# Patient Record
Sex: Male | Born: 1940 | Race: White | Hispanic: No | Smoking: Former smoker
Health system: Southern US, Community
[De-identification: ages and names within clinical notes are randomized; demographics above are authoritative.]

## PROBLEM LIST (undated history)

## (undated) DIAGNOSIS — I2510 Atherosclerotic heart disease of native coronary artery without angina pectoris: Secondary | ICD-10-CM

## (undated) DIAGNOSIS — I208 Other forms of angina pectoris: Secondary | ICD-10-CM

## (undated) DIAGNOSIS — E785 Hyperlipidemia, unspecified: Secondary | ICD-10-CM

## (undated) DIAGNOSIS — I2089 Other forms of angina pectoris: Secondary | ICD-10-CM

## (undated) DIAGNOSIS — I10 Essential (primary) hypertension: Secondary | ICD-10-CM

## (undated) DIAGNOSIS — Z8249 Family history of ischemic heart disease and other diseases of the circulatory system: Secondary | ICD-10-CM

## (undated) DIAGNOSIS — G4733 Obstructive sleep apnea (adult) (pediatric): Secondary | ICD-10-CM

## (undated) DIAGNOSIS — E119 Type 2 diabetes mellitus without complications: Secondary | ICD-10-CM

## (undated) HISTORY — DX: Atherosclerotic heart disease of native coronary artery without angina pectoris: I25.10

## (undated) HISTORY — DX: Hyperlipidemia, unspecified: E78.5

## (undated) HISTORY — DX: Essential (primary) hypertension: I10

## (undated) HISTORY — DX: Other forms of angina pectoris: I20.89

## (undated) HISTORY — DX: Type 2 diabetes mellitus without complications: E11.9

## (undated) HISTORY — DX: Family history of ischemic heart disease and other diseases of the circulatory system: Z82.49

## (undated) HISTORY — DX: Obstructive sleep apnea (adult) (pediatric): G47.33

## (undated) HISTORY — DX: Other forms of angina pectoris: I20.8

## (undated) HISTORY — PX: HERNIA REPAIR: SHX51

---
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
## 1967-06-24 HISTORY — PX: APPENDECTOMY: SHX54

## 1983-06-24 HISTORY — PX: CHOLECYSTECTOMY: SHX55

## 1999-02-18 ENCOUNTER — Encounter: Payer: Self-pay | Admitting: Cardiology

## 1999-02-18 ENCOUNTER — Ambulatory Visit (HOSPITAL_COMMUNITY): Admission: RE | Admit: 1999-02-18 | Discharge: 1999-02-18 | Payer: Self-pay | Admitting: Cardiology

## 1999-08-31 ENCOUNTER — Encounter: Payer: Self-pay | Admitting: Emergency Medicine

## 1999-08-31 ENCOUNTER — Emergency Department (HOSPITAL_COMMUNITY): Admission: EM | Admit: 1999-08-31 | Discharge: 1999-08-31 | Payer: Self-pay | Admitting: Emergency Medicine

## 2002-06-23 HISTORY — PX: TOTAL KNEE ARTHROPLASTY: SHX125

## 2005-01-27 HISTORY — PX: CARDIAC CATHETERIZATION: SHX172

## 2007-07-25 ENCOUNTER — Inpatient Hospital Stay (HOSPITAL_COMMUNITY): Admission: EM | Admit: 2007-07-25 | Discharge: 2007-07-26 | Payer: Self-pay | Admitting: Emergency Medicine

## 2008-02-18 ENCOUNTER — Ambulatory Visit: Payer: Self-pay | Admitting: Family Medicine

## 2008-02-21 ENCOUNTER — Encounter: Admission: RE | Admit: 2008-02-21 | Discharge: 2008-02-21 | Payer: Self-pay | Admitting: Family Medicine

## 2008-03-29 ENCOUNTER — Ambulatory Visit: Payer: Self-pay | Admitting: Family Medicine

## 2008-04-06 ENCOUNTER — Encounter (INDEPENDENT_AMBULATORY_CARE_PROVIDER_SITE_OTHER): Payer: Self-pay | Admitting: *Deleted

## 2008-04-12 ENCOUNTER — Ambulatory Visit: Payer: Self-pay | Admitting: Family Medicine

## 2008-04-12 ENCOUNTER — Ambulatory Visit: Payer: Self-pay | Admitting: Gastroenterology

## 2008-04-12 ENCOUNTER — Encounter: Payer: Self-pay | Admitting: Gastroenterology

## 2008-04-12 DIAGNOSIS — I2510 Atherosclerotic heart disease of native coronary artery without angina pectoris: Secondary | ICD-10-CM | POA: Insufficient documentation

## 2008-04-12 DIAGNOSIS — Z860100 Personal history of colon polyps, unspecified: Secondary | ICD-10-CM | POA: Insufficient documentation

## 2008-04-12 DIAGNOSIS — K432 Incisional hernia without obstruction or gangrene: Secondary | ICD-10-CM | POA: Insufficient documentation

## 2008-04-12 DIAGNOSIS — K859 Acute pancreatitis without necrosis or infection, unspecified: Secondary | ICD-10-CM

## 2008-04-13 ENCOUNTER — Ambulatory Visit (HOSPITAL_COMMUNITY): Admission: RE | Admit: 2008-04-13 | Discharge: 2008-04-13 | Payer: Self-pay | Admitting: Gastroenterology

## 2008-04-13 ENCOUNTER — Encounter: Payer: Self-pay | Admitting: Gastroenterology

## 2008-04-20 ENCOUNTER — Encounter: Payer: Self-pay | Admitting: Gastroenterology

## 2008-04-20 ENCOUNTER — Ambulatory Visit: Payer: Self-pay | Admitting: Gastroenterology

## 2008-04-21 ENCOUNTER — Encounter: Payer: Self-pay | Admitting: Gastroenterology

## 2008-04-27 ENCOUNTER — Inpatient Hospital Stay (HOSPITAL_COMMUNITY): Admission: RE | Admit: 2008-04-27 | Discharge: 2008-04-28 | Payer: Self-pay | Admitting: General Surgery

## 2008-04-30 ENCOUNTER — Emergency Department (HOSPITAL_COMMUNITY): Admission: EM | Admit: 2008-04-30 | Discharge: 2008-05-01 | Payer: Self-pay | Admitting: Emergency Medicine

## 2008-05-03 ENCOUNTER — Inpatient Hospital Stay (HOSPITAL_COMMUNITY): Admission: EM | Admit: 2008-05-03 | Discharge: 2008-05-08 | Payer: Self-pay | Admitting: Emergency Medicine

## 2008-05-03 HISTORY — PX: CARDIAC CATHETERIZATION: SHX172

## 2008-06-29 ENCOUNTER — Encounter (HOSPITAL_COMMUNITY): Admission: RE | Admit: 2008-06-29 | Discharge: 2008-09-27 | Payer: Self-pay | Admitting: Cardiology

## 2008-08-10 ENCOUNTER — Ambulatory Visit: Payer: Self-pay | Admitting: Family Medicine

## 2009-03-15 ENCOUNTER — Ambulatory Visit: Payer: Self-pay | Admitting: Family Medicine

## 2009-06-07 ENCOUNTER — Emergency Department (HOSPITAL_COMMUNITY): Admission: EM | Admit: 2009-06-07 | Discharge: 2009-06-07 | Payer: Self-pay | Admitting: Emergency Medicine

## 2009-06-08 ENCOUNTER — Emergency Department (HOSPITAL_COMMUNITY): Admission: EM | Admit: 2009-06-08 | Discharge: 2009-06-08 | Payer: Self-pay | Admitting: Emergency Medicine

## 2009-07-16 HISTORY — PX: CARDIOVASCULAR STRESS TEST: SHX262

## 2010-08-21 ENCOUNTER — Emergency Department (HOSPITAL_COMMUNITY): Payer: Medicare Other

## 2010-08-21 ENCOUNTER — Inpatient Hospital Stay (HOSPITAL_COMMUNITY)
Admission: EM | Admit: 2010-08-21 | Discharge: 2010-08-22 | DRG: 247 | Disposition: A | Discharge status: Home / Self Care | Payer: Medicare Other | Attending: Cardiovascular Disease | Admitting: Cardiovascular Disease

## 2010-08-21 DIAGNOSIS — I10 Essential (primary) hypertension: Secondary | ICD-10-CM | POA: Diagnosis present

## 2010-08-21 DIAGNOSIS — I200 Unstable angina: Principal | ICD-10-CM | POA: Diagnosis present

## 2010-08-21 DIAGNOSIS — E119 Type 2 diabetes mellitus without complications: Secondary | ICD-10-CM | POA: Diagnosis present

## 2010-08-21 DIAGNOSIS — E785 Hyperlipidemia, unspecified: Secondary | ICD-10-CM | POA: Diagnosis present

## 2010-08-21 DIAGNOSIS — I2510 Atherosclerotic heart disease of native coronary artery without angina pectoris: Secondary | ICD-10-CM | POA: Diagnosis present

## 2010-08-21 HISTORY — PX: CARDIAC CATHETERIZATION: SHX172

## 2010-08-21 LAB — CBC
MCH: 31 pg (ref 26.0–34.0)
Platelets: 287 10*3/uL (ref 150–400)
RBC: 5.38 MIL/uL (ref 4.22–5.81)
WBC: 7.6 10*3/uL (ref 4.0–10.5)

## 2010-08-21 LAB — POCT I-STAT, CHEM 8
Calcium, Ion: 1.28 mmol/L (ref 1.12–1.32)
HCT: 49 % (ref 39.0–52.0)
Hemoglobin: 16.7 g/dL (ref 13.0–17.0)
TCO2: 21 mmol/L (ref 0–100)

## 2010-08-21 LAB — DIFFERENTIAL
Basophils Absolute: 0 10*3/uL (ref 0.0–0.1)
Basophils Relative: 0 % (ref 0–1)
Eosinophils Absolute: 0.2 10*3/uL (ref 0.0–0.7)
Monocytes Relative: 5 % (ref 3–12)
Neutrophils Relative %: 77 % (ref 43–77)

## 2010-08-21 LAB — CK TOTAL AND CKMB (NOT AT ARMC)
Relative Index: INVALID (ref 0.0–2.5)
Total CK: 89 U/L (ref 7–232)

## 2010-08-21 LAB — TROPONIN I: Troponin I: 0.01 ng/mL (ref 0.00–0.06)

## 2010-08-21 LAB — HEPARIN LEVEL (UNFRACTIONATED): Heparin Unfractionated: 0.54 IU/mL (ref 0.30–0.70)

## 2010-08-21 LAB — CARDIAC PANEL(CRET KIN+CKTOT+MB+TROPI)
Relative Index: 1.6 (ref 0.0–2.5)
Total CK: 239 U/L — ABNORMAL HIGH (ref 7–232)
Troponin I: 0.02 ng/mL (ref 0.00–0.06)

## 2010-08-21 LAB — POCT CARDIAC MARKERS: Troponin i, poc: <0.05 ng/mL (ref 0.00–0.09)

## 2010-08-21 LAB — HEMOGLOBIN A1C: Mean Plasma Glucose: 163 mg/dL — ABNORMAL HIGH (ref <117)

## 2010-08-21 LAB — MAGNESIUM: Magnesium: 1.9 mg/dL (ref 1.5–2.5)

## 2010-08-22 LAB — CBC
Hemoglobin: 15.4 g/dL (ref 13.0–17.0)
MCH: 31.2 pg (ref 26.0–34.0)
MCV: 88.8 fL (ref 78.0–100.0)
RBC: 4.93 MIL/uL (ref 4.22–5.81)

## 2010-08-22 LAB — BASIC METABOLIC PANEL
BUN: 15 mg/dL (ref 6–23)
CO2: 25 mEq/L (ref 19–32)
Chloride: 102 mEq/L (ref 96–112)
Creatinine, Ser: 1.3 mg/dL (ref 0.4–1.5)

## 2010-08-22 LAB — LIPID PANEL
Total CHOL/HDL Ratio: 6.2 RATIO
VLDL: 53 mg/dL — ABNORMAL HIGH (ref 0–40)

## 2010-09-04 NOTE — Procedures (Signed)
NAME:  Joel Stanley, Joel Stanley NO.:  [id number]  MEDICAL RECORD NO.:  [id number]           PATIENT TYPE:  I  LOCATION:  2920                         FACILITY:  [facility]  PHYSICIAN:  Nanetta Batty, M.D.   DATE OF BIRTH:  1940/12/25  DATE OF PROCEDURE: DATE OF DISCHARGE:                           CARDIAC CATHETERIZATION   NAME OF PROCEDURE:  Cardiac catheterization/cutting balloon atherectomy, PCI and stenting.  Joel Stanley is a 70 year old gentleman with history of CAD with a known totally occluded LAD in the past.  He has had PLA stenting several years ago by Dr. Lavonne Chick.  He was admitted with unstable angina.  No acute EKG changes.  He was placed on IV nitro and presents now for diagnostic coronary arteriography to define his anatomy and rule out ischemic etiology.  DESCRIPTION OF PROCEDURE:  The patient was brought to the second floor [place] cardiac cath lab in a postabsorptive state.  He was premedicated with p.o. Valium.  His right groin was prepped and shaved in the usual sterile fashion.  Xylocaine 1% was used for local anesthesia.  A 5, upgraded to a 6-French sheath was inserted in the right femoral artery using standard Seldinger technique.  A 5-French right and left Judkins diagnostic catheter as well as 5-French pigtail catheter were used for selective coronary angiography, left ventriculography, and distal abdominal aortography.  Visipaque dye was used for the entirety of the case.  Retrograde aorta, left ventricular, and pullback pressures were recorded.  HEMODYNAMICS: 1. Aortic systolic pressure 126, diastolic pressure 80. 2. Left ventricular systolic pressure 120 and diastolic pressure 11.  SELECTIVE CORONARY ANGIOGRAPHY: 1. Left main normal; the LAD was totally occluded at its midportion     and reconstituted distally by homo-collaterals. 2. Left circumflex; nondominant and free of significant disease. 3. Ramus branch; moderate-to-large in  size and free of significant     disease. 4. Right coronary artery; dominant with a patent PLA stent.  There was     a 75% fairly focal in-stent restenosis within its midportion.  The     PDA had a fairly focal 90% proximal stenosis. 5. Left ventriculography; RAO left ventriculogram was performed using     25 mL of Visipaque dye at 12 mL per second.  The overall LVEF was     estimated at approximately 45% with moderate inferobasal     hypokinesia. 6. Distal abdominal aortography; distal abdominal aortogram was     performed using 20 mL of Visipaque dye at 20 mL per second.  The     renal arteries were widely patent.  The infrarenal abdominal aorta     and iliac bifurcation appear free of significant atherosclerotic     changes.  IMPRESSION:  Joel Stanley has a focal area of in-stent restenosis within the PLA stent and a new PDA lesion.  We will proceed with cutting balloon atherectomy of the in-stent restenosis and stenting of the PDA.  DESCRIPTION OF PROCEDURE:  The 5-French sheath was upgraded to a 6- [place] sheath over a wire.  The patient was on aspirin and Plavix, received an additional 300 mg of p.o. Plavix,  20 mg of Pepcid, and Angiomax bolus with an ACT of 470.  Using a 6-French JR-4 guide along with 0.014 x 190 Prowater and a 2510 angioscope atherectomy device, cutting balloon atherectomy was performed of the PLA area of in-stent restenosis.  This was expanded to 10 atmospheres, resulting in reduction of a 75% to less than 20% without dissection.  The wire was then withdrawn and redirected down the PDA.  A Promus Elment stent was unable to pass the mid right coronary artery, and therefore, a sized soft wire was then placed in the PLA branch and used a "a buddy wire."  Following this, a 2.25 x 12  Promus Element stent was then advanced without difficulty into the PDA branch after predilatation with a 2010 FireStar.  This was then deployed at 14 atmospheres (2.36 mm,  resulting in reduction of a 90% blockage to 0% residual).  The patient tolerated the procedure well.  There were no hemodynamic or electrocardiographic sequelae.  The guidewire and catheters were removed, and the sheath was then sewn securely in place.  The patient left the lab in stable condition.  OVERALL IMPRESSION:  Successful cutting balloon atherectomy of the PLA area of "in-stent restenosis" and PCI and stenting of a new PDA lesion using a Promus Element drug-eluting stent and Angiomax.  Patient tolerated the procedure well.  He will be hydrated gently overnight, discharged home in the morning on aspirin and Plavix, and follow up with Dr. Clarene Duke.  He left the lab in stable condition.     Nanetta Batty, M.D.     JB/MEDQ  D:  08/21/2010  T:  08/22/2010  Job:  [id number]  cc:   Thereasa Solo. Little, M.D. Cardiac Cath Lab, II Floor [facility]  Electronically Signed by Nanetta Batty M.D. on 09/04/2010 01:58:09 PM

## 2010-09-04 NOTE — Discharge Summary (Signed)
NAME:  Joel Stanley, Joel Stanley NO.:  [id number]  MEDICAL RECORD NO.:  [id number]           PATIENT TYPE:  I  LOCATION:  2920                         FACILITY:  [facility]  PHYSICIAN:  Nanetta Batty, M.D.   DATE OF BIRTH:  02/01/1941  DATE OF ADMISSION:  08/21/2010 DATE OF DISCHARGE:  08/22/2010                              DISCHARGE SUMMARY   DISCHARGE DIAGNOSES: 1. Unstable angina with negative myocardial infarction. 2. 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 The patient's EF was 45%. 1. Left ventricular dysfunction, EF 45%. 2. Hypertension controlled. 3. Dyslipidemia, stable. 4. Diet-controlled diabetes mellitus type 2.  HISTORY OF PRESENT ILLNESS:  A 70 year old male presented to the emergency room on August 21, 2010, after he developed chest pain the night previous around 9:30.  He had eaten vegetable soup.  Originally, the pain was a 7/10 on a 1-10 scale in intensity, radiated to the right side of his neck and was eased with sublingual nitro, though initially took 10-15 Tums with no relief.  He did state it was similar to previous cardiac pain.  He did note nausea, vomiting, and diaphoresis, and had cold symptoms recently and also occasional right lower extremity edema. He had also had increasing anginal symptoms in the last 6 months prior to the admission.  When he came to the emergency room, was started on IV fluids, IV nitro, IV heparin, and continued on his 75 mg of Plavix daily.  He was admitted to step down and then underwent left heart cath with Dr. Nanetta Batty, and was found to have a new stenosis in the PDA undergoing PTCA with a drug-eluting stent and in-stent restenosis to the previously placed Promus drug-eluting stent to the PLA undergoing cutting  balloon atherectomy.  By the next morning, the patient is stable.  He has no complaints.  Cardiac enzymes were negative.  And once he ambulates with cardiac rehab, he will be discharged home, and follow up with Dr. Clarene Duke.  DISCHARGE MEDICATIONS:  See medication reconciliation sheet.  The only new medications added nitroglycerin sublingual and the Pepcid 20 mg.  DISCHARGE INSTRUCTIONS: 1. Increase activity slowly.  May shower.  No lifting for 3 days.  No     driving for 2 days.  Low-sodium heart-healthy diabetic diet. 2. Wash cath site with soap and water.  Call if any bleeding,     swelling, or drainage. 3. Follow up with Dr. Clarene Duke on September 06, 2010, at 9:00 a.m.  LABORATORY DATA:  Hemoglobin at discharge 15.4, hematocrit 43.8, WBC 7.8, platelets 247.  Protime was 13.4, INR of 1, and heparin was 0.54 and was on heparin.  Glucose had been elevated at 178.  Chemistry, sodium 136, potassium 4, chloride 102, CO2 of 25, glucose 170, BUN 15, creatinine 1.30, calcium 9.1, magnesium 1.9.  Hemoglobin A1c was 7.3.  CK range was elevated.  The MBs and the troponins  were all negative.  CK ranged 89-267, but MBs 1.7, 2.8, and 2.6, troponin I was 0.01-0.02.  Total cholesterol 174, triglycerides 264, HDL 28, LDL 93.  TSH 0.729 and MRSA screen was negative.  RADIOLOGY:  Chest x-ray, cardiomegaly, bibasilar atelectasis, stable chronic interstitial lung disease.  EKG on August 21, 2010, sinus rhythm, rate 83, and also interventricular conduction delay.  Follow up on August 22, 2010, sinus rhythm, rate of 75, minimal voltage criteria for LVH, otherwise no acute changes.     Darcella Gasman. Annie Paras, N.P.   ______________________________ Nanetta Batty, M.D.    LRI/MEDQ  D:  08/22/2010  T:  08/23/2010  Job:  [id number]  cc:   [facility] Sharlot Gowda, M.D.  Electronically Signed by Nada Boozer N.P. on 08/23/2010 05:05:37 PM Electronically Signed by Nanetta Batty M.D. on 09/04/2010  01:58:06 PM

## 2010-09-23 LAB — CBC
Hemoglobin: 16.2 g/dL (ref 13.0–17.0)
RBC: 5.27 MIL/uL (ref 4.22–5.81)
RDW: 12.4 % (ref 11.5–15.5)

## 2010-09-23 LAB — PROTIME-INR: INR: 0.99 (ref 0.00–1.49)

## 2010-09-23 LAB — DIFFERENTIAL
Basophils Absolute: 0 10*3/uL (ref 0.0–0.1)
Lymphocytes Relative: 25 % (ref 12–46)
Monocytes Absolute: 0.5 10*3/uL (ref 0.1–1.0)
Monocytes Relative: 6 % (ref 3–12)
Neutro Abs: 4.5 10*3/uL (ref 1.7–7.7)

## 2010-10-03 LAB — GLUCOSE, CAPILLARY: Glucose-Capillary: 104 mg/dL — ABNORMAL HIGH (ref 70–99)

## 2010-10-07 LAB — GLUCOSE, CAPILLARY
Glucose-Capillary: 208 mg/dL — ABNORMAL HIGH (ref 70–99)
Glucose-Capillary: 214 mg/dL — ABNORMAL HIGH (ref 70–99)
Glucose-Capillary: 273 mg/dL — ABNORMAL HIGH (ref 70–99)
Glucose-Capillary: 287 mg/dL — ABNORMAL HIGH (ref 70–99)

## 2010-10-08 LAB — GLUCOSE, CAPILLARY
Glucose-Capillary: 128 mg/dL — ABNORMAL HIGH (ref 70–99)
Glucose-Capillary: 164 mg/dL — ABNORMAL HIGH (ref 70–99)
Glucose-Capillary: 204 mg/dL — ABNORMAL HIGH (ref 70–99)
Glucose-Capillary: 207 mg/dL — ABNORMAL HIGH (ref 70–99)
Glucose-Capillary: 247 mg/dL — ABNORMAL HIGH (ref 70–99)

## 2010-11-05 NOTE — Cardiovascular Report (Signed)
NAMEMarland Kitchen  ROBEL, WUERTZ NO.:  [id number]   MEDICAL RECORD NO.:  [id number]          PATIENT TYPE:  INP   LOCATION:  2907                         FACILITY:  [facility]   PHYSICIAN:  Madaline Savage, M.D.DATE OF BIRTH:  July 19, 1940   DATE OF PROCEDURE:  05/03/2008  DATE OF DISCHARGE:                            CARDIAC CATHETERIZATION   PROCEDURES PERFORMED:  1. Selective coronary angiography by Judkins technique.  2. Retrograde left heart catheterization.  3. Left ventricular angiography.  4. Intracoronary artery balloon angioplasty followed by stenting of      the proximal and mid posterolateral branch of the right coronary      artery.   COMPLICATIONS:  None.   ENTRY SITE:  Right femoral.   DYE USED:  Omnipaque.   MEDICATIONS GIVEN:  The patient was given Plavix during the case and the  majority of the case was performed with Angiomax.  We later started  Integrilin because we then planned to continue Plavix for an additional  12 hours.   PATIENT PROFILE:  The patient is a 70 year old cardiology patient of Dr.  Julieanne Manson, who recently had need for abdominal surgery for hernia.  His surgeon got clearance for him to have the procedure done and his  Plavix was held.  The patient has a history of having had a Taxus stent  3 years ago.  Today, the patient developed an acute ST-segment elevation  MI and presented to our emergency room and was brought to the cath lab  emergently for intervention.  During the procedure, we encountered a  number of technical difficulties because of the anatomy of the RCA, but  we ultimately achieved a very satisfactory result, more discussion  below.   PRESSURES:  Left ventricular pressure was 112/60, end-diastolic pressure  12.  Central aortic pressure 115/71 with a mean of 91.   ANGIOGRAPHIC RESULTS:  The left main coronary artery is a medium-sized  vessel with no significant stenoses, normal in appearance.   Left anterior  descending coronary artery is occluded just distal to a  large septal perforator branch, and there is collateral flow into the  distal LAD by way of multiple branches including diagonal collateral  flow and probably some circumflex obtuse marginal branch collateral  flow.   The circumflex coronary artery is basically a nondominant vessel.  The  circumflex is a nondominant vessel.  It gives rise to one obtuse  marginal branch fairly proximally, an atrial circumflex branch, and a  small distal posterolateral branch.  There are lumpy bumpy  irregularities throughout this vessel, but none that are hemodynamically  significant.   I failed to mention that the patient has an intermediate ramus versus  optional diagonal branch coming off the proximal LAD before a septal  perforator branch #1.   The RCA is a large and dominant vessel with circulation, proximally it  is 5 mm in diameter in the downgoing limb of it where there is a  radiopaque stent adjacent to the first acute marginal branch, there is a  vessel appears to be about 4.5 mm in diameter and the stents that are  placed there are widely patent.  There is another stent that will be in  the proximal portion of the distal RCA that is patent, but unfortunately  there has been jailing and/or acute thrombosis of a posterolateral  branch arising from the distal bifurcation of the RCA.   Left ventricular angiography was done at the end of the case and showed  fairly well-preserved LV systolic function with anterior wall motion  being normal, apical wall motion being normal, and inferoapical wall  motion being good.  There is a definite area two-thirds the profile of  the inferior wall that shows hypokinesis of moderate-to-severe degree,  which would represent the myocardial infarction the patient suffered  today.  I do not see any mitral regurgitation.   The percutaneous intervention on this vessel was a complicated to fair  that took  proximally an hour and half to complete.  The guide catheter  used was an Cordis XB RCA guide catheter.  Multiple wires were used  during this case, but the wire that ultimately proved to be fruitful in  restoring blood flow was a Prowater wire, along the way several other  wires had been used.  The part of this intervention that took so long  was getting a guidewire that would adequately fit the ostium of the  posterolateral branch where there was thought to be a small stent  placed.  After finally getting the vessel wired in a satisfactory  manner, I was then able to advance a Promus stent across the stenotic  area that was 100% occluded initially and after this Promus stent was  deployed, we saw a lot of thrombus despite Angiomax being infused.  We  used a 2.75 x 28 mm Promus stent deployed to 14 atmospheres of pressure  and we got a very satisfactory results.  There was some residual  thrombus in the distal posterolateral branch, but not disconcerting  amount of thrombus, and we feel that the continuation of Angiomax will  be helpful.   FINAL IMPRESSION:  1. Acute ST-segment elevation inferior.  2. Previous Taxus stent placed in ostium and posterior descending      branch of right coronary artery approximately 3 years ago.  3. Left ventricular systolic function, ejection fraction estimated      45%.  4. Successful deployment of a Promus stent with reduction of a 100%      occluded proximal posterolateral branch reduced to 0% residual with      TIMI 3 flow.           ______________________________  Madaline Savage, M.D.     WHG/MEDQ  D:  05/03/2008  T:  05/03/2008  Job:  [id number]   cc:   Thereasa Solo. Little, M.D.

## 2010-11-05 NOTE — Consult Note (Signed)
NAME:  Joel Stanley, Joel Stanley NO.:  [id number]   MEDICAL RECORD NO.:  [id number]          PATIENT TYPE:  INP   LOCATION:                               FACILITY:  [facility]   PHYSICIAN:  Maretta Bees. Vonita Moss, M.D.DATE OF BIRTH:  1941/02/16   DATE OF CONSULTATION:  05/06/2008  DATE OF DISCHARGE:                                 CONSULTATION   I was asked to see this gentleman by nurse practitioner for Dr. Elsie Lincoln  for evaluation of a urinary retention.  This gentleman has a history of  nocturia times one and fairly good stream until he underwent a hernia  repair on April 27, 2008, and he had to come back to the emergency  room in urinary retention.  He then was admitted to this hospital for  coronary artery disease and has undergone angioplasty and stenting and  has failed a voiding trial again.  There have been no problems in  inserting his Foley catheter.  He has had no history of prostate surgery  or prostatitis.   Past history includes idiopathic pancreatitis, and he has also had  hypertension, hyperlipidemia, and esophageal reflux.   Besides his hernia repair, he has had a cholecystectomy, tonsillectomy,  appendectomy, and left knee replacement.   Admission medications included Lopressor, Celebrex, Crestor, and he was  on Plavix.  He also takes Zantac and aspirin.   ALLERGIES:  He denied.   He is retired and he is divorced.   He does not smoke.   PHYSICAL EXAMINATION:  GENERAL:  On examination, he is alert and  oriented.  In no acute distress.  SKIN:  Warm and dry.  ABDOMEN:  Soft, nontender.  GENITOURINARY:  Penis, urethral meatus, scrotum, testicles, and  epididymis unremarkable except for a Foley catheter in place.  Prostate  feels benign, smooth, and not terribly large.   IMPRESSION:  1. Mild bladder neck contracture.  2. Acute urinary retention postop hernia and myocardial infarction.   PLAN:  Start Flomax daily, and I think he can undergo voiding trial  tomorrow with in and out cath p.r.n.  I will follow this gentleman with  you.      Maretta Bees. Vonita Moss, M.D.  Electronically Signed     LJP/MEDQ  D:  05/06/2008  T:  05/06/2008  Job:  [id number]   cc:   Madaline Savage, M.D.  Sharlot Gowda, M.D.

## 2010-11-05 NOTE — Discharge Summary (Signed)
NAME:  Joel Stanley, Joel Stanley NO.:  [id number]   MEDICAL RECORD NO.:  [id number]          PATIENT TYPE:  INP   LOCATION:  3735                         FACILITY:  [facility]   PHYSICIAN:  Darcella Gasman. [place], N.P.  DATE OF BIRTH:  11-Nov-1940   DATE OF ADMISSION:  07/25/2007  DATE OF DISCHARGE:  07/26/2007                               DISCHARGE SUMMARY   DISCHARGE DIAGNOSES:  1. Pancreatitis, resolved.  2. Coronary disease with prior MIs and multiple interventions.  3. Recent negative stress Myoview done July 19, 2007.  4. EF 67%.  5. Gastroesophageal reflux disease.   CONDITION ON DISCHARGE:  Improved   PROCEDURES:  None.   DISCHARGE MEDICATIONS:  1. Enteric coated aspirin 81mg  daily.  2. Metoprolol 50 mg b.i.d.  3. Plavix 75 mg daily.  4. Lisinopril 55 mg daily.  5. Crestor 10 mg daily, hold.  6. Niaspan 500 30 minutes after aspirin, hold.  7. Celebrex 200 mg daily.  8. Prilosec 20 mg over-the-counter daily.   DISCHARGE INSTRUCTIONS:  1. Activity:  Increase activity slowly.  2. Low-fat mild food tonight and tomorrow and please see Dr. Clarene Duke.  3. Follow up with Dr. Clarene Duke July 27, 2007, at 11:15 a.m.   HISTORY OF PRESENT ILLNESS:  A 70 year old gentleman with know coronary  disease with interventions and prior MIs, also, strong family history of  coronary disease.  He also has reflux disease.  He had a treadmill  Myoview on January 26 that was negative, and EF was 67%.  On July 25, 2007, he had presented to the emergency room because he developed  indigestion-type pain and nausea and vomiting while hanging sheet rock.  That discomfort actually started on January 31.  It was similar to his  prior MI with the pain, but the chest pain this time was less severe and  it was in the past associated with severe diaphoresis and shortness of  breath, but was not this time.  He had no syncope, no palpitations.  He  felt it was more chest pain than gastric discomfort.   Initially, it  began as an 8/10 discomfort and dropped to a 2/10 and EMS arrived.  There was yellow vomitus.  No blood in that.  Otherwise, the patient had  been doing well prior to that ,occasional exertional chest tightness  relieved with brief periods of rest when it had been chronic.   ALLERGIES:  No known drug allergies.   MEDICATIONS:  Outpatient medications are the same as discharge, except  he was at that time taking Viagra but had not used it 72 hours prior to  coming in.   PAST MEDICAL HISTORY:  1. Normal LV function.  2. Gastroenteritis at the time.  3. Strong family history of coronary disease.  4. Remote GI bleed in the past.  5. Arthritis.  6. Tonsillectomy.  7. Appendectomy.  8. Cholecystectomy.  9. Left knee replacement.  10.Hernia repair times two.   SOCIAL HISTORY:  Lives alone, quit tobacco 30 years ago, rare alcohol  use.  He is retired.   PHYSICAL EXAMINATION:  VITAL SIGNS:  Blood pressure 101/68, pulse 64,  respirations 20, temperature 98.3, oxygen saturation 97%.  HEART:  Regular rate and rhythm.  LUNGS:  Clear.  ABDOMEN:  Positive bowel sounds, nontender.  EXTREMITIES:  Without edema.   LABORATORY DATA:  Hemoglobin at discharge 14.4, hematocrit 41.6,  platelets 192, WBC 4.7.  Chemistry:  Sodium 138, potassium 4.3, chloride  106, CO2 27, BUN 19, creatinine 1.26, glucose 128.  Heparin was  therapeutic.  Initial LFTs:  AST 23, ALT 27, alk-phos 49, total  bilirubin 1.0, amylase 175, lipase 34, albumin 3.6.  Follow up the next  day after n.p.o., AST 21, ALT 26, alk-phos 21, total bilirubin 1.1,  amylase 56, lipase 19, albumin 3.3.  Cardiac enzymes were all negative.  CK MB's ranged 113, 105, 142, 151, MBs 1.3-1.4, troponin I's were all  negative at 0.01-0.02.   Total cholesterol 108, LDL 53, HDL 27, triglycerides 141, calcium 8, TSH  0.539.  Stool for occult blood was negative.  Chest x-ray on admission,  low lung volumes with basilar atelectasis.   EKG:  Non-sinus rhythm, left  anterior fascicular block, and he was originally tachycardic when he  came in at 1:18 by the next morning or later that morning, heart rate  was at 95.   HOSPITAL COURSE:  Mr. Gullion was admitted by Dr. Jenne Campus on July 25, 2007, after presenting with chest discomfort described as indigestion-  type pain and nausea and vomiting because the amylase was significantly  elevated at 175.  He was kept n.p.o., IV fluids were given.  By the next  morning, amylase had dropped, and he was without pain and no complaints  at all.  We started with clear liquids.  He tolerated that in the  morning, and full liquids for lunch without further complaints.  Dr.  Jenne Campus saw him late in the day on July 26, 2007, and felt he  was stable for discharge.  He does have an appointment with Dr.  Clarene Duke  on July 27, 2007, at 11:15 which he will keep.  Dr. Clarene Duke may wish  to recheck labs at that time as well.  The patient ambulated without  problems and would follow up as instructed.      Darcella Gasman. Annie Paras, N.P.     LRI/MEDQ  D:  07/26/2007  T:  07/26/2007  Job:  [id number]   cc:   Thereasa Solo. Little, M.D.

## 2010-11-05 NOTE — Discharge Summary (Signed)
NAME:  Joel Stanley, Joel Stanley NO.:  [id number]   MEDICAL RECORD NO.:  [id number]          PATIENT TYPE:  INP   LOCATION:  1533                         [facility]   PHYSICIAN:  Juanetta Gosling, MDDATE OF BIRTH:  June 16, 1941   DATE OF ADMISSION:  04/27/2008  DATE OF DISCHARGE:  04/28/2008                               DISCHARGE SUMMARY   HISTORY:  [name] is a 70 year old male with a bulge in his  abdominal wall at the site of a prior open cholecystectomy, who also has  a long history of hypertension, coronary artery disease, for which he  had 4 stents placed previously.  He was on Plavix and aspirin.  He  received cardiac clearance prior to going to the operating room and was  taken to the operating room on April 27, 2008, where he underwent a  laparoscopic lysis of adhesions x30 minutes, followed by a laparoscopic  ventral hernia repair with a 15 x 20 cm Proceed that was without  complication.   Postoperatively, his diet was advanced to a regular diet, which he was  tolerating well.  He was ambulating and voiding, and his pain was under  good control the following morning.  He was discharged home.  Additionally, there was a question that there was a whitish area near  his arytenoids on intubation that the anesthesiologist reviewed and  found this to be just the arytenoids.   DISCHARGE MEDICATIONS:  His normal home medications as well as Vicodin  5/500 every 6 hours as needed.   DISCHARGE INSTRUCTIONS:  Increase activity slowly.  No lifting for 4  weeks.  He may shower upon arrival at home.  He was to follow up with me  in one week status post discharge.   He was stable upon discharge.  He was discharged to home.   FINAL DIAGNOSIS:  Status post left ventral hernia repair.      Juanetta Gosling, MD  Electronically Signed     MCW/MEDQ  D:  05/17/2008  T:  05/17/2008  Job:  [id number]

## 2010-11-05 NOTE — Op Note (Signed)
NAME:  Joel Stanley, Joel Stanley NO.:  [id number]   MEDICAL RECORD NO.:  [id number]          PATIENT TYPE:  INP   LOCATION:  0001                         FACILITY:  [facility]   PHYSICIAN:  Juanetta Gosling, MDDATE OF BIRTH:  April 29, 1941   DATE OF PROCEDURE:  04/27/2008  DATE OF DISCHARGE:                               OPERATIVE REPORT   PREOPERATIVE DIAGNOSIS:  Ventral hernia repair status post open  cholecystectomy.   POSTOPERATIVE DIAGNOSIS:  Ventral hernia repair status post open  cholecystectomy.   PROCEDURE:  Laparoscopic lysis of adhesions for 30 minutes, then  laparoscopic ventral hernia repair with 15 x 20-cm Proceed mesh.   SURGEON:  Dr. Dwain Sarna.   ASSISTANT:  Dr. Lindie Spruce.   ANESTHESIA:  General.   FINDINGS:  A 5 x 5-cm defect at the medial aspect of his open  cholecystectomy incision.   SPECIMENS:  None.   DRAINS:  None.   ESTIMATED BLOOD LOSS:  Minimal.   COMPLICATIONS:  None.   DISPOSITION:  To PACU in stable condition.   INDICATIONS:  Joel Stanley is a 70 year old male who presents with a  chief complaint of a bulge in his abdominal wall that has been  increasing in size over time and occasionally causes him some pain.   PAST MEDICAL HISTORY:  Includes hypertension and coronary artery disease  which he has history 4 stents.   SURGICAL HISTORY:  Includes a right groin hernia, umbilical hernia, open  cholecystectomy and appendectomy as well as a right total knee  replacement.   PHYSICAL EXAMINATION:  He had multiple well healing incisions.  His  transverse cholecystectomy scar is well healed.  In the medial aspect,  he had a reducible hernia.  He had been cleared by his cardiologist, Dr.  Clarene Duke, prior to beginning the operation and was off of his Plavix for 7  days prior to his operation.   PROCEDURE:  After informed consent was obtained, the patient was taken  to the operating room.  He was administered 1 gram of intravenous Ancef.  He  was then placed under general endotracheal anesthesia without  complication.  Upon being intubated, the anesthetist had noticed a  whitish area on the right side near his arytenoids that she was  concerned about that we will need to get followed up by ENT  postoperatively.  We were unable to find this area later when he was  extubated, just because he was coughing.  Sequential compression devices  were placed on his lower extremities.  His abdomen was then prepped and  draped in standard sterile surgical fashion.  Ioban band was placed over  his abdomen.  Surgical time-out was then performed.  A Foley catheter  had also been inserted as well and orogastric tube was in place.  Following this, a 5-mm incision was then made in the left upper quadrant  and OptiVu with a 5-mm trocar was then used to enter the abdomen without  difficulty.  The abdomen was then insufflated to 15 mmHg pressure  without complication.  Upon doing this, he was noted to have his omentum  adherent to his  entire cholecystectomy incision, but the remainder of  his abdomen appeared to be free.  Local anesthetic was infiltrated into  2 other places on the left side of his abdomen.  In his left mid  abdomen, a 10-mm trocar was placed, and in his left lower quadrant, a 5-  mm trocar was placed under direct vision without complication.  Following this, a 10-mm camera was then used, and a combination of blunt  dissection, some electrocautery, when it was very clear that this was  just omentum as well as some Ligamax clips to control some vessels were  used to reduce the hernia as well as to take down the adhesions.  This  was done without complication and took approximately 30 minutes to  reduce all of these adhesions.  When this had been cleared off, he had a  defect that measured about 5 x 5 cm in length.  I also took down the  falciform ligament.  Following this, I chose to use a 15 x 20-cm piece  of Proceed mesh.  Four  cardinal stitches of 0 Prolene were placed in  this mesh.  Then, this was inserted through the 10-mm port.  This was  then laid flat.  The stitches were then measured out, and four stab  incisions were then made, and the Endoclose device was then used to pull  the stitches up and line the mesh in good position.  All of the sutures  were tied down.  The ProTack was then used to tack the entire edge of  the mesh down as well as several in the middle to tack this up.  Following this, I did put 4 additional 0 Prolene sutures in using the  Storz suture passer, bisecting all the other previous sutures, making  the distance approximately 4 cm between all of his transfascial sutures.  These were done under direct vision without complication.  Following  this, hemostasis was observed.  The mesh was in good position and  covered the hernia defect with a 5-cm overlap in each direction.  I then  removed the 10-mm port, used the Endoclose and a 0 Vicryl to close the  fascia of that port site.  I then removed the camera and desufflated the  abdomen and then removed all ports.  Following this, I closed the larger  incisions with 4-0 Monocryl, and then Dermabond was used for all the  stab incisions as well as port sites.  He tolerated this well.  His  Foley catheter was removed in the operating room, and he was transferred  to the recovery room in stable condition.      Juanetta Gosling, MD  Electronically Signed     MCW/MEDQ  D:  04/27/2008  T:  04/27/2008  Job:  [id number]   cc:   Thereasa Solo. Little, M.D.  Fax: [phone]   Sharlot Gowda, M.D.  Fax: [phone]

## 2010-11-05 NOTE — Discharge Summary (Signed)
NAME:  Joel Stanley, Joel Stanley NO.:  [id number]   MEDICAL RECORD NO.:  [id number]          PATIENT TYPE:  INP   LOCATION:  4707                         FACILITY:  [facility]   PHYSICIAN:  Thereasa Solo. Little, M.D. DATE OF BIRTH:  [birth year]   DATE OF ADMISSION:  05/03/2008  DATE OF DISCHARGE:  05/08/2008                               DISCHARGE SUMMARY   DISCHARGE DIAGNOSES:  1. ST-elevation myocardial infarction on admission treated with urgent      posterolateral PROMUS stenting on May 03, 2008.  2. History of coronary artery disease with prior Taxus stent placement      in August 2006 and remote myocardial infarction 10 years prior, the      patient had been off Plavix for recent abdominal surgery on      April 27, 2008.  3. Status post recent ventral hernia repair on April 27, 2008.  4. Urinary retention, this began actually after his ventral hernia      repair on April 27, 2008, and he had recurrent problems with this      this admission, he will follow up with Dr. Vonita Moss.  5. Preserved left ventricular function.  6. Treated dyslipidemia.   HOSPITAL COURSE:  Mr. Gilliam is a 70 year old male followed by Dr.  Clarene Duke and Dr. Susann Givens who has a history of coronary artery disease.  He  had a remote MI 10 years ago.  In August 2006, he had an acute MI and  RCA TAXUS stent was placed.  At that time, he did have total LAD with  collaterals.  He had a low-risk Myoview on July 19, 2007, with an EF  of 67%.  He had a ventral hernia repair and open cholecystectomy on  April 27, 2008, by Dr. Dwain Sarna along with lysis of adhesions.  He  was off Plavix for 7 days prior to this procedure.  The patient was  recovering and back on Plavix when he developed unstable angina symptoms  on May 03, 2008, with chest pain, which radiated down his left arm  and into his neck and jaw.  His EKG showed ST elevation in inferior  leads.  He was seen by Dr. Elsie Lincoln and taken  urgently to the Cath Lab.  Catheterization showed a totally occluded PLA.  He underwent  intervention of this site with a new PROMUS stent placement.  Dr. Elsie Lincoln  was able to achieve restoration of flow.  His CKs went to 900 with 98  MBs.  The patient was watched in the CCU.  He did have a run of  nonsustained wide complex tachycardia that was self-limited on May 04, 2008.  His beta-blocker was gently increased.  He was mildly  hypotensive at the time of his MI.  He was seen in consult by the  Surgical Service.  They have recommended a course of antibiotics  prophylactically to prevent the wound site infection.  We tried to  discontinue his Foley, but the patient was unable to tolerate this.  He  was seen in consult by Dr. Vonita Moss on May 06, 2008.  We will try  again at discharge to discontinue his Foley.  If he still has a large  amount of residual, we will need to place it back and have him followup  with Dr. Vonita Moss with a Foley in place.  Either way, the patient will  follow up with Dr. Vonita Moss.  The patient is ambulated without problems.  He still looked somewhat mildly hypotensive.   DISCHARGE MEDICATIONS:  1. Zantac 150 mg daily.  2. Aspirin 81 mg 2 tablets a day.  3. Metoprolol 50 mg one-half tablet twice a day.  4. Plavix 75 mg a day.  5. Lisinopril 2.5 mg a day.  6. Crestor 10 mg a day.  7. Celebrex p.r.n.  8. Flomax 0.4 mg a day.  9. Augmentin 875 mg twice a day for 5 days.  10.Nitroglycerin sublingual p.r.n.   LABORATORY DATA:  White count 7.7, hemoglobin 13.8, hematocrit 40,  platelets 337.  Sodium 35, potassium 4.9, BUN 22, creatinine 1.4.  Hemoglobin A1c is 7.1.  TSH is 0.86.  Urinalysis is unremarkable.  CKs  peaked on presentation at 14, 89 with 91 MBs.  Chest x-ray shows  cardiomegaly with chronic interstitial changes.  A urine culture shows  no growth.   DISPOSITION:  The patient was discharged in stable condition.  He will  follow up with Dr.  Clarene Duke.  His EKG discharge shows sinus rhythm with  inferior Qs.      Abelino Derrick, P.A.    ______________________________  Thereasa Solo. Little, M.D.    Lenard Lance  D:  05/08/2008  T:  05/08/2008  Job:  [id number]   cc:   Maretta Bees. Vonita Moss, M.D.  Juanetta Gosling, MD

## 2011-03-14 LAB — LIPID PANEL
Cholesterol: 108
HDL: 27 — ABNORMAL LOW
Total CHOL/HDL Ratio: 4
Triglycerides: 141

## 2011-03-14 LAB — COMPREHENSIVE METABOLIC PANEL
ALT: 26
ALT: 27
AST: 21
Albumin: 3.3 — ABNORMAL LOW
Albumin: 3.6
Alkaline Phosphatase: 41
BUN: 25 — ABNORMAL HIGH
Calcium: 8.3 — ABNORMAL LOW
Chloride: 106
Creatinine, Ser: 1.26
GFR calc Af Amer: >60
Glucose, Bld: 161 — ABNORMAL HIGH
Potassium: 4.1
Potassium: 4.3
Sodium: 138
Sodium: 139
Total Bilirubin: 1.1
Total Protein: 6.3

## 2011-03-14 LAB — POCT CARDIAC MARKERS
CKMB, poc: <1 — ABNORMAL LOW
CKMB, poc: <1 — ABNORMAL LOW
Myoglobin, poc: 162
Myoglobin, poc: 95.5
Operator id: 282201
Operator id: 294511
Troponin i, poc: <0.05

## 2011-03-14 LAB — I-STAT 8, (EC8 V) (CONVERTED LAB)
BUN: 26 — ABNORMAL HIGH
Bicarbonate: 24.9 — ABNORMAL HIGH
Chloride: 104
Glucose, Bld: 195 — ABNORMAL HIGH
HCT: 51
pCO2, Ven: 43.6 — ABNORMAL LOW
pH, Ven: 7.365 — ABNORMAL HIGH

## 2011-03-14 LAB — LIPASE, BLOOD: Lipase: 34

## 2011-03-14 LAB — CBC
HCT: 48.2
MCHC: 34.5
MCV: 91.8
Platelets: 259
RBC: 4.53
RDW: 13
RDW: 13.4
WBC: 7.1

## 2011-03-14 LAB — AMYLASE: Amylase: 175 — ABNORMAL HIGH

## 2011-03-14 LAB — CK TOTAL AND CKMB (NOT AT ARMC)
CK, MB: 1.4
CK, MB: 1.5
Relative Index: 0.9
Relative Index: 1.1
Relative Index: 1.3
Total CK: 113
Total CK: 142

## 2011-03-14 LAB — TROPONIN I: Troponin I: <0.01

## 2011-03-14 LAB — HEPARIN LEVEL (UNFRACTIONATED): Heparin Unfractionated: <0.1 — ABNORMAL LOW

## 2011-03-14 LAB — OCCULT BLOOD X 1 CARD TO LAB, STOOL: Fecal Occult Bld: NEGATIVE

## 2011-03-25 LAB — URINALYSIS, ROUTINE W REFLEX MICROSCOPIC
Bilirubin Urine: NEGATIVE
Glucose, UA: NEGATIVE
Hgb urine dipstick: NEGATIVE
Specific Gravity, Urine: 1.019
pH: 6.5

## 2011-03-25 LAB — COMPREHENSIVE METABOLIC PANEL
Albumin: 3.9
Alkaline Phosphatase: 65
BUN: 17
Creatinine, Ser: 1.25
Potassium: 4.1
Total Protein: 6.9

## 2011-03-25 LAB — GLUCOSE, CAPILLARY
Glucose-Capillary: 214 — ABNORMAL HIGH
Glucose-Capillary: 247 — ABNORMAL HIGH

## 2011-03-25 LAB — POCT I-STAT, CHEM 8
Calcium, Ion: 1.13
Hemoglobin: 15
Sodium: 140
TCO2: 27

## 2011-03-25 LAB — URINE CULTURE
Colony Count: NO GROWTH
Culture: NO GROWTH

## 2011-03-25 LAB — DIFFERENTIAL
Lymphocytes Relative: 27
Monocytes Absolute: 0.4
Monocytes Relative: 9
Neutro Abs: 2.9

## 2011-03-25 LAB — CBC
HCT: 46
Platelets: 306
RDW: 12.8

## 2011-03-26 LAB — GLUCOSE, CAPILLARY
Glucose-Capillary: 140 — ABNORMAL HIGH
Glucose-Capillary: 171 — ABNORMAL HIGH
Glucose-Capillary: 184 — ABNORMAL HIGH
Glucose-Capillary: 189 — ABNORMAL HIGH

## 2011-03-26 LAB — BASIC METABOLIC PANEL
BUN: 11
BUN: 8
CO2: 24
CO2: 25
Calcium: 8.7
Calcium: 9
Chloride: 103
Chloride: 99
Creatinine, Ser: 1.12
GFR calc Af Amer: >60
GFR calc Af Amer: >60
GFR calc non Af Amer: 51 — ABNORMAL LOW
GFR calc non Af Amer: >60
Glucose, Bld: 168 — ABNORMAL HIGH
Glucose, Bld: 191 — ABNORMAL HIGH
Potassium: 4.1
Potassium: 4.9
Potassium: 5.1
Sodium: 135
Sodium: 136

## 2011-03-26 LAB — CBC
HCT: 40.2
HCT: 43.8
HCT: 44.3
Hemoglobin: 13.8
Hemoglobin: 14
Hemoglobin: 15.1
MCHC: 33.5
MCHC: 34
MCHC: 34.8
MCV: 91.8
MCV: 92.5
Platelets: 333
Platelets: 345
Platelets: 347
RBC: 4.42
RBC: 4.79
RDW: 12.2
RDW: 12.2
RDW: 12.4
WBC: 7.7
WBC: 7.9
WBC: 8

## 2011-03-26 LAB — CARDIAC PANEL(CRET KIN+CKTOT+MB+TROPI)
CK, MB: 91.6 — ABNORMAL HIGH
Relative Index: 4.4 — ABNORMAL HIGH
Relative Index: 6.2 — ABNORMAL HIGH
Total CK: 1489 — ABNORMAL HIGH
Total CK: 489 — ABNORMAL HIGH
Total CK: 909 — ABNORMAL HIGH
Troponin I: 17.31
Troponin I: 54.6

## 2011-03-26 LAB — DIFFERENTIAL
Basophils Absolute: 0
Basophils Relative: 0
Eosinophils Absolute: 0.3
Eosinophils Relative: 4
Lymphocytes Relative: 18
Lymphs Abs: 1.4
Monocytes Absolute: 0.3
Monocytes Relative: 4
Neutro Abs: 5.9
Neutrophils Relative %: 74

## 2011-03-26 LAB — CK TOTAL AND CKMB (NOT AT ARMC)
CK, MB: 2.1
Relative Index: INVALID
Total CK: 74

## 2011-03-26 LAB — MAGNESIUM: Magnesium: 2.3

## 2011-03-26 LAB — TROPONIN I: Troponin I: 0.52

## 2011-03-26 LAB — HEMOGLOBIN A1C: Mean Plasma Glucose: 157

## 2011-03-26 LAB — COMPREHENSIVE METABOLIC PANEL
BUN: 17
Calcium: 8.9
Glucose, Bld: 166 — ABNORMAL HIGH
Sodium: 138
Total Protein: 6.3

## 2011-03-26 LAB — PLATELET COUNT: Platelets: 324

## 2011-03-26 LAB — LIPID PANEL: HDL: 20 — ABNORMAL LOW

## 2011-03-26 LAB — TSH: TSH: 0.868

## 2011-03-26 LAB — HEPATIC FUNCTION PANEL
AST: 121 — ABNORMAL HIGH
Albumin: 3.1 — ABNORMAL LOW
Total Protein: 6

## 2013-03-02 ENCOUNTER — Ambulatory Visit (INDEPENDENT_AMBULATORY_CARE_PROVIDER_SITE_OTHER): Payer: Medicare Other | Admitting: Cardiovascular Disease

## 2013-03-02 ENCOUNTER — Encounter: Payer: Self-pay | Admitting: Cardiovascular Disease

## 2013-03-02 VITALS — BP 102/62 | HR 52 | Ht 71.0 in | Wt 215.0 lb

## 2013-03-02 DIAGNOSIS — I2510 Atherosclerotic heart disease of native coronary artery without angina pectoris: Secondary | ICD-10-CM

## 2013-03-02 DIAGNOSIS — E119 Type 2 diabetes mellitus without complications: Secondary | ICD-10-CM

## 2013-03-02 DIAGNOSIS — I10 Essential (primary) hypertension: Secondary | ICD-10-CM

## 2013-03-02 DIAGNOSIS — E785 Hyperlipidemia, unspecified: Secondary | ICD-10-CM

## 2013-03-02 DIAGNOSIS — G4733 Obstructive sleep apnea (adult) (pediatric): Secondary | ICD-10-CM

## 2013-03-02 NOTE — Progress Notes (Signed)
03/02/2013 [name]   08/03/1940  [id number]  Primary Physician Pcp Not In System Primary Cardiologist: [name] MD Roseanne Reno  HPI:  The patient is a delightful 72 year old mildly to moderately overweight divorced Caucasian male father of 1, grandfather of 2 grandchildren whose care I am assuming in transition from Dr. [name]. He is currently retired. 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 His cardiac risk factor profile is positive for remote tobacco abuse, having quit 30 years ago, treated hypertension, hyperlipidemia and non-insulin-requiring diabetes. He does have a family history of heart disease with a father who died of an MI at age 29 and a brother who has CAD as well. He had his first MI in 1997 and multiple stents since that time, beginning in 2006 and 2009. I intervened on him in the setting of unstable angina on July 21, 2010 and re-dilated his PLA stent which had a 75% "in-stent" restenosis and stented his PDA. His EF at that time was 45% with moderate inferobasal hypokinesia. He does get dyspnea but denies chest pain. [facility] [name].  Since I saw him back 07/29/12 he has remained stable. He does get exertional chest pain which is nitrate responsive several times a month which have not changed in frequency or severity.      Current Outpatient Prescriptions  Medication Sig Dispense Refill  . acetaminophen (TYLENOL) 650 MG CR tablet Take 650 mg by mouth 2 (two) times daily.      Marland Kitchen aspirin 325 MG tablet Take 325 mg by mouth daily.      . Cholecalciferol (VITAMIN D-3) 1000 UNITS CAPS Take 2,000 Units by mouth daily.      . clopidogrel (PLAVIX) 75 MG tablet Take 75 mg by mouth daily.      Marland Kitchen glucosamine-chondroitin 500-400 MG tablet Take 1 tablet by mouth. 3 tablets (1500/1200mg ) twice a day      . lisinopril (PRINIVIL,ZESTRIL) 40 MG tablet Take 40 mg by mouth daily.      . metFORMIN (GLUCOPHAGE) 500 MG tablet Take 500 mg  by mouth 2 (two) times daily with a meal.      . metoprolol (LOPRESSOR) 100 MG tablet Take 100 mg by mouth. Taking 1/2 tablet twice a day      . ranitidine (ZANTAC) 150 MG tablet Take 150 mg by mouth daily.      . rosuvastatin (CRESTOR) 10 MG tablet Take 10 mg by mouth daily.       No current facility-administered medications for this visit.    No Known Allergies  History   Social History  . Marital Status: Widowed    Spouse Name: N/A    Number of Children: N/A  . Years of Education: N/A   Occupational History  . Not on file.   Social History Main Topics  . Smoking status: Former Smoker    Quit date: 03/02/1973  . Smokeless tobacco: Not on file  . Alcohol Use: Yes     Comment: social   . Drug Use: Not on file  . Sexual Activity: Not on file   Other Topics Concern  . Not on file   Social History Narrative  . No narrative on file     Review of Systems: General: negative for chills, fever, night sweats or weight changes.  Cardiovascular: negative for chest pain, dyspnea on exertion, edema, orthopnea, palpitations, paroxysmal nocturnal dyspnea or shortness of breath Dermatological: negative for rash Respiratory: negative  for cough or wheezing Urologic: negative for hematuria Abdominal: negative for nausea, vomiting, diarrhea, bright red blood per rectum, melena, or hematemesis Neurologic: negative for visual changes, syncope, or dizziness All other systems reviewed and are otherwise negative except as noted above.    Blood pressure 102/62, pulse 52, height 5\' 11"  (1.803 m), weight 215 lb (97.523 kg).  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  ASSESSMENT AND PLAN:   CAD Patient has a history of coronary artery disease. He had a stent placed in his posterior lateral branch in the past. His first MI was in 1997. I restarted him in the setting of unstable angina 07/21/10 I redilated with PLA stent for "in-stent restenosis and placed an additional stent in the posterior descending artery. His ejection fraction at that time was 45% with moderate inferobasal hypokinesia. He does get exertional chest pain that much unresponsive several times a month which have not changed in frequency or severity.  Hyperlipidemia On statin therapy followed by the [facility] in [place]  Essential hypertension Well-controlled on current medications      [name] MD [facility], [facility] 03/02/2013 10:45 AM

## 2013-03-02 NOTE — Assessment & Plan Note (Signed)
Well-controlled on current medications 

## 2013-03-02 NOTE — Patient Instructions (Addendum)
Your physician wants you to follow-up in: 6 months with an extender and 1 year with Dr Berry. You will receive a reminder letter in the mail two months in advance. If you don't receive a letter, please call our office to schedule the follow-up appointment.  

## 2013-03-02 NOTE — Assessment & Plan Note (Signed)
On statin therapy followed by the [facility] in [place]

## 2013-03-02 NOTE — Assessment & Plan Note (Signed)
Patient has a history of coronary artery disease. He had a stent placed in his posterior lateral branch in the past. His first MI was in 1997. I restarted him in the setting of unstable angina 07/21/10 I redilated with PLA stent for "in-stent restenosis and placed an additional stent in the posterior descending artery. His ejection fraction at that time was 45% with moderate inferobasal hypokinesia. He does get exertional chest pain that much unresponsive several times a month which have not changed in frequency or severity.

## 2013-07-20 ENCOUNTER — Telehealth: Payer: Self-pay | Admitting: *Deleted

## 2013-07-20 ENCOUNTER — Encounter: Payer: Self-pay | Admitting: *Deleted

## 2013-07-20 NOTE — Telephone Encounter (Signed)
Patient walked in requesting clearance to hold Plavix prior to hand surgery.  [facility]  Clearance needs to be faxed to Shelah [facility]agoner [phone]  Dr Allyson SabalBerry reviewed the chart and cleared the patient for surgery and to hold the plavix.  Letter will be faxed.

## 2013-08-30 ENCOUNTER — Ambulatory Visit: Payer: Medicare Other | Admitting: Cardiovascular Disease

## 2013-09-01 ENCOUNTER — Ambulatory Visit: Payer: Medicare Other | Admitting: Cardiovascular Disease

## 2013-09-15 ENCOUNTER — Encounter: Payer: Self-pay | Admitting: Cardiovascular Disease

## 2013-09-15 ENCOUNTER — Ambulatory Visit (INDEPENDENT_AMBULATORY_CARE_PROVIDER_SITE_OTHER): Payer: Medicare Other | Admitting: Cardiovascular Disease

## 2013-09-15 VITALS — BP 120/60 | HR 60 | Ht 71.0 in | Wt 215.0 lb

## 2013-09-15 DIAGNOSIS — I2510 Atherosclerotic heart disease of native coronary artery without angina pectoris: Secondary | ICD-10-CM

## 2013-09-15 DIAGNOSIS — I10 Essential (primary) hypertension: Secondary | ICD-10-CM

## 2013-09-15 DIAGNOSIS — E785 Hyperlipidemia, unspecified: Secondary | ICD-10-CM

## 2013-09-15 DIAGNOSIS — Z01818 Encounter for other preprocedural examination: Secondary | ICD-10-CM

## 2013-09-15 NOTE — Progress Notes (Signed)
09/15/2013 Joel Stanley   [birth year]  [id number]  Primary Physician Pcp Not In System Primary Cardiologist: Runell GessJonathan J. Berry MD Roseanne RenoFACP,FACC,FAHA, FSCAI   HPI:  The patient is a delightful 73 year old mildly to moderately overweight divorced Caucasian male father of 1, grandfather of 2 grandchildren whose care I am assuming in transition from Dr. Caprice KluverAl Little. He is currently retired. 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 His cardiac risk factor profile is positive for remote tobacco abuse, having quit 30 years ago, treated hypertension, hyperlipidemia and non-insulin-requiring diabetes. He does have a family history of heart disease with a father who died of an MI at age 73 and a brother who has CAD as well. He had his first MI in 1997 and multiple stents since that time, beginning in 2006 and 2009. I intervened on him in the setting of unstable angina on July 21, 2010 and re-dilated his PLA stent which had a 75% "in-stent" restenosis and stented his PDA. His EF at that time was 45% with moderate inferobasal hypokinesia. He does get dyspnea but denies chest pain. [facility] Talmage NapBalan follow his lab work.  Since I saw him back 07/29/12 he has remained stable. He does get exertional chest pain which is nitrate responsive several times a month which have not changed in frequency or severity.since I last saw him in September his urine clinically stable. He is going to have an elective total knee replacement in mid April. It has been 4 years since his last functional study which I'm going to repeat risk stratify him.    Current Outpatient Prescriptions  Medication Sig Dispense Refill  . acetaminophen (TYLENOL) 650 MG CR tablet Take 650 mg by mouth 2 (two) times daily.      Marland Kitchen. aspirin 325 MG tablet Take 325 mg by mouth daily.      . Cholecalciferol (VITAMIN D-3) 1000 UNITS CAPS Take 2,000 Units by mouth daily.      . clopidogrel (PLAVIX) 75 MG tablet Take 75 mg by mouth daily.      Marland Kitchen.  glucosamine-chondroitin 500-400 MG tablet Take 1 tablet by mouth. 3 tablets (1500/1200mg ) twice a day      . lisinopril (PRINIVIL,ZESTRIL) 40 MG tablet Take 40 mg by mouth daily.      . metFORMIN (GLUCOPHAGE) 500 MG tablet Take 500 mg by mouth 2 (two) times daily with a meal.      . metoprolol (LOPRESSOR) 100 MG tablet Take 100 mg by mouth. Taking 1/2 tablet twice a day      . ranitidine (ZANTAC) 150 MG tablet Take 150 mg by mouth daily.      . rosuvastatin (CRESTOR) 10 MG tablet Take 10 mg by mouth daily.       No current facility-administered medications for this visit.    No Known Allergies  History   Social History  . Marital Status: Widowed    Spouse Name: N/A    Number of Children: N/A  . Years of Education: N/A   Occupational History  . Not on file.   Social History Main Topics  . Smoking status: Former Smoker    Quit date: 03/02/1973  . Smokeless tobacco: Not on file  . Alcohol Use: Yes     Comment: social   . Drug Use: Not on file  . Sexual Activity: Not on file   Other Topics Concern  . Not on file   Social History Narrative  . No narrative on file  Review of Systems: General: negative for chills, fever, night sweats or weight changes.  Cardiovascular: negative for chest pain, dyspnea on exertion, edema, orthopnea, palpitations, paroxysmal nocturnal dyspnea or shortness of breath Dermatological: negative for rash Respiratory: negative for cough or wheezing Urologic: negative for hematuria Abdominal: negative for nausea, vomiting, diarrhea, bright red blood per rectum, melena, or hematemesis Neurologic: negative for visual changes, syncope, or dizziness All other systems reviewed and are otherwise negative except as noted above.    Blood pressure 120/60, pulse 60, height 5\' 11"  (1.803 m), weight 215 lb (97.523 kg).  General appearance: alert and no distress Neck: no adenopathy, no carotid bruit, no JVD, supple, symmetrical, trachea midline and thyroid  not enlarged, symmetric, no tenderness/mass/nodules Lungs: clear to auscultation bilaterally Heart: regular rate and rhythm, S1, S2 normal, no murmur, click, rub or gallop Extremities: extremities normal, atraumatic, no cyanosis or edema  EKG sinus bradycardia at 59 with inferior Q waves unchanged from prior EKG. There is also early R-wave transition suggesting posterior extension.  ASSESSMENT AND PLAN:   Hyperlipidemia On statin therapy followed by the [facility]  Essential hypertension Controlled on current medications  CAD History of CAD with multiple interventions in the past. His last cardiac catheterization was performed by myself 08/21/10 revealing a chronically occluded mid LAD, 75% in-stent restenosis within the previously placed PLA stent which was dilated to less than 20% and a 90% lesion in the mid PDA which was stented. His EF is 45% with moderate inferobasal hypokinesia. His last Myoview stress test performed 07/16/09 it was nonischemic with inferior scar. He is scheduled to have elective total knee replacement. It has been 3 years since his last functional study was done for repeat to risk stratify him.      [name] MD FACP,FACC,FAHA, [facility] 09/15/2013 11:38 AM

## 2013-09-15 NOTE — Patient Instructions (Signed)
Your physician has requested that you have a lexiscan myoview. For further information please visit [email or web address]. Please follow instruction sheet, as given.  Dr Allyson SabalBerry recommends that you schedule a follow-up appointment with an extender in 6 months.  Dr Allyson SabalBerry recommends that you schedule a follow-up appointment with him in 12 months.

## 2013-09-15 NOTE — Assessment & Plan Note (Signed)
History of CAD with multiple interventions in the past. His last cardiac catheterization was performed by myself 08/21/10 revealing a chronically occluded mid LAD, 75% in-stent restenosis within the previously placed PLA stent which was dilated to less than 20% and a 90% lesion in the mid PDA which was stented. His EF is 45% with moderate inferobasal hypokinesia. His last Myoview stress test performed 07/16/09 it was nonischemic with inferior scar. He is scheduled to have elective total knee replacement. It has been 3 years since his last functional study was done for repeat to risk stratify him.

## 2013-09-15 NOTE — Assessment & Plan Note (Signed)
Controlled on current medications 

## 2013-09-15 NOTE — Assessment & Plan Note (Signed)
On statin therapy followed by the [facility] 

## 2013-09-16 ENCOUNTER — Telehealth (HOSPITAL_COMMUNITY): Payer: Self-pay | Admitting: *Deleted

## 2013-09-21 ENCOUNTER — Ambulatory Visit (HOSPITAL_COMMUNITY)
Admission: RE | Admit: 2013-09-21 | Discharge: 2013-09-21 | Disposition: A | Discharge status: Home / Self Care | Payer: Medicare Other | Source: Ambulatory Visit | Attending: Cardiovascular Disease | Admitting: Cardiovascular Disease

## 2013-09-21 DIAGNOSIS — I2510 Atherosclerotic heart disease of native coronary artery without angina pectoris: Secondary | ICD-10-CM | POA: Insufficient documentation

## 2013-09-21 DIAGNOSIS — Z01810 Encounter for preprocedural cardiovascular examination: Secondary | ICD-10-CM

## 2013-09-21 DIAGNOSIS — Z01818 Encounter for other preprocedural examination: Secondary | ICD-10-CM | POA: Insufficient documentation

## 2013-09-21 MED ORDER — TECHNETIUM TC 99M SESTAMIBI GENERIC - CARDIOLITE
9.7000 | Freq: Once | INTRAVENOUS | Status: AC | PRN
  Administered 2013-09-21: 10 via INTRAVENOUS

## 2013-09-21 MED ORDER — REGADENOSON 0.4 MG/5ML IV SOLN
0.4000 mg | Freq: Once | INTRAVENOUS | Status: AC
  Administered 2013-09-21: 0.4 mg via INTRAVENOUS

## 2013-09-21 MED ORDER — TECHNETIUM TC 99M SESTAMIBI GENERIC - CARDIOLITE
30.6000 | Freq: Once | INTRAVENOUS | Status: AC | PRN
  Administered 2013-09-21: 31 via INTRAVENOUS

## 2013-09-21 NOTE — Procedures (Addendum)
[facility] [address] [place] [address] [phone]  Cardiology Nuclear Med Study  Doneen PoissonJerry W Stanley is a 73 y.o. male     MRN : [id number]     DOB: Aug 28, 1940  Procedure Date: 09/21/2013  Nuclear Med Background Indication for Stress Test:  Surgical Clearance History:  CAD;MI-1997;STENT/PTCA-2006, 2009 AND 2012;last NUC MPI on 07/16/2009-nonischemic;EF=56% Cardiac Risk Factors: Family History - CAD, History of Smoking, Hypertension, LBBB, NIDDM and Overweight  Symptoms:  Chest Pain and DOE   Nuclear Pre-Procedure Caffeine/Decaff Intake:  1:00am NPO After: 11am   IV Site: R Forearm  IV 0.9% NS with Angio Cath:  22g  Chest Size (in):  44"  IV Started by: Emmit PomfretAmanda Hicks, RN  Height: 5\' 11"  (1.803 m)  Cup Size: n/a  BMI:  Body mass index is 30 kg/(m^2). Weight:  215 lb (97.523 kg)   Tech Comments:  n/a    Nuclear Med Study 1 or 2 day study: 1 day  Stress Test Type:  Lexiscan  Order Authorizing Provider:  Nanetta BattyJonathan Berry, MD   Resting Radionuclide: Technetium 6857m Sestamibi  Resting Radionuclide Dose: 9.7 mCi   Stress Radionuclide:  Technetium 2357m Sestamibi  Stress Radionuclide Dose: 30.6 mCi           Stress Protocol Rest HR: 52 Stress HR:80  Rest BP: 131/83 Stress BP: 142/96  Exercise Time (min): n/a METS: n/a          Dose of Adenosine (mg):  n/a Dose of Lexiscan: 0.4 mg  Dose of Atropine (mg): n/a Dose of Dobutamine: n/a mcg/kg/min (at max HR)  Stress Test Technologist: Ernestene MentionGwen Farrington, CCT Nuclear Technologist: Gonzella LexPam Phillips, CNMT   Rest Procedure:  Myocardial perfusion imaging was performed at rest 45 minutes following the intravenous administration of Technetium 5457m Sestamibi. Stress Procedure:  The patient received IV Lexiscan 0.4 mg over 15-seconds.  Technetium 457m Sestamibi injected IV at 30-seconds.  There were no significant changes with Lexiscan.  Quantitative spect images were obtained after a 45  minute delay.  Transient Ischemic Dilatation (Normal <1.22):  0.88 Lung/Heart Ratio (Normal <0.45):  0.30 QGS EDV:  99 ml QGS ESV:  42 ml LV Ejection Fraction: 57%      Rest ECG: NSR, LAD, LAFB  Stress ECG: No significant change from baseline ECG  QPS Raw Data Images:  Normal; no motion artifact; normal heart/lung ratio. Stress Images:  There is decreased uptake in the inferior wall. Rest Images:  There is minimal impovement in the inferoapical segment; most of the inferior defect remains unchanged. Subtraction (SDS):  There is a fixed defect that is most consistent with a previous infarction. LV Wall Motion:  Normal overall LV function. Mild basal and mid inferior hypokinesis.  Impression Exercise Capacity:  Lexiscan with no exercise. BP Response:  Normal blood pressure response. Clinical Symptoms:  No significant symptoms noted. ECG Impression:  No significant ECG changes with Lexiscan. Comparison with Prior Nuclear Study: No significant change from previous study   Overall Impression:  Low risk stress nuclear study with an old inferior wall scar and minimal inferoapical ischemia. Perfusion pattern and LVEF are similar to the old report.Joel Stanley.   Joel Miralles, MD  09/21/2013 5:43 PM

## 2013-09-26 ENCOUNTER — Telehealth: Payer: Self-pay | Admitting: *Deleted

## 2013-09-26 NOTE — Telephone Encounter (Signed)
Message copied by Marella BileVOGEL, Husna Krone W. on Mon Sep 26, 2013  4:58 PM ------      Message from: Runell GessBERRY, JONATHAN J      Created: Thu Sep 22, 2013  4:39 PM       Low risk with findings similar to previous study ------

## 2013-09-29 NOTE — Telephone Encounter (Signed)
Clearance letter drafted and faxed.  Pt notified

## 2014-02-02 ENCOUNTER — Encounter: Payer: Self-pay | Admitting: Gastroenterology

## 2015-03-06 ENCOUNTER — Encounter: Payer: Self-pay | Admitting: Gastroenterology

## 2017-05-06 ENCOUNTER — Ambulatory Visit: Payer: Medicare Other | Admitting: Cardiovascular Disease

## 2017-05-06 ENCOUNTER — Encounter: Payer: Self-pay | Admitting: Cardiovascular Disease

## 2017-05-06 DIAGNOSIS — E7800 Pure hypercholesterolemia, unspecified: Secondary | ICD-10-CM

## 2017-05-06 DIAGNOSIS — I10 Essential (primary) hypertension: Secondary | ICD-10-CM | POA: Diagnosis not present

## 2017-05-06 NOTE — Assessment & Plan Note (Signed)
History of hyperlipidemia on statin therapy followed by his PCP. His most recent [facility] 03[facility]14[facility]17 revealed a total cholesterol of 99, LDL 26 and HDL of 35.

## 2017-05-06 NOTE — Assessment & Plan Note (Signed)
History of essential hypertension blood pressures measured today 160/90 initially falling to 150/82 by the end of the visit. He is on lisinopril and metoprolol. Continue current meds at current dosing

## 2017-05-06 NOTE — Addendum Note (Signed)
Addended by: Chana BodeGREEN, Burnell Matlin L on: 05/06/2017 03:52 PM   Modules accepted: Orders

## 2017-05-06 NOTE — Progress Notes (Signed)
05/06/2017 Joel Stanley Bies   1941-03-05  [id number]  Primary Physician System, Pcp Not In Primary Cardiologist: Runell GessJonathan J Dorann Davidson MD Milagros LollFACP, FACC, [place], [place]  HPI:  Joel Stanley Joel Stanley is a 76 y.o. male  mildly to moderately overweight divorced Caucasian male father of 1, grandfather of 2 grandchildren whose care I assumed from Dr. Caprice KluverAl Little. I last saw him in the office 09/15/13. He is currently retired. 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 His cardiac risk factor profile is positive for remote tobacco abuse, having quit 30 years ago, treated hypertension, hyperlipidemia and non-insulin-requiring diabetes. He does have a family history of heart disease with a father who died of an MI at age 76 and a brother who has CAD as well. He had his first MI in 1997 and multiple stents since that time, beginning in 2006 and 2009. I intervened on him in the setting of unstable angina on July 21, 2010 and re-dilated his PLA stent which had a 75% "in-stent" restenosis and stented his PDA. His EF at that time was 45% with moderate inferobasal hypokinesia. He does get dyspnea but denies chest pain. [facility] Talmage NapBalan [facility] his [facility] work.  Since I saw him back 09/15/13 he has remained stable. He did have a Myoview stress test performed 09/21/13 which was low risk and nonischemic. [facility] most [facility] 03[facility]14/17 revealed total cholesterol 99, LDL 26 and HDL 35.   Current Meds  Medication Sig  . acetaminophen (TYLENOL) 650 MG CR tablet Take 650 mg by mouth 2 (two) times daily.  Marland Kitchen. aspirin 325 MG tablet Take 325 mg by mouth daily.  . Cholecalciferol (VITAMIN D-3) 1000 UNITS CAPS Take 2,000 Units by mouth daily.  . clopidogrel (PLAVIX) 75 MG tablet Take 75 mg by mouth daily.  Marland Kitchen. glucosamine-chondroitin 500-400 MG tablet Take 1 tablet by mouth. 3 tablets (1500/1200mg ) twice a day  . lisinopril (PRINIVIL,ZESTRIL) 40 MG tablet Take 40 mg by mouth daily.  . metFORMIN  (GLUCOPHAGE) 500 MG tablet Take 500 mg by mouth 2 (two) times daily with a meal.  . metoprolol (LOPRESSOR) 100 MG tablet Take 100 mg by mouth. Taking 1/2 tablet twice a day  . ranitidine (ZANTAC) 150 MG tablet Take 150 mg by mouth daily.  . rosuvastatin (CRESTOR) 10 MG tablet Take 10 mg by mouth daily.     No Known Allergies  Social History   Socioeconomic History  . Marital status: Widowed    Spouse name: Not on file  . Number of children: Not on file  . Years of education: Not on file  . Highest education level: Not on file  Social Needs  . Financial resource strain: Not on file  . Food insecurity - worry: Not on file  . Food insecurity - inability: Not on file  . Transportation needs - medical: Not on file  . Transportation needs - non-medical: Not on file  Occupational History  . Not on file  Tobacco Use  . Smoking status: Former Smoker    Last attempt to quit: 03/02/1973    Years since quitting: 44.2  . Smokeless tobacco: Never Used  Substance and Sexual Activity  . Alcohol use: Yes    Comment: social   . Drug use: Not on file  . Sexual activity: Not on file  Other Topics Concern  . Not on file  Social History Narrative  . Not on file  Review of Systems: General: negative for chills, fever, night sweats or weight changes.  Cardiovascular: negative for chest pain, dyspnea on exertion, edema, orthopnea, palpitations, paroxysmal nocturnal dyspnea or shortness of breath Dermatological: negative for rash Respiratory: negative for cough or wheezing Urologic: negative for hematuria Abdominal: negative for nausea, vomiting, diarrhea, bright red blood per rectum, melena, or hematemesis Neurologic: negative for visual changes, syncope, or dizziness All other systems reviewed and are otherwise negative except as noted above.    Blood pressure (!) 150/82, pulse (!) 53, height 5\' 11"  (1.803 m), weight 204 lb (92.5 kg).  General appearance: alert and no distress Neck:  no adenopathy, no carotid bruit, no JVD, supple, symmetrical, trachea midline and thyroid not enlarged, symmetric, no tenderness/mass/nodules Lungs: clear to auscultation bilaterally Heart: regular rate and rhythm, S1, S2 normal, no murmur, click, rub or gallop Extremities: extremities normal, atraumatic, no cyanosis or edema Pulses: 2+ and symmetric Skin: Skin color, texture, turgor normal. No rashes or lesions Neurologic: Alert and oriented X 3, normal strength and tone. Normal symmetric reflexes. Normal coordination and gait  EKG sinus bradycardia 53 with early R-wave transition and inferior Q waves. I personally reviewed this EKG.  ASSESSMENT AND PLAN:   CAD History of CAD status post myocardial infarction 1997 with multiple stents at that time beginning in 2006 at 2009. I intervened on him in the setting of unstable angina 07/21/10 with redilatation of his PLA stent which had 75% "in-stent restenosis and I stented his PDA as well. At that time his EF was 45%. He denies chest pain or shortness of breath. His last functional study performed office 09/21/13 was nonischemic.    Essential hypertension History of essential hypertension blood pressures measured today 160/90 initially falling to 150/82 by the end of the visit. He is on lisinopril and metoprolol. Continue current meds at current dosing  Hyperlipidemia History of hyperlipidemia on statin therapy followed by his PCP. [facility] revealed a total cholesterol of 99, LDL 26 and HDL of 35.      Runell GessJonathan J. Jacquelynne Guedes MD FACP,FACC,FAHA, [facility] Hospital[facility]SCAI 05/06/2017 3:34 PM

## 2017-05-06 NOTE — Assessment & Plan Note (Signed)
History of CAD status post myocardial infarction 1997 with multiple stents at that time beginning in 2006 at 2009. I intervened on him in the setting of unstable angina 07/21/10 with redilatation of his PLA stent which had 75% "in-stent restenosis and I stented his PDA as well. At that time his EF was 45%. He denies chest pain or shortness of breath. His last functional study performed office 09/21/13 was nonischemic.

## 2017-05-06 NOTE — Patient Instructions (Signed)

## 2017-12-13 ENCOUNTER — Observation Stay (HOSPITAL_COMMUNITY)
Admission: EM | Admit: 2017-12-13 | Discharge: 2017-12-14 | Disposition: A | Discharge status: Home / Self Care | Payer: Medicare Other | Attending: Internal Medicine | Admitting: Internal Medicine

## 2017-12-13 ENCOUNTER — Other Ambulatory Visit: Payer: Self-pay

## 2017-12-13 ENCOUNTER — Encounter (HOSPITAL_COMMUNITY): Payer: Self-pay | Admitting: Emergency Medicine

## 2017-12-13 ENCOUNTER — Emergency Department (HOSPITAL_COMMUNITY): Payer: Medicare Other

## 2017-12-13 DIAGNOSIS — Z8249 Family history of ischemic heart disease and other diseases of the circulatory system: Secondary | ICD-10-CM | POA: Diagnosis not present

## 2017-12-13 DIAGNOSIS — Z955 Presence of coronary angioplasty implant and graft: Secondary | ICD-10-CM | POA: Diagnosis not present

## 2017-12-13 DIAGNOSIS — N183 Chronic kidney disease, stage 3 unspecified: Secondary | ICD-10-CM | POA: Diagnosis present

## 2017-12-13 DIAGNOSIS — I25118 Atherosclerotic heart disease of native coronary artery with other forms of angina pectoris: Secondary | ICD-10-CM

## 2017-12-13 DIAGNOSIS — E113213 Type 2 diabetes mellitus with mild nonproliferative diabetic retinopathy with macular edema, bilateral: Secondary | ICD-10-CM

## 2017-12-13 DIAGNOSIS — I152 Hypertension secondary to endocrine disorders: Secondary | ICD-10-CM | POA: Diagnosis present

## 2017-12-13 DIAGNOSIS — Z7982 Long term (current) use of aspirin: Secondary | ICD-10-CM | POA: Diagnosis not present

## 2017-12-13 DIAGNOSIS — Z79899 Other long term (current) drug therapy: Secondary | ICD-10-CM | POA: Insufficient documentation

## 2017-12-13 DIAGNOSIS — I10 Essential (primary) hypertension: Secondary | ICD-10-CM | POA: Diagnosis not present

## 2017-12-13 DIAGNOSIS — Z7901 Long term (current) use of anticoagulants: Secondary | ICD-10-CM | POA: Diagnosis not present

## 2017-12-13 DIAGNOSIS — I25110 Atherosclerotic heart disease of native coronary artery with unstable angina pectoris: Principal | ICD-10-CM | POA: Insufficient documentation

## 2017-12-13 DIAGNOSIS — R079 Chest pain, unspecified: Secondary | ICD-10-CM | POA: Diagnosis present

## 2017-12-13 DIAGNOSIS — I129 Hypertensive chronic kidney disease with stage 1 through stage 4 chronic kidney disease, or unspecified chronic kidney disease: Secondary | ICD-10-CM | POA: Insufficient documentation

## 2017-12-13 DIAGNOSIS — Z823 Family history of stroke: Secondary | ICD-10-CM | POA: Insufficient documentation

## 2017-12-13 DIAGNOSIS — I200 Unstable angina: Secondary | ICD-10-CM | POA: Diagnosis present

## 2017-12-13 DIAGNOSIS — I517 Cardiomegaly: Secondary | ICD-10-CM | POA: Insufficient documentation

## 2017-12-13 DIAGNOSIS — G4733 Obstructive sleep apnea (adult) (pediatric): Secondary | ICD-10-CM | POA: Diagnosis not present

## 2017-12-13 DIAGNOSIS — Z87891 Personal history of nicotine dependence: Secondary | ICD-10-CM | POA: Insufficient documentation

## 2017-12-13 DIAGNOSIS — Z9889 Other specified postprocedural states: Secondary | ICD-10-CM | POA: Diagnosis not present

## 2017-12-13 DIAGNOSIS — E785 Hyperlipidemia, unspecified: Secondary | ICD-10-CM | POA: Diagnosis not present

## 2017-12-13 DIAGNOSIS — E1159 Type 2 diabetes mellitus with other circulatory complications: Secondary | ICD-10-CM

## 2017-12-13 DIAGNOSIS — I2510 Atherosclerotic heart disease of native coronary artery without angina pectoris: Secondary | ICD-10-CM | POA: Diagnosis present

## 2017-12-13 DIAGNOSIS — Z9049 Acquired absence of other specified parts of digestive tract: Secondary | ICD-10-CM | POA: Insufficient documentation

## 2017-12-13 DIAGNOSIS — Z96652 Presence of left artificial knee joint: Secondary | ICD-10-CM | POA: Diagnosis not present

## 2017-12-13 DIAGNOSIS — Z833 Family history of diabetes mellitus: Secondary | ICD-10-CM | POA: Diagnosis not present

## 2017-12-13 DIAGNOSIS — E1122 Type 2 diabetes mellitus with diabetic chronic kidney disease: Secondary | ICD-10-CM | POA: Diagnosis not present

## 2017-12-13 DIAGNOSIS — E119 Type 2 diabetes mellitus without complications: Secondary | ICD-10-CM

## 2017-12-13 LAB — BASIC METABOLIC PANEL
Anion gap: 11 (ref 5–15)
BUN: 16 mg/dL (ref 6–20)
CHLORIDE: 103 mmol/L (ref 101–111)
CO2: 24 mmol/L (ref 22–32)
CREATININE: 1.29 mg/dL — AB (ref 0.61–1.24)
Calcium: 9.5 mg/dL (ref 8.9–10.3)
GFR calc non Af Amer: 52 mL/min — ABNORMAL LOW (ref >60)
Glucose, Bld: 166 mg/dL — ABNORMAL HIGH (ref 65–99)
Potassium: 5 mmol/L (ref 3.5–5.1)
SODIUM: 138 mmol/L (ref 135–145)

## 2017-12-13 LAB — CBC
HCT: 41.9 % (ref 39.0–52.0)
Hemoglobin: 13.9 g/dL (ref 13.0–17.0)
MCH: 30.3 pg (ref 26.0–34.0)
MCHC: 33.2 g/dL (ref 30.0–36.0)
MCV: 91.5 fL (ref 78.0–100.0)
PLATELETS: 262 10*3/uL (ref 150–400)
RBC: 4.58 MIL/uL (ref 4.22–5.81)
RDW: 12.5 % (ref 11.5–15.5)
WBC: 5.3 10*3/uL (ref 4.0–10.5)

## 2017-12-13 LAB — I-STAT TROPONIN, ED: Troponin i, poc: 0 ng/mL (ref 0.00–0.08)

## 2017-12-13 LAB — HEPATIC FUNCTION PANEL
ALBUMIN: 4.1 g/dL (ref 3.5–5.0)
ALK PHOS: 45 U/L (ref 38–126)
ALT: 21 U/L (ref 17–63)
AST: 17 U/L (ref 15–41)
Bilirubin, Direct: 0.2 mg/dL (ref 0.1–0.5)
Indirect Bilirubin: 0.7 mg/dL (ref 0.3–0.9)
TOTAL PROTEIN: 6.5 g/dL (ref 6.5–8.1)
Total Bilirubin: 0.9 mg/dL (ref 0.3–1.2)

## 2017-12-13 MED ORDER — HEPARIN SODIUM (PORCINE) 5000 UNIT/ML IJ SOLN
5000.0000 [IU] | Freq: Three times a day (TID) | INTRAMUSCULAR | Status: DC
Start: 2017-12-13 — End: 2017-12-14
  Administered 2017-12-14 (×2): 5000 [IU] via SUBCUTANEOUS
  Filled 2017-12-13 (×2): qty 1

## 2017-12-13 MED ORDER — ASPIRIN 325 MG PO TABS: 325.0000 mg | ORAL_TABLET | Freq: Every day | ORAL | Status: DC

## 2017-12-13 MED ORDER — LISINOPRIL 40 MG PO TABS
40.0000 mg | ORAL_TABLET | Freq: Every day | ORAL | Status: DC
  Administered 2017-12-14: 40 mg via ORAL
  Filled 2017-12-13: qty 1

## 2017-12-13 MED ORDER — ZOLPIDEM TARTRATE 5 MG PO TABS: 5.0000 mg | ORAL_TABLET | Freq: Every evening | ORAL | Status: DC | PRN

## 2017-12-13 MED ORDER — INSULIN ASPART 100 UNIT/ML ~~LOC~~ SOLN
0.0000 [IU] | SUBCUTANEOUS | Status: DC
  Administered 2017-12-14: 2 [IU] via SUBCUTANEOUS

## 2017-12-13 MED ORDER — ONDANSETRON HCL 4 MG/2ML IJ SOLN: 4.0000 mg | Freq: Four times a day (QID) | INTRAMUSCULAR | Status: DC | PRN

## 2017-12-13 MED ORDER — ROSUVASTATIN CALCIUM 10 MG PO TABS: 10.0000 mg | ORAL_TABLET | Freq: Every day | ORAL | Status: DC

## 2017-12-13 MED ORDER — MORPHINE SULFATE (PF) 4 MG/ML IV SOLN
1.0000 mg | INTRAVENOUS | Status: DC | PRN
Start: 2017-12-13 — End: 2017-12-14

## 2017-12-13 MED ORDER — CLOPIDOGREL BISULFATE 75 MG PO TABS
75.0000 mg | ORAL_TABLET | Freq: Every day | ORAL | Status: DC
  Administered 2017-12-14: 75 mg via ORAL
  Filled 2017-12-13: qty 1

## 2017-12-13 MED ORDER — FAMOTIDINE IN NACL 20-0.9 MG/50ML-% IV SOLN
20.0000 mg | Freq: Once | INTRAVENOUS | Status: AC
  Administered 2017-12-13: 20 mg via INTRAVENOUS
  Filled 2017-12-13: qty 50

## 2017-12-13 MED ORDER — ACETAMINOPHEN 325 MG PO TABS: 650.0000 mg | ORAL_TABLET | ORAL | Status: DC | PRN

## 2017-12-13 MED ORDER — VITAMIN D 1000 UNITS PO TABS
2000.0000 [IU] | ORAL_TABLET | Freq: Every day | ORAL | Status: DC
  Administered 2017-12-14: 2000 [IU] via ORAL
  Filled 2017-12-13: qty 2

## 2017-12-13 MED ORDER — GI COCKTAIL ~~LOC~~: 30.0000 mL | Freq: Three times a day (TID) | ORAL | Status: DC | PRN

## 2017-12-13 MED ORDER — SODIUM CHLORIDE 0.9 % IV SOLN
INTRAVENOUS | Status: AC
  Administered 2017-12-14: via INTRAVENOUS

## 2017-12-13 MED ORDER — METOPROLOL TARTRATE 50 MG PO TABS
50.0000 mg | ORAL_TABLET | Freq: Two times a day (BID) | ORAL | Status: DC
Start: 2017-12-14 — End: 2017-12-14
  Administered 2017-12-14: 50 mg via ORAL
  Filled 2017-12-13: qty 1

## 2017-12-13 MED ORDER — NITROGLYCERIN 0.4 MG SL SUBL: 0.4000 mg | SUBLINGUAL_TABLET | SUBLINGUAL | Status: DC | PRN

## 2017-12-13 MED ORDER — FAMOTIDINE 20 MG PO TABS
20.0000 mg | ORAL_TABLET | Freq: Every day | ORAL | Status: DC
Start: 2017-12-14 — End: 2017-12-14
  Administered 2017-12-14: 20 mg via ORAL
  Filled 2017-12-13: qty 1

## 2017-12-13 NOTE — ED Provider Notes (Signed)
[facility] EMERGENCY DEPARTMENT Provider Note   CSN: [id number] Arrival date & time: 12/13/17  2048     History   Chief Complaint Chief Complaint  Patient presents with  . Chest Pain    HPI Joel Stanley is a 77 y.o. male hx of CAD, HL, HTN, DM, here with chest pain, nausea. Patient ate dinner around 7:30 pm. Around 30 minutes later, he had acute onset of chest pressure. He then took 2 nitros prior to arrival and pressure improved. Denies radiation of the pain. Denies any abdominal pain. Patient had multiple stents and took his ASA, plavix today. Patient had abnormal nuclear stress test recently at the [place] and is scheduled for a cath.    The history is provided by the patient.    Past Medical History:  Diagnosis Date  . Coronary artery disease   . Exertional angina (HCC)   . Family history of heart disease   . Hyperlipidemia   . Hypertension   . Obstructive sleep apnea   . Type 2 diabetes mellitus [facility])     Patient Active Problem List   Diagnosis Date Noted  . CKD (chronic kidney disease), stage III ([place]) 12/13/2017  . Chest pain 12/13/2017  . Essential hypertension 03/02/2013  . Hyperlipidemia 03/02/2013  . Type 2 diabetes mellitus ([place]) 03/02/2013  . Obstructive sleep apnea 03/02/2013  . CAD (coronary artery disease) 04/12/2008  . INCISIONAL HERNIA 04/12/2008  . PERSONAL HISTORY OF COLONIC POLYPS 04/12/2008    Past Surgical History:  Procedure Laterality Date  . APPENDECTOMY  1969  . CARDIAC CATHETERIZATION  08/21/2010   PLA 75% fairly focal "in-stent" restenosis-cutting balloon atherectomy performed, expanded to 10 atm, resutling in reductin to 20% without dissection. PDA 90% proximal stenosis-stented with a 2.5x76m Promus Element stent, deployed at 14 atm (2.359m resulting in reduction of 90% to 0% residual).  . CARDIAC CATHETERIZATION  05/03/2008   PLA 100% occluded proximal stenosis-stented with a [id number] stent deployed at 14  atm, resulting in reduction of 100% to 0% residual  . CARDIAC CATHETERIZATION  01/27/2005   RCA 80-85% stenosis, stenting performed with a [id number] DES, dilated to 3.75 with a 0% residual.  . CARDIOVASCULAR STRESS TEST  07/16/2009   Mild-moderate perfusion defect seen in Basal inferior and Mid inferior regions. No scitigraphic evidence of inducible myocardial ischemia. No ECG changes. EKG negative for ischemia. Post-stress EF 56%.  . CHOLECYSTECTOMY  1985  . HERNIA REPAIR  1993, 2003  . TONSILLECTOMY  1946  . TOTAL KNEE ARTHROPLASTY Left 2004        Home Medications    Prior to Admission medications   Medication Sig Start Date End Date Taking? Authorizing Provider  acetaminophen (TYLENOL) 650 MG CR tablet Take 650 mg by mouth 2 (two) times daily.   Yes [provider]  aspirin 325 MG tablet Take 325 mg by mouth daily.   Yes [provider]  Cholecalciferol (VITAMIN D-3) 1000 UNITS CAPS Take 2,000 Units by mouth daily.   Yes [provider]  clopidogrel (PLAVIX) 75 MG tablet Take 75 mg by mouth daily.   Yes [provider]  lisinopril (PRINIVIL,ZESTRIL) 40 MG tablet Take 40 mg by mouth 2 (two) times daily.    Yes [provider]  metFORMIN (GLUCOPHAGE) 500 MG tablet Take 500 mg by mouth 2 (two) times daily with a meal.   Yes [provider]  metoprolol (LOPRESSOR) 100 MG tablet Take 50 mg by mouth 2 (two)  times daily. Taking 1/2 tablet twice a day    Yes [provider]  ranitidine (ZANTAC) 150 MG tablet Take 150 mg by mouth daily.   Yes [provider]  rosuvastatin (CRESTOR) 10 MG tablet Take 10 mg by mouth daily.   Yes [provider]    Family History Family History  Problem Relation Age of Onset  . Heart attack Father 24  . Diabetes Maternal Grandmother   . Stroke Maternal Grandmother 57  . Heart attack Brother 38  . Heart attack Maternal Grandfather 64  . Heart attack Paternal Grandmother     . Heart attack Paternal Grandfather   . Pulmonary embolism Paternal Grandfather   . Heart attack Brother 23    Social History Social History   Tobacco Use  . Smoking status: Former Smoker    Last attempt to quit: 03/02/1973    Years since quitting: 44.8  . Smokeless tobacco: Never Used  Substance Use Topics  . Alcohol use: Yes    Comment: social   . Drug use: Never     Allergies   Patient has no known allergies.   Review of Systems Review of Systems  Cardiovascular: Positive for chest pain.  All other systems reviewed and are negative.    Physical Exam Updated Vital Signs BP 128/83   Pulse (!) 58   Temp 98.1 F (36.7 C) (Oral)   Resp 18   SpO2 99%   Physical Exam  Constitutional: He appears well-developed.  HENT:  Head: Normocephalic.  Eyes: Pupils are equal, round, and reactive to light.  Neck: Normal range of motion. Neck supple.  Cardiovascular: Normal rate, regular rhythm and normal pulses.  Pulmonary/Chest: Effort normal and breath sounds normal.  Abdominal: Soft. Bowel sounds are normal.  Musculoskeletal: Normal range of motion.       Right lower leg: Normal.       Left lower leg: Normal.  Neurological: He is alert.  Skin: Skin is warm. Capillary refill takes less than 2 seconds.  Psychiatric: He has a normal mood and affect. His behavior is normal.  Nursing note and vitals reviewed.    ED Treatments / Results  Labs (all labs ordered are listed, but only abnormal results are displayed) Labs Reviewed  BASIC METABOLIC PANEL - Abnormal; Notable for the following components:      Result Value   Glucose, Bld 166 (*)    Creatinine, Ser 1.29 (*)    GFR calc non Af Amer 52 (*)    All other components within normal limits  CBC  HEPATIC FUNCTION PANEL  TROPONIN I  TROPONIN I  TROPONIN I  BASIC METABOLIC PANEL  CBC  I-STAT TROPONIN, ED    EKG EKG Interpretation  Date/Time:  Sunday December 13 2017 20:52:17 EDT Ventricular Rate:  61 PR  Interval:  190 QRS Duration: 96 QT Interval:  420 QTC Calculation: 422 R Axis:   -61 Text Interpretation:  Normal sinus rhythm Left axis deviation Inferior-posterior infarct , age undetermined Abnormal ECG No significant change since last tracing Confirmed by Wandra Arthurs ([id number]) on 12/13/2017 9:14:38 PM   Radiology Dg Chest 2 View  Result Date: 12/13/2017 CLINICAL DATA:  77 y/o  M; mid to left chest pain this afternoon. EXAM: CHEST - 2 VIEW COMPARISON:  08/21/2010 chest radiograph FINDINGS: Stable mild cardiomegaly given projection and technique. Surgical clips in the right upper quadrant. No consolidation, effusion, or pneumothorax. Multilevel degenerative changes of the thoracic spine. IMPRESSION: Stable mild cardiomegaly.  No acute pulmonary process identified. Electronically Signed   By: Kristine Garbe M.D.   On: 12/13/2017 21:42    Procedures Procedures (including critical care time)  Medications Ordered in ED Medications  aspirin tablet 325 mg (has no administration in time range)  cholecalciferol (VITAMIN D) tablet 2,000 Units (has no administration in time range)  clopidogrel (PLAVIX) tablet 75 mg (has no administration in time range)  lisinopril (PRINIVIL,ZESTRIL) tablet 40 mg (has no administration in time range)  metoprolol tartrate (LOPRESSOR) tablet 50 mg (has no administration in time range)  famotidine (PEPCID) tablet 20 mg (has no administration in time range)  rosuvastatin (CRESTOR) tablet 10 mg (has no administration in time range)  nitroGLYCERIN (NITROSTAT) SL tablet 0.4 mg (has no administration in time range)  acetaminophen (TYLENOL) tablet 650 mg (has no administration in time range)  ondansetron (ZOFRAN) injection 4 mg (has no administration in time range)  insulin aspart (novoLOG) injection 0-9 Units (has no administration in time range)  heparin injection 5,000 Units (has no administration in time range)  zolpidem (AMBIEN) tablet 5 mg (has no  administration in time range)  morphine 4 MG/ML injection 1-3 mg (has no administration in time range)  gi cocktail (Maalox,Lidocaine,Donnatal) (has no administration in time range)  0.9 %  sodium chloride infusion (has no administration in time range)  famotidine (PEPCID) IVPB 20 mg premix (0 mg Intravenous Stopped 12/13/17 2209)     Initial Impression / Assessment and Plan / ED Course  I have reviewed the triage vital signs and the nursing notes.  Pertinent labs & imaging results that were available during my care of the patient were reviewed by me and considered in my medical decision making (see chart for details).    Joel Stanley is a 77 y.o. male here with chest pain, nausea. Patient has multiple stents in his heart. Concerned for possible unstable angina. Pain free now. Took ASA prior to arrival. Will consult cardiology.   10:20 pm Trop neg. Labs unremarkable. Cardiology saw patient (Dr. Paticia Stack) and recommend medicine admission. They will talk to Dr. Gwenlyn Found in AM to decide whether to cath again or not. Will need trend troponins in the hospital.   Final Clinical Impressions(s) / ED Diagnoses   Final diagnoses:  None    ED Discharge Orders    None       Drenda Freeze, MD 12/13/17 2336

## 2017-12-13 NOTE — Consult Note (Signed)
[facility] Consult Note   Primary Physician: Jagual PCP Primary Cardiologist:  Quay Burow  Reason for Consultation:  Unstable angina  HPI:    Joel Stanley is a 77 year old veteran who is seen today for evaluation of chest pain. 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 He got a PDA stent in 2012 with balloon angioplasty of the PLA for ISR. He also has a past medical history significant for remote tobacco use (quit >30 years ago), hypertension, hyperlipidemia and diabetes. He admits to some dietary indiscretion today (eating at a cookout) and he developed substernal chest pain. He took 2 sublingual NTGs and the pain subsided. It has not recurred since the afternoon.  He states that he recently had a stress test at the [place]. He had been feeling tired after walking a quarter of a mile which is unusual for him. He does not recall the results but he believes he was going to be referred for a repeat cardiac cath possibly in July.  He denies any orthopnea or PND. No palpitations or syncope.  ECG today - 61 bpm, sinus rhythm, old inferior infarct  Troponin 0.00  Review of Systems  Constitutional: Negative for weight loss.  HENT: Negative for hearing loss.   Eyes: Negative for blurred vision.  Respiratory: Negative for cough and shortness of breath.   Cardiovascular: Positive for chest pain.  Gastrointestinal: Positive for heartburn. Negative for blood in stool, constipation and diarrhea.  Genitourinary: Negative for frequency.  Musculoskeletal: Negative for myalgias.  Skin: Negative for rash.  Neurological: Negative for dizziness.  Endo/Heme/Allergies: Does not bruise/bleed easily.  Psychiatric/Behavioral: Negative for depression.       Home Medications Prior to Admission medications   Medication Sig Start Date End Date Taking? Authorizing Provider  acetaminophen (TYLENOL) 650 MG CR tablet Take 650 mg by mouth 2 (two) times daily.    Yes [provider]  aspirin 325 MG tablet Take 325 mg by mouth daily.   Yes [provider]  Cholecalciferol (VITAMIN D-3) 1000 UNITS CAPS Take 2,000 Units by mouth daily.   Yes [provider]  clopidogrel (PLAVIX) 75 MG tablet Take 75 mg by mouth daily.   Yes [provider]  lisinopril (PRINIVIL,ZESTRIL) 40 MG tablet Take 40 mg by mouth 2 (two) times daily.    Yes [provider]  metFORMIN (GLUCOPHAGE) 500 MG tablet Take 500 mg by mouth 2 (two) times daily with a meal.   Yes [provider]  metoprolol (LOPRESSOR) 100 MG tablet Take 50 mg by mouth 2 (two) times daily. Taking 1/2 tablet twice a day    Yes [provider]  ranitidine (ZANTAC) 150 MG tablet Take 150 mg by mouth daily.   Yes [provider]  rosuvastatin (CRESTOR) 10 MG tablet Take 10 mg by mouth daily.   Yes [provider]    Past Medical History: Past Medical History:  Diagnosis Date  . Coronary artery disease   . Exertional angina (HCC)   . Family history of heart disease   . Hyperlipidemia   . Hypertension   . Obstructive sleep apnea   . Type 2 diabetes mellitus ([place])     Past Surgical History: Past Surgical History:  Procedure Laterality Date  . APPENDECTOMY  1969  . CARDIAC CATHETERIZATION  08/21/2010   PLA 75% fairly focal "in-stent" restenosis-cutting balloon atherectomy performed, expanded to 10 atm, resutling in reductin to 20% without  dissection. PDA 90% proximal stenosis-stented with a 2.5x9m Promus Element stent, deployed at 14 atm (2.374m resulting in reduction of 90% to 0% residual).  . CARDIAC CATHETERIZATION  05/03/2008   PLA 100% occluded proximal stenosis-stented with a [id number] stent deployed at 14 atm, resulting in reduction of 100% to 0% residual  . CARDIAC CATHETERIZATION  01/27/2005   RCA 80-85% stenosis, stenting performed with a [id number] DES, dilated to 3.75 with a 0% residual.  . CARDIOVASCULAR  STRESS TEST  07/16/2009   Mild-moderate perfusion defect seen in Basal inferior and Mid inferior regions. No scitigraphic evidence of inducible myocardial ischemia. No ECG changes. EKG negative for ischemia. Post-stress EF 56%.  . CHOLECYSTECTOMY  1985  . HERNIA REPAIR  1993, 2003  . TONSILLECTOMY  1946  . TOTAL KNEE ARTHROPLASTY Left 2004    Family History: Family History  Problem Relation Age of Onset  . Heart attack Father 39  73Diabetes Maternal Grandmother   . Stroke Maternal Grandmother 75  24Heart attack Brother 45  57Heart attack Maternal Grandfather 64  . Heart attack Paternal Grandmother   . Heart attack Paternal Grandfather   . Pulmonary embolism Paternal Grandfather   . Heart attack Brother 37  59Social History: Social History   Socioeconomic History  . Marital status: Widowed    Spouse name: Not on file  . Number of children: Not on file  . Years of education: Not on file  . Highest education level: Not on file  Occupational History  . Not on file  Social Needs  . Financial resource strain: Not on file  . Food insecurity:    Worry: Not on file    Inability: Not on file  . Transportation needs:    Medical: Not on file    Non-medical: Not on file  Tobacco Use  . Smoking status: Former Smoker    Last attempt to quit: 03/02/1973    Years since quitting: 44.8  . Smokeless tobacco: Never Used  Substance and Sexual Activity  . Alcohol use: Yes    Comment: social   . Drug use: Never  . Sexual activity: Not on file  Lifestyle  . Physical activity:    Days per week: Not on file    Minutes per session: Not on file  . Stress: Not on file  Relationships  . Social connections:    Talks on phone: Not on file    Gets together: Not on file    Attends religious service: Not on file    Active member of club or organization: Not on file    Attends meetings of clubs or organizations: Not on file    Relationship status: Not on file  Other Topics Concern  . Not on  file  Social History Narrative  . Not on file    Allergies:  No Known Allergies  Objective:    Vital Signs:   Temp:  [98.1 F (36.7 C)] 98.1 F (36.7 C) (06/23 2052) Pulse Rate:  [57-61] 58 (06/23 2200) Resp:  [16-28] 22 (06/23 2200) BP: (103-119)/(61-81) 114/74 (06/23 2200) SpO2:  [94 %-98 %] 96 % (06/23 2200)    Intake/Output:   Intake/Output Summary (Last 24 hours) at 12/13/2017 2304 Last data filed at 12/13/2017 2209 Gross per 24 hour  Intake 50 ml  Output -  Net 50 ml      Physical Exam    General:  Well appearing. No resp difficulty HEENT: normal Neck: supple. JVP .  Carotids 2+ bilat; no bruits. No lymphadenopathy or thyromegaly appreciated. Cor: PMI nondisplaced. Regular rate & rhythm. No rubs, gallops or murmurs. Lungs: clear Abdomen: soft, nontender, nondistended. No hepatosplenomegaly. No bruits or masses. Good bowel sounds. Extremities: no cyanosis, clubbing, rash, edema Neuro: alert & orientedx3, cranial nerves grossly intact. moves all 4 extremities w/o difficulty. Affect pleasant    EKG    ECG today - 61 bpm, sinus rhythm, old inferior infarct  Labs   Basic Metabolic Panel: Recent Labs  Lab 12/13/17 2057  NA 138  K 5.0  CL 103  CO2 24  GLUCOSE 166*  BUN 16  CREATININE 1.29*  CALCIUM 9.5    Liver Function Tests: Recent Labs  Lab 12/13/17 2057  AST 17  ALT 21  ALKPHOS 45  BILITOT 0.9  PROT 6.5  ALBUMIN 4.1   No results for input(s): LIPASE, AMYLASE in the last 168 hours. No results for input(s): AMMONIA in the last 168 hours.  CBC: Recent Labs  Lab 12/13/17 2057  WBC 5.3  HGB 13.9  HCT 41.9  MCV 91.5  PLT 262    Cardiac Enzymes: No results for input(s): CKTOTAL, CKMB, CKMBINDEX, TROPONINI in the last 168 hours.  BNP: BNP (last 3 results) No results for input(s): BNP in the last 8760 hours.  ProBNP (last 3 results) No results for input(s): PROBNP in the last 8760 hours.   CBG: No results for input(s): GLUCAP  in the last 168 hours.  Coagulation Studies: No results for input(s): LABPROT, INR in the last 72 hours.   Imaging   Dg Chest 2 View - 12/13/2017 FINDINGS: Stable mild cardiomegaly given projection and technique. Surgical clips in the right upper quadrant. No consolidation, effusion, or pneumothorax. Multilevel degenerative changes of the thoracic spine. IMPRESSION: Stable mild cardiomegaly.  No acute pulmonary process identified.    Myocardial perfusion scan - 09/21/2013 Low risk stress nuclear study with an old inferior wall scar and minimal inferoapical ischemia. Perfusion pattern and LVEF are similar to the old report.   Medications:     Current Medications: . [START ON 12/14/2017] aspirin  325 mg Oral Daily  . [START ON 12/14/2017] clopidogrel  75 mg Oral Daily  . [START ON 12/14/2017] famotidine  20 mg Oral Daily  . heparin  5,000 Units Subcutaneous Q8H  . [START ON 12/14/2017] insulin aspart  0-9 Units Subcutaneous Q4H  . [START ON 12/14/2017] lisinopril  40 mg Oral Daily  . metoprolol tartrate  50 mg Oral BID  . [START ON 12/14/2017] rosuvastatin  10 mg Oral q1800  . [START ON 12/14/2017] Vitamin D-3  2,000 Units Oral Daily     Assessment/Plan   Mr Hovis is a 77 year old gentleman with a history of coronary artery disease, remote tobacco use (quit >30 years ago), hypertension, hyperlipidemia and diabetes who is being evaluated for chest pain:  1. Coronary artery disease The patient had an episode of chest pain today that was relieved with S/L NTG. He has known CAD with multiple previous stents. He has noticed worsening exertional symptoms for a few months now. His initial ECG and troponin are unremarkable: - Admit for observation - Continue ASA and Plavix - Make NPO for possible cardiac catheterization in AM - Cycle cardiac biomarkers - Continue high-dose statins - Hold metformin in anticipation of contrast load in AM - IV hydration  2. Essential hypertension -  Continue lisinopril, metoprolol  3. Hyperlipidemia - Continue rosuvastatin     Meade Maw, MD  12/13/2017, 11:04 PM  Cardiology Overnight Team Please contact [facility] for night-coverage after hours (4p -7a ) and weekends on [email or web address]

## 2017-12-13 NOTE — ED Triage Notes (Signed)
C/o pain to center of chest and mild nausea x 30-45 min.  States symptoms feel like heartburn.  Scheduled for cardiac cath in July.

## 2017-12-13 NOTE — H&P (Signed)
History and Physical    KIMSEY DEMAREE EXN:[id number] DOB: 02/09/1941 DOA: 12/13/2017  [facility]   Patient coming from: Home   Chief Complaint: Chest pain   HPI: Joel Stanley is a 77 y.o. male with medical history significant for hypertension, OSA, chronic kidney disease stage III, type 2 diabetes mellitus, hyperlipidemia, and coronary artery disease with stents, now presenting to the emergency department for evaluation of chest pain.  Patient reports that he has been experiencing chronic exertional chest pain that had been stable, [facility], has been tentatively scheduled for cardiac cath next month, and was having an uneventful day when he developed chest pain this evening.  Patient reports that he had just finished eating dinner, was sitting down watching TV, when he developed acute onset of discomfort in the central chest.  There was no associated nausea, vomiting, or diaphoresis.  He took nitroglycerin x2, did not experience any immediate relief, but symptoms then eased off and completely resolved within about 30 to 45 minutes.  Denies cough or leg tenderness.  No recent fevers or chills.  ED Course: Upon arrival to the ED, patient is found to be afebrile, saturating well on room air, slightly bradycardic, and with stable blood pressure.  EKG features a normal sinus rhythm with left axis deviation.  Chest x-ray is notable for stable mild cardiomegaly, but no acute cardiopulmonary disease.  Chemistry panel features a creatinine of 1.29, consistent with his apparent baseline.  CBC is unremarkable and troponin is undetectable.  Cardiology was consulted by the ED physician, evaluated the patient in the emergency department, and recommended medical admission for possible catheterization in the morning.  Review of Systems:  All other systems reviewed and apart from HPI, are negative.  Past Medical History:  Diagnosis  Date  . Coronary artery disease   . Exertional angina (HCC)   . Family history of heart disease   . Hyperlipidemia   . Hypertension   . Obstructive sleep apnea   . Type 2 diabetes mellitus ([place])     Past Surgical History:  Procedure Laterality Date  . APPENDECTOMY  1969  . CARDIAC CATHETERIZATION  08/21/2010   PLA 75% fairly focal "in-stent" restenosis-cutting balloon atherectomy performed, expanded to 10 atm, resutling in reductin to 20% without dissection. PDA 90% proximal stenosis-stented with a 2.5x60m Promus Element stent, deployed at 14 atm (2.341m resulting in reduction of 90% to 0% residual).  . CARDIAC CATHETERIZATION  05/03/2008   PLA 100% occluded proximal stenosis-stented with a 2.75x2871mromus stent deployed at 14 atm, resulting in reduction of 100% to 0% residual  . CARDIAC CATHETERIZATION  01/27/2005   RCA 80-85% stenosis, stenting performed with a [id number] DES, dilated to 3.75 with a 0% residual.  . CARDIOVASCULAR STRESS TEST  07/16/2009   Mild-moderate perfusion defect seen in Basal inferior and Mid inferior regions. No scitigraphic evidence of inducible myocardial ischemia. No ECG changes. EKG negative for ischemia. Post-stress EF 56%.  . CHOLECYSTECTOMY  1985  . HERNIA REPAIR  1993, 2003  . TONSILLECTOMY  1946  . TOTAL KNEE ARTHROPLASTY Left 2004     reports that he quit smoking about 44 years ago. He has never used smokeless tobacco. He reports that he drinks alcohol. He reports that he does not use drugs.  No Known Allergies  Family History  Problem Relation Age of Onset  . Heart attack Father 39  18Diabetes Maternal Grandmother   . Stroke  Maternal Grandmother 77  . Heart attack Brother 48  . Heart attack Maternal Grandfather 64  . Heart attack Paternal Grandmother   . Heart attack Paternal Grandfather   . Pulmonary embolism Paternal Grandfather   . Heart attack Brother 37     Prior to Admission medications   Medication Sig Start Date End Date  Taking? Authorizing Provider  acetaminophen (TYLENOL) 650 MG CR tablet Take 650 mg by mouth 2 (two) times daily.    [provider]  aspirin 325 MG tablet Take 325 mg by mouth daily.    [provider]  Cholecalciferol (VITAMIN D-3) 1000 UNITS CAPS Take 2,000 Units by mouth daily.    [provider]  clopidogrel (PLAVIX) 75 MG tablet Take 75 mg by mouth daily.    [provider]  glucosamine-chondroitin 500-400 MG tablet Take 1 tablet by mouth. 3 tablets (1500/1200mg) twice a day    [provider]  lisinopril (PRINIVIL,ZESTRIL) 40 MG tablet Take 40 mg by mouth daily.    [provider]  metFORMIN (GLUCOPHAGE) 500 MG tablet Take 500 mg by mouth 2 (two) times daily with a meal.    [provider]  metoprolol (LOPRESSOR) 100 MG tablet Take 100 mg by mouth. Taking 1/2 tablet twice a day    [provider]  ranitidine (ZANTAC) 150 MG tablet Take 150 mg by mouth daily.    [provider]  rosuvastatin (CRESTOR) 10 MG tablet Take 10 mg by mouth daily.    [provider]    Physical Exam: Vitals:   12/13/17 2052 12/13/17 2130 12/13/17 2145 12/13/17 2200  BP: 119/81 103/64 114/61 114/74  Pulse: 61 (!) 58 (!) 57 (!) 58  Resp: 16 16 (!) 28 (!) 22  Temp: 98.1 F (36.7 C)     TempSrc: Oral     SpO2: 98% 94% 97% 96%      Constitutional: NAD, calm  Eyes: PERTLA, lids and conjunctivae normal ENMT: Mucous membranes are moist. Posterior pharynx clear of any exudate or lesions.   Neck: normal, supple, no masses, no thyromegaly Respiratory: clear to auscultation bilaterally, no wheezing, no crackles. Normal respiratory effort.    Cardiovascular: S1 & S2 heard, regular rate and rhythm. Trace pedal edema. No significant JVD. Abdomen: No distension, no tenderness, soft. Bowel sounds normal.  Musculoskeletal: no clubbing / cyanosis. No joint deformity upper and lower extremities.   Skin: no significant rashes,  lesions, ulcers. Warm, dry, well-perfused. Neurologic: CN 2-12 grossly intact. Sensation intact. Strength 5/5 in all 4 limbs.  Psychiatric: Alert and oriented x 3. Calm, cooperative.     Labs on Admission: I have personally reviewed following labs and imaging studies  CBC: Recent Labs  Lab 12/13/17 2057  WBC 5.3  HGB 13.9  HCT 41.9  MCV 91.5  PLT 103   Basic Metabolic Panel: Recent Labs  Lab 12/13/17 2057  NA 138  K 5.0  CL 103  CO2 24  GLUCOSE 166*  BUN 16  CREATININE 1.29*  CALCIUM 9.5   GFR: CrCl cannot be calculated (Unknown ideal weight.). Liver Function Tests: Recent Labs  Lab 12/13/17 2057  AST 17  ALT 21  ALKPHOS 45  BILITOT 0.9  PROT 6.5  ALBUMIN 4.1   No results for input(s): LIPASE, AMYLASE in the last 168 hours. No results for input(s): AMMONIA in the last 168 hours. Coagulation Profile: No results for input(s): INR, PROTIME in the last 168 hours. Cardiac Enzymes: No results for input(s): CKTOTAL, CKMB,  CKMBINDEX, TROPONINI in the last 168 hours. BNP (last 3 results) No results for input(s): PROBNP in the last 8760 hours. HbA1C: No results for input(s): HGBA1C in the last 72 hours. CBG: No results for input(s): GLUCAP in the last 168 hours. Lipid Profile: No results for input(s): CHOL, HDL, LDLCALC, TRIG, CHOLHDL, LDLDIRECT in the last 72 hours. Thyroid Function Tests: No results for input(s): TSH, T4TOTAL, FREET4, T3FREE, THYROIDAB in the last 72 hours. Anemia Panel: No results for input(s): VITAMINB12, FOLATE, FERRITIN, TIBC, IRON, RETICCTPCT in the last 72 hours. Urine analysis:    Component Value Date/Time   COLORURINE YELLOW 04/30/2008 2316   APPEARANCEUR CLEAR 04/30/2008 2316   LABSPEC 1.019 04/30/2008 2316   PHURINE 6.5 04/30/2008 2316   GLUCOSEU NEGATIVE 04/30/2008 2316   HGBUR NEGATIVE 04/30/2008 2316   BILIRUBINUR NEGATIVE 04/30/2008 2316   KETONESUR NEGATIVE 04/30/2008 2316   PROTEINUR NEGATIVE 04/30/2008 2316    UROBILINOGEN 1.0 04/30/2008 2316   NITRITE NEGATIVE 04/30/2008 2316   LEUKOCYTESUR  04/30/2008 2316    NEGATIVE MICROSCOPIC NOT DONE ON URINES WITH NEGATIVE PROTEIN, BLOOD, LEUKOCYTES, NITRITE, OR GLUCOSE <1000 mg/dL.   Sepsis Labs: '@LABRCNTIP' (procalcitonin:4,lacticidven:4) )No results found for this or any previous visit (from the past 240 hour(s)).   Radiological Exams on Admission: Dg Chest 2 View  Result Date: 12/13/2017 CLINICAL DATA:  77 y/o  M; mid to left chest pain this afternoon. EXAM: CHEST - 2 VIEW COMPARISON:  08/21/2010 chest radiograph FINDINGS: Stable mild cardiomegaly given projection and technique. Surgical clips in the right upper quadrant. No consolidation, effusion, or pneumothorax. Multilevel degenerative changes of the thoracic spine. IMPRESSION: Stable mild cardiomegaly.  No acute pulmonary process identified. Electronically Signed   By: Kristine Garbe M.D.   On: 12/13/2017 21:42    EKG: Independently reviewed. Sinus rhythm, LAD.   Assessment/Plan   1. Chest pain; CAD  - Presents after a transient episode of chest pain while at rest, not improved immediately after NTG x2 at home, but pain then resolved within 30-45 minutes - Hx of CAD with stents, reports recent stress-test abnormality and tentatively scheduled for cath next month  - CXR is unremarkable, no acute ischemic features on EKG, initial troponin undetectable  - He took ASA 325 mg today prior to arrival  - Cardiololgy is consulting and much appreciated  - Continue cardiac monitoring, obtain serial troponin measurements, repeat EKG, continue ASA, Plavix, statin, ACE, and beta-blocker    2. Hypertension  - BP at goal  - Continue lisinopril and Lopressor as tolerated   3. Type II DM  - A1c was 7.3% remotely  - Managed at home with metformin only, held on admission   - Follow CBGs and use a low-intensity SSI with Novolog while in hospital    4. CKD stage III  - SCr is 1.29 on admission,  consistent with his apparent baseline  - Renally-dose medications, avoid nephrotoxins, gentle IVF hydration overnight while NPO for possible cath     DVT prophylaxis: sq heparin  Code Status: Full  Family Communication: Discussed with patient  Consults called: Cardiology Admission status: Observation     Vianne Bulls, MD [facility] Pager [phone]  If 7PM-7AM, please contact night-coverage [email or web address] Password [email or web address]  12/13/2017, 10:57 PM

## 2017-12-14 ENCOUNTER — Encounter (HOSPITAL_COMMUNITY)
Admission: EM | Disposition: A | Discharge status: Home / Self Care | Payer: Self-pay | Source: Home / Self Care | Attending: Emergency Medicine

## 2017-12-14 ENCOUNTER — Other Ambulatory Visit: Payer: Self-pay

## 2017-12-14 DIAGNOSIS — I10 Essential (primary) hypertension: Secondary | ICD-10-CM

## 2017-12-14 DIAGNOSIS — N183 Chronic kidney disease, stage 3 (moderate): Secondary | ICD-10-CM

## 2017-12-14 DIAGNOSIS — Z955 Presence of coronary angioplasty implant and graft: Secondary | ICD-10-CM | POA: Diagnosis not present

## 2017-12-14 DIAGNOSIS — E113213 Type 2 diabetes mellitus with mild nonproliferative diabetic retinopathy with macular edema, bilateral: Secondary | ICD-10-CM

## 2017-12-14 DIAGNOSIS — I25110 Atherosclerotic heart disease of native coronary artery with unstable angina pectoris: Secondary | ICD-10-CM | POA: Diagnosis not present

## 2017-12-14 DIAGNOSIS — I200 Unstable angina: Secondary | ICD-10-CM | POA: Diagnosis not present

## 2017-12-14 DIAGNOSIS — E1122 Type 2 diabetes mellitus with diabetic chronic kidney disease: Secondary | ICD-10-CM | POA: Diagnosis not present

## 2017-12-14 DIAGNOSIS — I129 Hypertensive chronic kidney disease with stage 1 through stage 4 chronic kidney disease, or unspecified chronic kidney disease: Secondary | ICD-10-CM | POA: Diagnosis not present

## 2017-12-14 HISTORY — PX: LEFT HEART CATH AND CORONARY ANGIOGRAPHY: CATH118249

## 2017-12-14 LAB — CBC
HEMATOCRIT: 40.4 % (ref 39.0–52.0)
Hemoglobin: 13.3 g/dL (ref 13.0–17.0)
MCH: 30.1 pg (ref 26.0–34.0)
MCHC: 32.9 g/dL (ref 30.0–36.0)
MCV: 91.4 fL (ref 78.0–100.0)
Platelets: 229 10*3/uL (ref 150–400)
RBC: 4.42 MIL/uL (ref 4.22–5.81)
RDW: 12.5 % (ref 11.5–15.5)
WBC: 4.7 10*3/uL (ref 4.0–10.5)

## 2017-12-14 LAB — BASIC METABOLIC PANEL
Anion gap: 10 (ref 5–15)
BUN: 16 mg/dL (ref 6–20)
CHLORIDE: 106 mmol/L (ref 101–111)
CO2: 25 mmol/L (ref 22–32)
Calcium: 9 mg/dL (ref 8.9–10.3)
Creatinine, Ser: 1.11 mg/dL (ref 0.61–1.24)
GFR calc Af Amer: >60 mL/min (ref >60)
GFR calc non Af Amer: >60 mL/min (ref >60)
GLUCOSE: 145 mg/dL — AB (ref 65–99)
POTASSIUM: 4.1 mmol/L (ref 3.5–5.1)
SODIUM: 141 mmol/L (ref 135–145)

## 2017-12-14 LAB — GLUCOSE, CAPILLARY
GLUCOSE-CAPILLARY: 153 mg/dL — AB (ref 65–99)
GLUCOSE-CAPILLARY: 160 mg/dL — AB (ref 65–99)
Glucose-Capillary: 139 mg/dL — ABNORMAL HIGH (ref 65–99)
Glucose-Capillary: 132 mg/dL — ABNORMAL HIGH (ref 65–99)

## 2017-12-14 LAB — TROPONIN I
Troponin I: <0.03 ng/mL (ref <0.03)
Troponin I: <0.03 ng/mL (ref <0.03)
Troponin I: <0.03 ng/mL (ref <0.03)

## 2017-12-14 SURGERY — LEFT HEART CATH AND CORONARY ANGIOGRAPHY
Anesthesia: LOCAL

## 2017-12-14 MED ORDER — SODIUM CHLORIDE 0.9% FLUSH
3.0000 mL | Freq: Two times a day (BID) | INTRAVENOUS | Status: DC
  Administered 2017-12-14: 3 mL via INTRAVENOUS

## 2017-12-14 MED ORDER — NITROGLYCERIN 0.4 MG SL SUBL: 0.4000 mg | SUBLINGUAL_TABLET | SUBLINGUAL | 0 refills | Status: DC | PRN

## 2017-12-14 MED ORDER — MIDAZOLAM HCL 2 MG/2ML IJ SOLN
INTRAMUSCULAR | Status: DC | PRN
  Administered 2017-12-14: 1 mg via INTRAVENOUS

## 2017-12-14 MED ORDER — HEPARIN (PORCINE) IN NACL 2-0.9 UNITS/ML
INTRAMUSCULAR | Status: AC | PRN
  Administered 2017-12-14: 1000 mL

## 2017-12-14 MED ORDER — LIDOCAINE HCL (PF) 1 % IJ SOLN
INTRAMUSCULAR | Status: DC | PRN
  Administered 2017-12-14: 2 mL

## 2017-12-14 MED ORDER — FENTANYL CITRATE (PF) 100 MCG/2ML IJ SOLN
INTRAMUSCULAR | Status: DC | PRN
  Administered 2017-12-14: 25 ug via INTRAVENOUS

## 2017-12-14 MED ORDER — SODIUM CHLORIDE 0.9 % IV SOLN: 250.0000 mL | INTRAVENOUS | Status: DC | PRN

## 2017-12-14 MED ORDER — HEPARIN SODIUM (PORCINE) 1000 UNIT/ML IJ SOLN
INTRAMUSCULAR | Status: DC | PRN
  Administered 2017-12-14: 4500 [IU] via INTRAVENOUS

## 2017-12-14 MED ORDER — SODIUM CHLORIDE 0.9 % IV SOLN: INTRAVENOUS | Status: DC

## 2017-12-14 MED ORDER — MIDAZOLAM HCL 2 MG/2ML IJ SOLN
INTRAMUSCULAR | Status: AC
  Filled 2017-12-14: qty 2

## 2017-12-14 MED ORDER — INSULIN ASPART 100 UNIT/ML ~~LOC~~ SOLN: 0.0000 [IU] | Freq: Three times a day (TID) | SUBCUTANEOUS | Status: DC

## 2017-12-14 MED ORDER — ONDANSETRON HCL 4 MG/2ML IJ SOLN
4.0000 mg | Freq: Four times a day (QID) | INTRAMUSCULAR | Status: DC | PRN
Start: 2017-12-14 — End: 2017-12-14

## 2017-12-14 MED ORDER — ACETAMINOPHEN 325 MG PO TABS: 650.0000 mg | ORAL_TABLET | ORAL | Status: DC | PRN

## 2017-12-14 MED ORDER — HEPARIN SODIUM (PORCINE) 1000 UNIT/ML IJ SOLN
INTRAMUSCULAR | Status: AC
  Filled 2017-12-14: qty 1

## 2017-12-14 MED ORDER — LIDOCAINE HCL (PF) 1 % IJ SOLN
INTRAMUSCULAR | Status: AC
  Filled 2017-12-14: qty 30

## 2017-12-14 MED ORDER — SODIUM CHLORIDE 0.9 % WEIGHT BASED INFUSION: 3.0000 mL/kg/h | INTRAVENOUS | Status: DC

## 2017-12-14 MED ORDER — ASPIRIN 81 MG PO CHEW
81.0000 mg | CHEWABLE_TABLET | ORAL | Status: AC
  Administered 2017-12-14: 81 mg via ORAL
  Filled 2017-12-14: qty 1

## 2017-12-14 MED ORDER — CLOPIDOGREL BISULFATE 75 MG PO TABS: 75.0000 mg | ORAL_TABLET | Freq: Every day | ORAL | Status: DC

## 2017-12-14 MED ORDER — VERAPAMIL HCL 2.5 MG/ML IV SOLN
INTRAVENOUS | Status: AC
  Filled 2017-12-14: qty 2

## 2017-12-14 MED ORDER — IOPAMIDOL (ISOVUE-370) INJECTION 76%
INTRAVENOUS | Status: AC
  Filled 2017-12-14: qty 100

## 2017-12-14 MED ORDER — SODIUM CHLORIDE 0.9% FLUSH: 3.0000 mL | Freq: Two times a day (BID) | INTRAVENOUS | Status: DC

## 2017-12-14 MED ORDER — INSULIN ASPART 100 UNIT/ML ~~LOC~~ SOLN: 0.0000 [IU] | Freq: Every day | SUBCUTANEOUS | Status: DC

## 2017-12-14 MED ORDER — SODIUM CHLORIDE 0.9% FLUSH: 3.0000 mL | INTRAVENOUS | Status: DC | PRN

## 2017-12-14 MED ORDER — ASPIRIN 81 MG PO CHEW: 81.0000 mg | CHEWABLE_TABLET | Freq: Every day | ORAL | Status: AC

## 2017-12-14 MED ORDER — SODIUM CHLORIDE 0.9 % WEIGHT BASED INFUSION: 1.0000 mL/kg/h | INTRAVENOUS | Status: DC

## 2017-12-14 MED ORDER — HEPARIN (PORCINE) IN NACL 1000-0.9 UT/500ML-% IV SOLN
INTRAVENOUS | Status: AC
  Filled 2017-12-14: qty 1000

## 2017-12-14 MED ORDER — VERAPAMIL HCL 2.5 MG/ML IV SOLN
INTRA_ARTERIAL | Status: DC | PRN
  Administered 2017-12-14: 10 mL via INTRA_ARTERIAL

## 2017-12-14 MED ORDER — FENTANYL CITRATE (PF) 100 MCG/2ML IJ SOLN
INTRAMUSCULAR | Status: AC
  Filled 2017-12-14: qty 2

## 2017-12-14 MED ORDER — IOPAMIDOL (ISOVUE-370) INJECTION 76%
INTRAVENOUS | Status: DC | PRN
  Administered 2017-12-14: 65 mL via INTRA_ARTERIAL

## 2017-12-14 MED ORDER — ASPIRIN 81 MG PO CHEW: 81.0000 mg | CHEWABLE_TABLET | Freq: Every day | ORAL | Status: DC

## 2017-12-14 MED ORDER — METFORMIN HCL 500 MG PO TABS: 500.0000 mg | ORAL_TABLET | Freq: Two times a day (BID) | ORAL | Status: AC

## 2017-12-14 MED ORDER — NITROGLYCERIN 1 MG/10 ML FOR IR/CATH LAB
INTRA_ARTERIAL | Status: AC
  Filled 2017-12-14: qty 10

## 2017-12-14 MED ORDER — MORPHINE SULFATE (PF) 2 MG/ML IV SOLN: 2.0000 mg | INTRAVENOUS | Status: DC | PRN

## 2017-12-14 SURGICAL SUPPLY — 13 items
CATH INFINITI 5 FR JL3.5 (CATHETERS) ×1 IMPLANT
CATH INFINITI 5FR ANG PIGTAIL (CATHETERS) ×2 IMPLANT
CATH OPTITORQUE TIG 4.0 5F (CATHETERS) ×1 IMPLANT
DEVICE RAD COMP TR BAND LRG (VASCULAR PRODUCTS) ×1 IMPLANT
GLIDESHEATH SLEND A-KIT 6F 22G (SHEATH) ×1 IMPLANT
GUIDEWIRE INQWIRE 1.5J.035X260 (WIRE) IMPLANT
INQWIRE 1.5J .035X260CM (WIRE) ×2
KIT HEART LEFT (KITS) ×2 IMPLANT
PACK CARDIAC CATHETERIZATION (CUSTOM PROCEDURE TRAY) ×2 IMPLANT
SYR MEDRAD MARK V 150ML (SYRINGE) ×2 IMPLANT
TRANSDUCER W/STOPCOCK (MISCELLANEOUS) ×2 IMPLANT
TUBING CIL FLEX 10 FLL-RA (TUBING) ×2 IMPLANT
WIRE HI TORQ VERSACORE-J 145CM (WIRE) ×1 IMPLANT

## 2017-12-14 NOTE — Discharge Instructions (Signed)
No driving for 24 hours. No lifting over 5 lbs for 1 week. No sexual activity for 1 week. Keep procedure site clean & dry. If you notice increased pain, swelling, bleeding or pus, call/return!  You may shower, but no soaking baths/hot tubs/pools for 1 week.  ° ° °

## 2017-12-14 NOTE — Discharge Summary (Signed)
Physician Discharge Summary  Joel PoissonJerry W Chovan ZOX:[id number] DOB: [birth year] DOA: 12/13/2017  [facility]  Admit date: 12/13/2017 Discharge date: 12/14/2017  Admitted From: home Discharge disposition: home   Recommendations for Outpatient Follow-Up:   1. Cardiology follow up arranged 2. BMP at next office visit   Discharge Diagnosis:   Principal Problem:   Chest pain Active Problems:   CAD (coronary artery disease)   Essential hypertension   Type 2 diabetes mellitus (HCC)   Obstructive sleep apnea   CKD (chronic kidney disease), stage III (HCC)    Discharge Condition: Improved.  Diet recommendation: Low sodium, heart healthy  Wound care: None.  Code status: Full.   History of Present Illness:   Joel Stanley is a 77 y.o. male with medical history significant for hypertension, OSA, chronic kidney disease stage III, type 2 diabetes mellitus, hyperlipidemia, and coronary artery disease with stents, now presenting to the emergency department for evaluation of chest pain.  Patient reports that he has been experiencing chronic exertional chest pain that had been stable, [facility], has been tentatively scheduled for cardiac cath next month, and was having an uneventful day when he developed chest pain this evening.  Patient reports that he had just finished eating dinner, was sitting down watching TV, when he developed acute onset of discomfort in the central chest.  There was no associated nausea, vomiting, or diaphoresis.  He took nitroglycerin x2, did not experience any immediate relief, but symptoms then eased off and completely resolved within about 30 to 45 minutes.  Denies cough or leg tenderness.  No recent fevers or chills.     Hospital Course by Problem:   Chest pain/unstable angina -cath stable -CE negative Asa/plavix  Hypertension  - BP at goal  - Continue lisinopril and  Lopressor as tolerated   Type II DM  - A1c was 7.3% remotely  - Managed at home with metformin only- resume on thursday  CKD stage III  - SCr is 1.29 on admission, consistent with his apparent baseline        Medical Consultants:      Discharge Exam:   Vitals:   12/14/17 1455 12/14/17 1515  BP: 138/78 129/79  Pulse: (!) 55 (!) 52  Resp: (!) 58 18  Temp:    SpO2: 99% 98%   Vitals:   12/14/17 1445 12/14/17 1450 12/14/17 1455 12/14/17 1515  BP: 122/79 126/76 138/78 129/79  Pulse: 73 (!) 56 (!) 55 (!) 52  Resp: 19 16 (!) 58 18  Temp:      TempSrc:      SpO2: 96% 97% 99% 98%  Weight:      Height:        General exam: Appears calm and comfortable.   The results of significant diagnostics from this hospitalization (including imaging, microbiology, ancillary and laboratory) are listed below for reference.     Procedures and Diagnostic Studies:   Dg Chest 2 View  Result Date: 12/13/2017 CLINICAL DATA:  77 y/o  M; mid to left chest pain this afternoon. EXAM: CHEST - 2 VIEW COMPARISON:  08/21/2010 chest radiograph FINDINGS: Stable mild cardiomegaly given projection and technique. Surgical clips in the right upper quadrant. No consolidation, effusion, or pneumothorax. Multilevel degenerative changes of the thoracic spine. IMPRESSION: Stable mild cardiomegaly.  No acute pulmonary process identified. Electronically Signed   By: Mitzi HansenLance  Furusawa-Stratton M.D.   On: 12/13/2017 21:42  Labs:   Basic Metabolic Panel: Recent Labs  Lab 12/13/17 2057 12/14/17 0631  NA 138 141  K 5.0 4.1  CL 103 106  CO2 24 25  GLUCOSE 166* 145*  BUN 16 16  CREATININE 1.29* 1.11  CALCIUM 9.5 9.0   GFR Estimated Creatinine Clearance: 62.1 mL/min (by C-G formula based on SCr of 1.11 mg/dL). Liver Function Tests: Recent Labs  Lab 12/13/17 2057  AST 17  ALT 21  ALKPHOS 45  BILITOT 0.9  PROT 6.5  ALBUMIN 4.1   No results for input(s): LIPASE, AMYLASE in the last 168  hours. No results for input(s): AMMONIA in the last 168 hours. Coagulation profile No results for input(s): INR, PROTIME in the last 168 hours.  CBC: Recent Labs  Lab 12/13/17 2057 12/14/17 0631  WBC 5.3 4.7  HGB 13.9 13.3  HCT 41.9 40.4  MCV 91.5 91.4  PLT 262 229   Cardiac Enzymes: Recent Labs  Lab 12/14/17 0051 12/14/17 0631 12/14/17 1242  TROPONINI <0.03 <0.03 <0.03   BNP: Invalid input(s): POCBNP CBG: Recent Labs  Lab 12/14/17 0000 12/14/17 0438 12/14/17 0719 12/14/17 1124  GLUCAP 160* 139* 153* 132*   D-Dimer No results for input(s): DDIMER in the last 72 hours. Hgb A1c No results for input(s): HGBA1C in the last 72 hours. Lipid Profile No results for input(s): CHOL, HDL, LDLCALC, TRIG, CHOLHDL, LDLDIRECT in the last 72 hours. Thyroid function studies No results for input(s): TSH, T4TOTAL, T3FREE, THYROIDAB in the last 72 hours.  Invalid input(s): FREET3 Anemia work up No results for input(s): VITAMINB12, FOLATE, FERRITIN, TIBC, IRON, RETICCTPCT in the last 72 hours. Microbiology No results found for this or any previous visit (from the past 240 hour(s)).   Discharge Instructions:   Discharge Instructions    Diet - low sodium heart healthy   Complete by:  As directed    Diet Carb Modified   Complete by:  As directed    Discharge instructions   Complete by:  As directed    Hold metformin/glucophage until Thursday   Increase activity slowly   Complete by:  As directed      Allergies as of 12/14/2017   No Known Allergies     Medication List    STOP taking these medications   aspirin 325 MG tablet Replaced by:  aspirin 81 MG chewable tablet     TAKE these medications   acetaminophen 650 MG CR tablet Commonly known as:  TYLENOL Take 650 mg by mouth 2 (two) times daily.   aspirin 81 MG chewable tablet Chew 1 tablet (81 mg total) by mouth daily. Start taking on:  12/15/2017 Replaces:  aspirin 325 MG tablet   clopidogrel 75 MG  tablet Commonly known as:  PLAVIX Take 75 mg by mouth daily.   lisinopril 40 MG tablet Commonly known as:  PRINIVIL,ZESTRIL Take 40 mg by mouth 2 (two) times daily.   metFORMIN 500 MG tablet Commonly known as:  GLUCOPHAGE Take 1 tablet (500 mg total) by mouth 2 (two) times daily with a meal. Start taking on:  12/17/2017 What changed:  These instructions start on 12/17/2017. If you are unsure what to do until then, ask your doctor or other care provider.   metoprolol tartrate 100 MG tablet Commonly known as:  LOPRESSOR Take 50 mg by mouth 2 (two) times daily. Taking 1/2 tablet twice a day   nitroGLYCERIN 0.4 MG SL tablet Commonly known as:  NITROSTAT Place 1 tablet (0.4 mg total) under the  tongue every 5 (five) minutes x 3 doses as needed for chest pain.   ranitidine 150 MG tablet Commonly known as:  ZANTAC Take 150 mg by mouth daily.   rosuvastatin 10 MG tablet Commonly known as:  CRESTOR Take 10 mg by mouth daily.   Vitamin D-3 1000 units Caps Take 2,000 Units by mouth daily.      Follow-up Information    [facility] 1 week(s).   Contact information: [address] [place] [address] [phone]        [name], MD .   Specialties:  Cardiology, Radiology Contact information: [address] [place] [address] [phone]            Time coordinating discharge: 25 min  Signed:  Joseph Art  [facility] 12/14/2017, 3:44 PM

## 2017-12-14 NOTE — Progress Notes (Signed)
Progress Note    Joel Stanley  WUJ:[id number] DOB: 04-18-1941  DOA: 12/13/2017 [facility]    Brief Narrative:     Medical records reviewed and are as summarized below:  Joel Stanley is an 77 y.o. male with medical history significant for hypertension, OSA, chronic kidney disease stage III, type 2 diabetes mellitus, hyperlipidemia, and coronary artery disease with stents, now presenting to the emergency department for evaluation of chest pain.  Patient reports that he has been experiencing chronic exertional chest pain that had been stable, [facility], has been tentatively scheduled for cardiac cath next month, and was having an uneventful day when he developed chest pain this evening.  Patient reports that he had just finished eating dinner, was sitting down watching TV, when he developed acute onset of discomfort in the central chest.  There was no associated nausea, vomiting, or diaphoresis.  He took nitroglycerin x2, did not experience any immediate relief, but symptoms then eased off and completely resolved within about 30 to 45 minutes.     Assessment/Plan:   Principal Problem:   Chest pain Active Problems:   CAD (coronary artery disease)   Essential hypertension   Type 2 diabetes mellitus (HCC)   Obstructive sleep apnea   CKD (chronic kidney disease), stage III (HCC)  Chest pain; CAD  - Presents after a transient episode of chest pain while at rest, not improved immediately after NTG x2 at home, but pain then resolved within 30-45 minutes - Hx of CAD with stents, reports recent stress-test abnormality and tentatively scheduled for cath next month  - CXR is unremarkable, no acute ischemic features on EKG, initial troponin undetectable  - cards for cath  Hypertension  - BP at goal  - Continue lisinopril and Lopressor as tolerated   Type II DM  - A1c was 7.3% remotely  -SSI  CKD stage  III  - SCr is 1.29 on admission, consistent with his apparent baseline  - Renally-dose medications, avoid nephrotoxins, gentle IVF hydration for cath     Family Communication/Anticipated D/C date and plan/Code Status   DVT prophylaxis: Lovenox ordered. Code Status: Full Code.  Family Communication:  Disposition Plan: pending cath   Medical Consultants:    cards    Subjective:   No current chest pain  Objective:    Vitals:   12/14/17 0003 12/14/17 0021 12/14/17 0441 12/14/17 1123  BP: 137/85  139/85 130/85  Pulse: (!) 56  (!) 50 (!) 50  Resp: 16  16 20   Temp: 97.8 F (36.6 C)  98.1 F (36.7 C) 98 F (36.7 C)  TempSrc: Oral  Oral Oral  SpO2: 97%  98% 99%  Weight:  92.8 kg (204 lb 9.6 oz)    Height:  6' (1.829 m)      Intake/Output Summary (Last 24 hours) at 12/14/2017 1140 Last data filed at 12/14/2017 0900 Gross per 24 hour  Intake 579.23 ml  Output 950 ml  Net -370.77 ml   Filed Weights   12/14/17 0021  Weight: 92.8 kg (204 lb 9.6 oz)    Exam: In bed, NAD No increased work of breathing No LE edema No rashes or lesion A+Ox3  Data Reviewed:   I have personally reviewed following labs and imaging studies:  Labs: Labs show the following:   Basic Metabolic Panel: Recent Labs  Lab 12/13/17 2057 12/14/17 0631  NA 138 141  K 5.0 4.1  CL  103 106  CO2 24 25  GLUCOSE 166* 145*  BUN 16 16  CREATININE 1.29* 1.11  CALCIUM 9.5 9.0   GFR Estimated Creatinine Clearance: 62.1 mL/min (by C-G formula based on SCr of 1.11 mg/dL). Liver Function Tests: Recent Labs  Lab 12/13/17 2057  AST 17  ALT 21  ALKPHOS 45  BILITOT 0.9  PROT 6.5  ALBUMIN 4.1   No results for input(s): LIPASE, AMYLASE in the last 168 hours. No results for input(s): AMMONIA in the last 168 hours. Coagulation profile No results for input(s): INR, PROTIME in the last 168 hours.  CBC: Recent Labs  Lab 12/13/17 2057 12/14/17 0631  WBC 5.3 4.7  HGB 13.9 13.3  HCT 41.9  40.4  MCV 91.5 91.4  PLT 262 229   Cardiac Enzymes: Recent Labs  Lab 12/14/17 0051 12/14/17 0631  TROPONINI <0.03 <0.03   BNP (last 3 results) No results for input(s): PROBNP in the last 8760 hours. CBG: Recent Labs  Lab 12/14/17 0000 12/14/17 0438 12/14/17 0719 12/14/17 1124  GLUCAP 160* 139* 153* 132*   D-Dimer: No results for input(s): DDIMER in the last 72 hours. Hgb A1c: No results for input(s): HGBA1C in the last 72 hours. Lipid Profile: No results for input(s): CHOL, HDL, LDLCALC, TRIG, CHOLHDL, LDLDIRECT in the last 72 hours. Thyroid function studies: No results for input(s): TSH, T4TOTAL, T3FREE, THYROIDAB in the last 72 hours.  Invalid input(s): FREET3 Anemia work up: No results for input(s): VITAMINB12, FOLATE, FERRITIN, TIBC, IRON, RETICCTPCT in the last 72 hours. Sepsis Labs: Recent Labs  Lab 12/13/17 2057 12/14/17 0631  WBC 5.3 4.7    Microbiology No results found for this or any previous visit (from the past 240 hour(s)).  Procedures and diagnostic studies:  Dg Chest 2 View  Result Date: 12/13/2017 CLINICAL DATA:  77 y/o  M; mid to left chest pain this afternoon. EXAM: CHEST - 2 VIEW COMPARISON:  08/21/2010 chest radiograph FINDINGS: Stable mild cardiomegaly given projection and technique. Surgical clips in the right upper quadrant. No consolidation, effusion, or pneumothorax. Multilevel degenerative changes of the thoracic spine. IMPRESSION: Stable mild cardiomegaly.  No acute pulmonary process identified. Electronically Signed   By: Mitzi HansenLance  Furusawa-Stratton M.D.   On: 12/13/2017 21:42    Medications:   . aspirin  325 mg Oral Daily  . cholecalciferol  2,000 Units Oral Daily  . clopidogrel  75 mg Oral Daily  . famotidine  20 mg Oral Daily  . heparin  5,000 Units Subcutaneous Q8H  . insulin aspart  0-9 Units Subcutaneous Q4H  . lisinopril  40 mg Oral Daily  . metoprolol tartrate  50 mg Oral BID  . rosuvastatin  10 mg Oral q1800  . sodium  chloride flush  3 mL Intravenous Q12H   Continuous Infusions: . sodium chloride    . sodium chloride 1 mL/kg/hr (12/14/17 0851)     LOS: 0 days   Joseph ArtJessica U Vann  [facility]   *Please refer to [email or web address], password [email or web address] to get updated schedule on who will round on this patient, as hospitalists switch teams weekly. If 7PM-7AM, please contact night-coverage at [email or web address], password [email or web address] for any overnight needs.  12/14/2017, 11:40 AM

## 2017-12-14 NOTE — H&P (View-Only) (Signed)
Progress Note  Patient Name: Joel Stanley Date of Encounter: 12/14/2017  Primary Cardiologist: Nanetta Batty, MD   Subjective   Had chest pain yesterday, symptom similar to previous angina. Total duration 5 min, subsided after 2 nitro. Was set up with a cardiologist at [facility] hospital on 01/19/2018 to discuss further plan.   Inpatient Medications    Scheduled Meds: . aspirin  325 mg Oral Daily  . cholecalciferol  2,000 Units Oral Daily  . clopidogrel  75 mg Oral Daily  . famotidine  20 mg Oral Daily  . heparin  5,000 Units Subcutaneous Q8H  . insulin aspart  0-9 Units Subcutaneous Q4H  . lisinopril  40 mg Oral Daily  . metoprolol tartrate  50 mg Oral BID  . rosuvastatin  10 mg Oral q1800   Continuous Infusions: . sodium chloride 85 mL/hr at 12/14/17 0642   PRN Meds: acetaminophen, gi cocktail, morphine injection, nitroGLYCERIN, ondansetron (ZOFRAN) IV, zolpidem   Vital Signs    Vitals:   12/13/17 2330 12/14/17 0003 12/14/17 0021 12/14/17 0441  BP: 120/75 137/85  139/85  Pulse: 60 (!) 56  (!) 50  Resp: 16 16  16   Temp:  97.8 F (36.6 C)  98.1 F (36.7 C)  TempSrc:  Oral  Oral  SpO2: 97% 97%  98%  Weight:   204 lb 9.6 oz (92.8 kg)   Height:   6' (1.829 m)     Intake/Output Summary (Last 24 hours) at 12/14/2017 0753 Last data filed at 12/14/2017 1610 Gross per 24 hour  Intake 579.23 ml  Output 750 ml  Net -170.77 ml   Filed Weights   12/14/17 0021  Weight: 204 lb 9.6 oz (92.8 kg)    Telemetry    Sinus bradycardia - Personally Reviewed  ECG    Sinus brady with q wave in inferior leads - Personally Reviewed  Physical Exam   GEN: No acute distress.   Neck: No JVD Cardiac: RRR, no murmurs, rubs, or gallops.  Respiratory: Clear to auscultation bilaterally. GI: Soft, nontender, non-distended  MS: No edema; No deformity. Neuro:  Nonfocal  Psych: Normal affect   Labs    Chemistry Recent Labs  Lab 12/13/17 2057  NA 138  K 5.0  CL 103  CO2 24   GLUCOSE 166*  BUN 16  CREATININE 1.29*  CALCIUM 9.5  PROT 6.5  ALBUMIN 4.1  AST 17  ALT 21  ALKPHOS 45  BILITOT 0.9  GFRNONAA 52*  GFRAA >60  ANIONGAP 11     Hematology Recent Labs  Lab 12/13/17 2057  WBC 5.3  RBC 4.58  HGB 13.9  HCT 41.9  MCV 91.5  MCH 30.3  MCHC 33.2  RDW 12.5  PLT 262    Cardiac Enzymes Recent Labs  Lab 12/14/17 0051  TROPONINI <0.03    Recent Labs  Lab 12/13/17 2100  TROPIPOC 0.00     BNPNo results for input(s): BNP, PROBNP in the last 168 hours.   DDimer No results for input(s): DDIMER in the last 168 hours.   Radiology    Dg Chest 2 View  Result Date: 12/13/2017 CLINICAL DATA:  77 y/o  M; mid to left chest pain this afternoon. EXAM: CHEST - 2 VIEW COMPARISON:  08/21/2010 chest radiograph FINDINGS: Stable mild cardiomegaly given projection and technique. Surgical clips in the right upper quadrant. No consolidation, effusion, or pneumothorax. Multilevel degenerative changes of the thoracic spine. IMPRESSION: Stable mild cardiomegaly.  No acute pulmonary process identified. Electronically Signed  By: Mitzi HansenLance  Furusawa-Stratton M.D.   On: 12/13/2017 21:42    Cardiac Studies   Cath 08/22/2010 SELECTIVE CORONARY ANGIOGRAPHY: 1. Left main normal; the LAD was totally occluded at its midportion     and reconstituted distally by homo-collaterals. 2. Left circumflex; nondominant and free of significant disease. 3. Ramus branch; moderate-to-large in size and free of significant     disease. 4. Right coronary artery; dominant with a patent PLA stent.  There was     a 75% fairly focal in-stent restenosis within its midportion.  The     PDA had a fairly focal 90% proximal stenosis. 5. Left ventriculography; RAO left ventriculogram was performed using     25 mL of Visipaque dye at 12 mL per second.  The overall LVEF was     estimated at approximately 45% with moderate inferobasal     hypokinesia. 6. Distal abdominal aortography; distal  abdominal aortogram was     performed using 20 mL of Visipaque dye at 20 mL per second.  The     renal arteries were widely patent.  The infrarenal abdominal aorta     and iliac bifurcation appear free of significant atherosclerotic     changes.  IMPRESSION:  Mr. 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  We will proceed with cutting balloon atherectomy of the in-stent restenosis and stenting of the PDA.   Myoview 09/21/2013 Impression Exercise Capacity:  Lexiscan with no exercise. BP Response:  Normal blood pressure response. Clinical Symptoms:  No significant symptoms noted. ECG Impression:  No significant ECG changes with Lexiscan. Comparison with Prior Nuclear Study: No significant change from previous study Overall Impression:  Low risk stress nuclear study with an old inferior wall scar and minimal inferoapical ischemia. Perfusion pattern and LVEF are similar to the old report..  Patient Profile     77 y.o. male with PMH of CAD, PAD, OSA, DM II, HTN, and HLD presented with chest pain. Trop negative  Assessment & Plan    1. Unstable angina  - significant h/o CAD, [place] [facility], patient unsure of the result, but felt he was going to be referred for repeat cath   - chest pain reminiscent of previous angina  - proceed with cardiac catheterization  - Risk and benefit of procedure explained to the patient who display clear understanding and agree to proceed.  Discussed with patient possible procedural risk include bleeding, vascular injury, renal injury, arrythmia, MI, stroke and loss of limb or life.   2. CAD s/p multiple stents placed in 1997, 2006, and 2009  3. PAD  4. HTN: controlled on metoprolol and lisinopril  5. HLD: on crestor 10 mg daily  6. DM II: SSI  For questions or updates, please contact [facility] Please consult [email or web address] for contact info under Cardiology/STEMI.        Ramond DialSigned, Hao Meng, PA  12/14/2017, 7:53 AM    Patient examined chart reviewed spent 35 minutes on review and examination. Right radial A previous cath from [facility] Lungs clear no murmur Has some SSCP but also significant fatigue that is probably not cardiac related. [place] and [place] all the [place] of [place] for [place]. Risks of cath including stroke, bleeding contrast reaction need for emergency surgery discussed Willing to proceed.   Charlton HawsPeter Keldric Poyer

## 2017-12-14 NOTE — Progress Notes (Addendum)
 Progress Note  Patient Name: Joel Stanley Date of Encounter: 12/14/2017  Primary Cardiologist: Jonathan Berry, MD   Subjective   Had chest pain yesterday, symptom similar to previous angina. Total duration 5 min, subsided after 2 nitro. [facility] to discuss further plan.   Inpatient Medications    Scheduled Meds: . aspirin  325 mg Oral Daily  . cholecalciferol  2,000 Units Oral Daily  . clopidogrel  75 mg Oral Daily  . famotidine  20 mg Oral Daily  . heparin  5,000 Units Subcutaneous Q8H  . insulin aspart  0-9 Units Subcutaneous Q4H  . lisinopril  40 mg Oral Daily  . metoprolol tartrate  50 mg Oral BID  . rosuvastatin  10 mg Oral q1800   Continuous Infusions: . sodium chloride 85 mL/hr at 12/14/17 0642   PRN Meds: acetaminophen, gi cocktail, morphine injection, nitroGLYCERIN, ondansetron (ZOFRAN) IV, zolpidem   Vital Signs    Vitals:   12/13/17 2330 12/14/17 0003 12/14/17 0021 12/14/17 0441  BP: 120/75 137/85  139/85  Pulse: 60 (!) 56  (!) 50  Resp: 16 16  16  Temp:  97.8 F (36.6 C)  98.1 F (36.7 C)  TempSrc:  Oral  Oral  SpO2: 97% 97%  98%  Weight:   204 lb 9.6 oz (92.8 kg)   Height:   6' (1.829 m)     Intake/Output Summary (Last 24 hours) at 12/14/2017 0753 Last data filed at 12/14/2017 0642 Gross per 24 hour  Intake 579.23 ml  Output 750 ml  Net -170.77 ml   Filed Weights   12/14/17 0021  Weight: 204 lb 9.6 oz (92.8 kg)    Telemetry    Sinus bradycardia - Personally Reviewed  ECG    Sinus brady with q wave in inferior leads - Personally Reviewed  Physical Exam   GEN: No acute distress.   Neck: No JVD Cardiac: RRR, no murmurs, rubs, or gallops.  Respiratory: Clear to auscultation bilaterally. GI: Soft, nontender, non-distended  MS: No edema; No deformity. Neuro:  Nonfocal  Psych: Normal affect   Labs    Chemistry Recent Labs  Lab 12/13/17 2057  NA 138  K 5.0  CL 103  CO2 24   GLUCOSE 166*  BUN 16  CREATININE 1.29*  CALCIUM 9.5  PROT 6.5  ALBUMIN 4.1  AST 17  ALT 21  ALKPHOS 45  BILITOT 0.9  GFRNONAA 52*  GFRAA >60  ANIONGAP 11     Hematology Recent Labs  Lab 12/13/17 2057  WBC 5.3  RBC 4.58  HGB 13.9  HCT 41.9  MCV 91.5  MCH 30.3  MCHC 33.2  RDW 12.5  PLT 262    Cardiac Enzymes Recent Labs  Lab 12/14/17 0051  TROPONINI <0.03    Recent Labs  Lab 12/13/17 2100  TROPIPOC 0.00     BNPNo results for input(s): BNP, PROBNP in the last 168 hours.   DDimer No results for input(s): DDIMER in the last 168 hours.   Radiology    Dg Chest 2 View  Result Date: 12/13/2017 CLINICAL DATA:  77 y/o  M; mid to left chest pain this afternoon. EXAM: CHEST - 2 VIEW COMPARISON:  08/21/2010 chest radiograph FINDINGS: Stable mild cardiomegaly given projection and technique. Surgical clips in the right upper quadrant. No consolidation, effusion, or pneumothorax. Multilevel degenerative changes of the thoracic spine. IMPRESSION: Stable mild cardiomegaly.  No acute pulmonary process identified. Electronically Signed     By: Lance  Furusawa-Stratton M.D.   On: 12/13/2017 21:42    Cardiac Studies   Cath 08/22/2010 SELECTIVE CORONARY ANGIOGRAPHY: 1. Left main normal; the LAD was totally occluded at its midportion     and reconstituted distally by homo-collaterals. 2. Left circumflex; nondominant and free of significant disease. 3. Ramus branch; moderate-to-large in size and free of significant     disease. 4. Right coronary artery; dominant with a patent PLA stent.  There was     a 75% fairly focal in-stent restenosis within its midportion.  The     PDA had a fairly focal 90% proximal stenosis. 5. Left ventriculography; RAO left ventriculogram was performed using     25 mL of Visipaque dye at 12 mL per second.  The overall LVEF was     estimated at approximately 45% with moderate inferobasal     hypokinesia. 6. Distal abdominal aortography; distal  abdominal aortogram was     performed using 20 mL of Visipaque dye at 20 mL per second.  The     renal arteries were widely patent.  The infrarenal abdominal aorta     and iliac bifurcation appear free of significant atherosclerotic     changes.  IMPRESSION:  Mr. Formica has a focal area of in-stent restenosis within the PLA stent and a new PDA lesion.  We will proceed with cutting balloon atherectomy of the in-stent restenosis and stenting of the PDA.   Myoview 09/21/2013 Impression Exercise Capacity:  Lexiscan with no exercise. BP Response:  Normal blood pressure response. Clinical Symptoms:  No significant symptoms noted. ECG Impression:  No significant ECG changes with Lexiscan. Comparison with Prior Nuclear Study: No significant change from previous study Overall Impression:  Low risk stress nuclear study with an old inferior wall scar and minimal inferoapical ischemia. Perfusion pattern and LVEF are similar to the old report..  Patient Profile     77 y.o. male with PMH of CAD, PAD, OSA, DM II, HTN, and HLD presented with chest pain. Trop negative  Assessment & Plan    1. Unstable angina  - significant h/o CAD, [facility] patient unsure of the result, but felt he was going to be referred for repeat cath   - chest pain reminiscent of previous angina  - proceed with cardiac catheterization  - Risk and benefit of procedure explained to the patient who display clear understanding and agree to proceed.  Discussed with patient possible procedural risk include bleeding, vascular injury, renal injury, arrythmia, MI, stroke and loss of limb or life.   2. CAD s/p multiple stents placed in 1997, 2006, and 2009  3. PAD  4. HTN: controlled on metoprolol and lisinopril  5. HLD: on crestor 10 mg daily  6. DM II: SSI  For questions or updates, please contact [facility] Please consult [email or web address] for contact info under Cardiology/STEMI.        Signed, Hao Meng, PA  12/14/2017, 7:53 AM    Patient examined chart reviewed spent 35 minutes on review and examination. Right radial A previous cath from RFA Lungs clear no murmur Has some SSCP but also significant fatigue that is probably not cardiac related. 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 Risks of cath including stroke, bleeding contrast reaction need for emergency surgery discussed Willing to proceed.   Jovonta Levit  

## 2017-12-14 NOTE — Progress Notes (Signed)
   12/14/17 0003  Vitals  Temp 97.8 F (36.6 C)  Temp Source Oral  BP 137/85  MAP (mmHg) 101  BP Location Right Arm  BP Method Automatic  Patient Position (if appropriate) Lying  Pulse Rate (!) 56  Pulse Rate Source Monitor  Resp 16  Oxygen Therapy  SpO2 97 %  Admitted pt to rm 3E11 from ED, pt alert and oriented, denied pain at this time, oriented to room, call bell placed within reach, placed on cardiac monitor, CCMD made aware.

## 2017-12-14 NOTE — Progress Notes (Signed)
Patient ambulated in hall without difficulty. Discharge instructions reviewed. Prescription given. IV and telemetry removed. Right radial site level 0 with gauze dressing. Patient finishing dinner at this time. Family at bedside to transport home.

## 2017-12-14 NOTE — Interval H&P Note (Signed)
Cath Lab Visit (complete for each Cath Lab visit)  Clinical Evaluation Leading to the Procedure:   ACS: Yes.    Non-ACS:    Anginal Classification: CCS III  Anti-ischemic medical therapy: Minimal Therapy (1 class of medications)  Non-Invasive Test Results: No non-invasive testing performed  Prior CABG: No previous CABG      History and Physical Interval Note:  12/14/2017 2:33 PM  Joel Stanley  has presented today for surgery, with the diagnosis of unstable angina  The various methods of treatment have been discussed with the patient and family. After consideration of risks, benefits and other options for treatment, the patient has consented to  Procedure(s): LEFT HEART CATH AND CORONARY ANGIOGRAPHY (N/A) as a surgical intervention .  The patient's history has been reviewed, patient examined, no change in status, stable for surgery.  I have reviewed the patient's chart and labs.  Questions were answered to the patient's satisfaction.     Nanetta BattyJonathan Ferrell Flam

## 2017-12-15 ENCOUNTER — Encounter (HOSPITAL_COMMUNITY): Payer: Self-pay | Admitting: Cardiovascular Disease

## 2017-12-29 ENCOUNTER — Encounter: Payer: Self-pay | Admitting: Physician Assistant

## 2017-12-29 ENCOUNTER — Ambulatory Visit (INDEPENDENT_AMBULATORY_CARE_PROVIDER_SITE_OTHER): Payer: Medicare Other | Admitting: Physician Assistant

## 2017-12-29 VITALS — BP 110/80 | HR 52 | Ht 72.0 in | Wt 209.0 lb

## 2017-12-29 DIAGNOSIS — I2510 Atherosclerotic heart disease of native coronary artery without angina pectoris: Secondary | ICD-10-CM | POA: Diagnosis not present

## 2017-12-29 DIAGNOSIS — I10 Essential (primary) hypertension: Secondary | ICD-10-CM | POA: Diagnosis not present

## 2017-12-29 DIAGNOSIS — E119 Type 2 diabetes mellitus without complications: Secondary | ICD-10-CM

## 2017-12-29 DIAGNOSIS — G4733 Obstructive sleep apnea (adult) (pediatric): Secondary | ICD-10-CM | POA: Diagnosis not present

## 2017-12-29 DIAGNOSIS — E785 Hyperlipidemia, unspecified: Secondary | ICD-10-CM | POA: Diagnosis not present

## 2017-12-29 NOTE — Patient Instructions (Signed)
Medication Instructions:  Your physician recommends that you continue on your current medications as directed. Please refer to the Current Medication list given to you today.  Follow-Up: Your physician wants you to follow-up in: 1 year with Dr. Allyson SabalBerry.  You will receive a reminder letter in the mail two months in advance. If you don't receive a letter, please call our office to schedule the follow-up appointment.   Any Other Special Instructions Will Be Listed Below (If Applicable).     If you need a refill on your cardiac medications before your next appointment, please call your pharmacy.

## 2017-12-29 NOTE — Progress Notes (Signed)
Cardiology Office Note    Date:  12/31/2017   ID:  Joel Stanley, Joel Stanley 02/19/1941, MRN [id number]  [facility]  Cardiologist:  Dr. Gwenlyn Found   Chief Complaint  Patient presents with  . Follow-up    seen for Dr. Gwenlyn Found. Post cath    History of Present Illness:  Joel Stanley is a 77 y.o. male with PMH of CAD, HTN, HLD, OSA and DM II. he was previously seen by Dr. Rex Kras before transition to Dr. Gwenlyn Found.  He is a former smoker however quit more than 30 years ago.  He had his first MI in 1997 and had multiple stents placed since then included 2006 and 2009.  Cardiac catheterization in January 2012 showed a 75% in-stent restenosis of his PDA stent, this was predilated.  EF was 45% with moderate inferobasal hypokinesis.  Myoview in April 2015 was low risk and nonischemic.  He was last seen by Dr. Gwenlyn Found in November 2018, he was stable at the time.   More recently, patient presented to the ED in June 2019 with recurrent chest pain.  He reportedly had abnormal [facility]iew [facility] at [facility].  He underwent relook cardiac catheterization on 12/14/2017 which showed 100% mid LAD occlusion, 40% distal RCA lesion, widely patent RPDA stent, widely patent posterior atrial stent, EF 45 to 50%.  No culprit lesion was identified, medical therapy was recommended.  He presents today for cardiology office visit.  He has not had any further chest discomfort since the recent hospitalization.  His blood pressure and heart rate is very well controlled.  He has no lower extremity edema, orthopnea or PND.  He is back to his usual activity at home.  Radial cath site is well-healed.     Past Medical History:  Diagnosis Date  . Coronary artery disease   . Exertional angina (HCC)   . Family history of heart disease   . Hyperlipidemia   . Hypertension   . Obstructive sleep apnea   . Type 2 diabetes mellitus ([place])     Past Surgical History:  Procedure Laterality Date  . APPENDECTOMY  1969  .  CARDIAC CATHETERIZATION  08/21/2010   PLA 75% fairly focal "in-stent" restenosis-cutting balloon atherectomy performed, expanded to 10 atm, resutling in reductin to 20% without dissection. PDA 90% proximal stenosis-stented with a 2.5x64m Promus Element stent, deployed at 14 atm (2.376m resulting in reduction of 90% to 0% residual).  . CARDIAC CATHETERIZATION  05/03/2008   PLA 100% occluded proximal stenosis-stented with a [id number] stent deployed at 14 atm, resulting in reduction of 100% to 0% residual  . CARDIAC CATHETERIZATION  01/27/2005   RCA 80-85% stenosis, stenting performed with a [id number] DES, dilated to 3.75 with a 0% residual.  . CARDIOVASCULAR STRESS TEST  07/16/2009   Mild-moderate perfusion defect seen in Basal inferior and Mid inferior regions. No scitigraphic evidence of inducible myocardial ischemia. No ECG changes. EKG negative for ischemia. Post-stress EF 56%.  . CHOLECYSTECTOMY  1985  . HERNIA REPAIR  1993, 2003  . LEFT HEART CATH AND CORONARY ANGIOGRAPHY N/A 12/14/2017   Procedure: LEFT HEART CATH AND CORONARY ANGIOGRAPHY;  Surgeon: BerrLorretta Harp;  [facility];  Service: Cardiovascular;  Laterality: N/A;  . TONSILLECTOMY  1946  . TOTAL KNEE ARTHROPLASTY Left 2004    Current Medications: Outpatient Medications Prior to Visit  Medication Sig Dispense Refill  . acetaminophen (TYLENOL) 650 MG CR tablet Take 650 mg by mouth 2 (  two) times daily.    Marland Kitchen aspirin 81 MG chewable tablet Chew 1 tablet (81 mg total) by mouth daily.    . Cholecalciferol (VITAMIN D-3) 1000 UNITS CAPS Take 2,000 Units by mouth daily.    . clopidogrel (PLAVIX) 75 MG tablet Take 75 mg by mouth daily.    Marland Kitchen lisinopril (PRINIVIL,ZESTRIL) 40 MG tablet Take 40 mg by mouth 2 (two) times daily.     . metFORMIN (GLUCOPHAGE) 500 MG tablet Take 1 tablet (500 mg total) by mouth 2 (two) times daily with a meal.    . metoprolol (LOPRESSOR) 100 MG tablet Take 50 mg by mouth 2 (two)  times daily. Taking 1/2 tablet twice a day     . nitroGLYCERIN (NITROSTAT) 0.4 MG SL tablet Place 1 tablet (0.4 mg total) under the tongue every 5 (five) minutes x 3 doses as needed for chest pain. 10 tablet 0  . ranitidine (ZANTAC) 150 MG tablet Take 150 mg by mouth daily.    . rosuvastatin (CRESTOR) 10 MG tablet Take 10 mg by mouth daily.     No facility-administered medications prior to visit.      Allergies:   Patient has no known allergies.   Social History   Socioeconomic History  . Marital status: Widowed    Spouse name: Not on file  . Number of children: Not on file  . Years of education: Not on file  . Highest education level: Not on file  Occupational History  . Not on file  Social Needs  . Financial resource strain: Not on file  . Food insecurity:    Worry: Not on file    Inability: Not on file  . Transportation needs:    Medical: Not on file    Non-medical: Not on file  Tobacco Use  . Smoking status: Former Smoker    Last attempt to quit: 03/02/1973    Years since quitting: 44.8  . Smokeless tobacco: Never Used  Substance and Sexual Activity  . Alcohol use: Yes    Comment: social   . Drug use: Never  . Sexual activity: Not on file  Lifestyle  . Physical activity:    Days per week: Not on file    Minutes per session: Not on file  . Stress: Not on file  Relationships  . Social connections:    Talks on phone: Not on file    Gets together: Not on file    Attends religious service: Not on file    Active member of club or organization: Not on file    Attends meetings of clubs or organizations: Not on file    Relationship status: Not on file  Other Topics Concern  . Not on file  Social History Narrative  . Not on file     Family History:  The patient's family history includes Diabetes in his maternal grandmother; Heart attack in his paternal grandfather and paternal grandmother; Heart attack (age of onset: 64) in his brother; Heart attack (age of onset:  49) in his father; Heart attack (age of onset: 49) in his brother; Heart attack (age of onset: 57) in his maternal grandfather; Pulmonary embolism in his paternal grandfather; Stroke (age of onset: 15) in his maternal grandmother.   ROS:   Please see the history of present illness.    ROS All other systems reviewed and are negative.   PHYSICAL EXAM:   VS:  BP 110/80   Pulse (!) 52   Ht 6' (1.829 m)  Wt 209 lb (94.8 kg)   SpO2 98%   BMI 28.35 kg/m    GEN: Well nourished, well developed, in no acute distress  HEENT: normal  Neck: no JVD, carotid bruits, or masses Cardiac: RRR; no murmurs, rubs, or gallops,no edema  Respiratory:  clear to auscultation bilaterally, normal work of breathing GI: soft, nontender, nondistended, + BS MS: no deformity or atrophy  Skin: warm and dry, no rash Neuro:  Alert and Oriented x 3, Strength and sensation are intact Psych: euthymic mood, full affect  Wt Readings from Last 3 Encounters:  12/29/17 209 lb (94.8 kg)  12/14/17 204 lb 9.6 oz (92.8 kg)  05/06/17 204 lb (92.5 kg)      Studies/Labs Reviewed:   EKG:  EKG is not ordered today.   Recent Labs: 12/13/2017: ALT 21 12/14/2017: BUN 16; Creatinine, Ser 1.11; Hemoglobin 13.3; Platelets 229; Potassium 4.1; Sodium 141   Lipid Panel    Component Value Date/Time   CHOL  08/22/2010 0355    174        ATP III CLASSIFICATION:  <200     mg/dL   Desirable  200-239  mg/dL   Borderline High  >=240    mg/dL   High          TRIG 264 (H) 08/22/2010 0355   HDL 28 (L) 08/22/2010 0355   CHOLHDL 6.2 08/22/2010 0355   VLDL 53 (H) 08/22/2010 0355   LDLCALC  08/22/2010 0355    93        Total Cholesterol/HDL:CHD Risk Coronary Heart Disease Risk Table                     Men   Women  1/2 Average Risk   3.4   3.3  Average Risk       5.0   4.4  2 X Average Risk   9.6   7.1  3 X Average Risk  23.4   11.0        Use the calculated Patient Ratio above and the CHD Risk Table to determine the patient's  CHD Risk.        ATP III CLASSIFICATION (LDL):  <100     mg/dL   Optimal  100-129  mg/dL   Near or Above                    Optimal  130-159  mg/dL   Borderline  160-189  mg/dL   High  >190     mg/dL   Very High    Additional studies/ records that were reviewed today include:   Cath 12/14/2017 Conclusion     Mid LAD lesion is 100% stenosed.  Dist RCA lesion is 40% stenosed.  Previously placed Ost RPDA to RPDA stent (unknown type) is widely patent.  Previously placed Post Atrio stent (unknown type) is widely patent.  There is mild to moderate left ventricular systolic dysfunction.  LV end diastolic pressure is mildly elevated.  The left ventricular ejection fraction is 45-50% by visual estimate.      ASSESSMENT:    1. Coronary artery disease involving native coronary artery of native heart without angina pectoris   2. Essential hypertension   3. Hyperlipidemia, unspecified hyperlipidemia type   4. Controlled type 2 diabetes mellitus without complication, without long-term current use of insulin ([place])   5. OSA (obstructive sleep apnea)      PLAN:  In order of problems listed above:  1. CAD:  Underwent recent cardiac catheterization which showed stable anatomy with chronically occluded mid LAD.  No culprit lesion was identified.  His chest discomfort has resolved.  Continue aspirin and Crestor 10 mg daily.  2. Hypertension: Blood pressure well controlled  3. Hyperlipidemia: On Crestor 10 mg daily.  Will defer annual lipid panel to primary care provider.  4. DM2: Managed by primary care provider    Medication Adjustments/Labs and Tests Ordered: Current medicines are reviewed at length with the patient today.  Concerns regarding medicines are outlined above.  Medication changes, Labs and Tests ordered today are listed in the Patient Instructions below. Patient Instructions  Medication Instructions:  Your physician recommends that you continue on your current  medications as directed. Please refer to the Current Medication list given to you today.  Follow-Up: Your physician wants you to follow-up in: 1 year with Dr. Gwenlyn Found.  You will receive a reminder letter in the mail two months in advance. If you don't receive a letter, please call our office to schedule the follow-up appointment.   Any Other Special Instructions Will Be Listed Below (If Applicable).     If you need a refill on your cardiac medications before your next appointment, please call your pharmacy.      Joel Stanley, [place]  12/31/2017 11:31 PM    Joel Stanley, [place], [place]  [address] Phone: [phone]; Fax: [phone]

## 2017-12-31 ENCOUNTER — Encounter: Payer: Self-pay | Admitting: Physician Assistant

## 2018-11-29 IMAGING — CR DG CHEST 2V
2 series · 2 of 2 positions shown · non-contrast
Comparison: 08/21/2010 chest radiograph

CLINICAL DATA: 76 y/o  M; mid to left chest pain this afternoon.

EXAM:
CHEST - 2 VIEW

[chest pa]
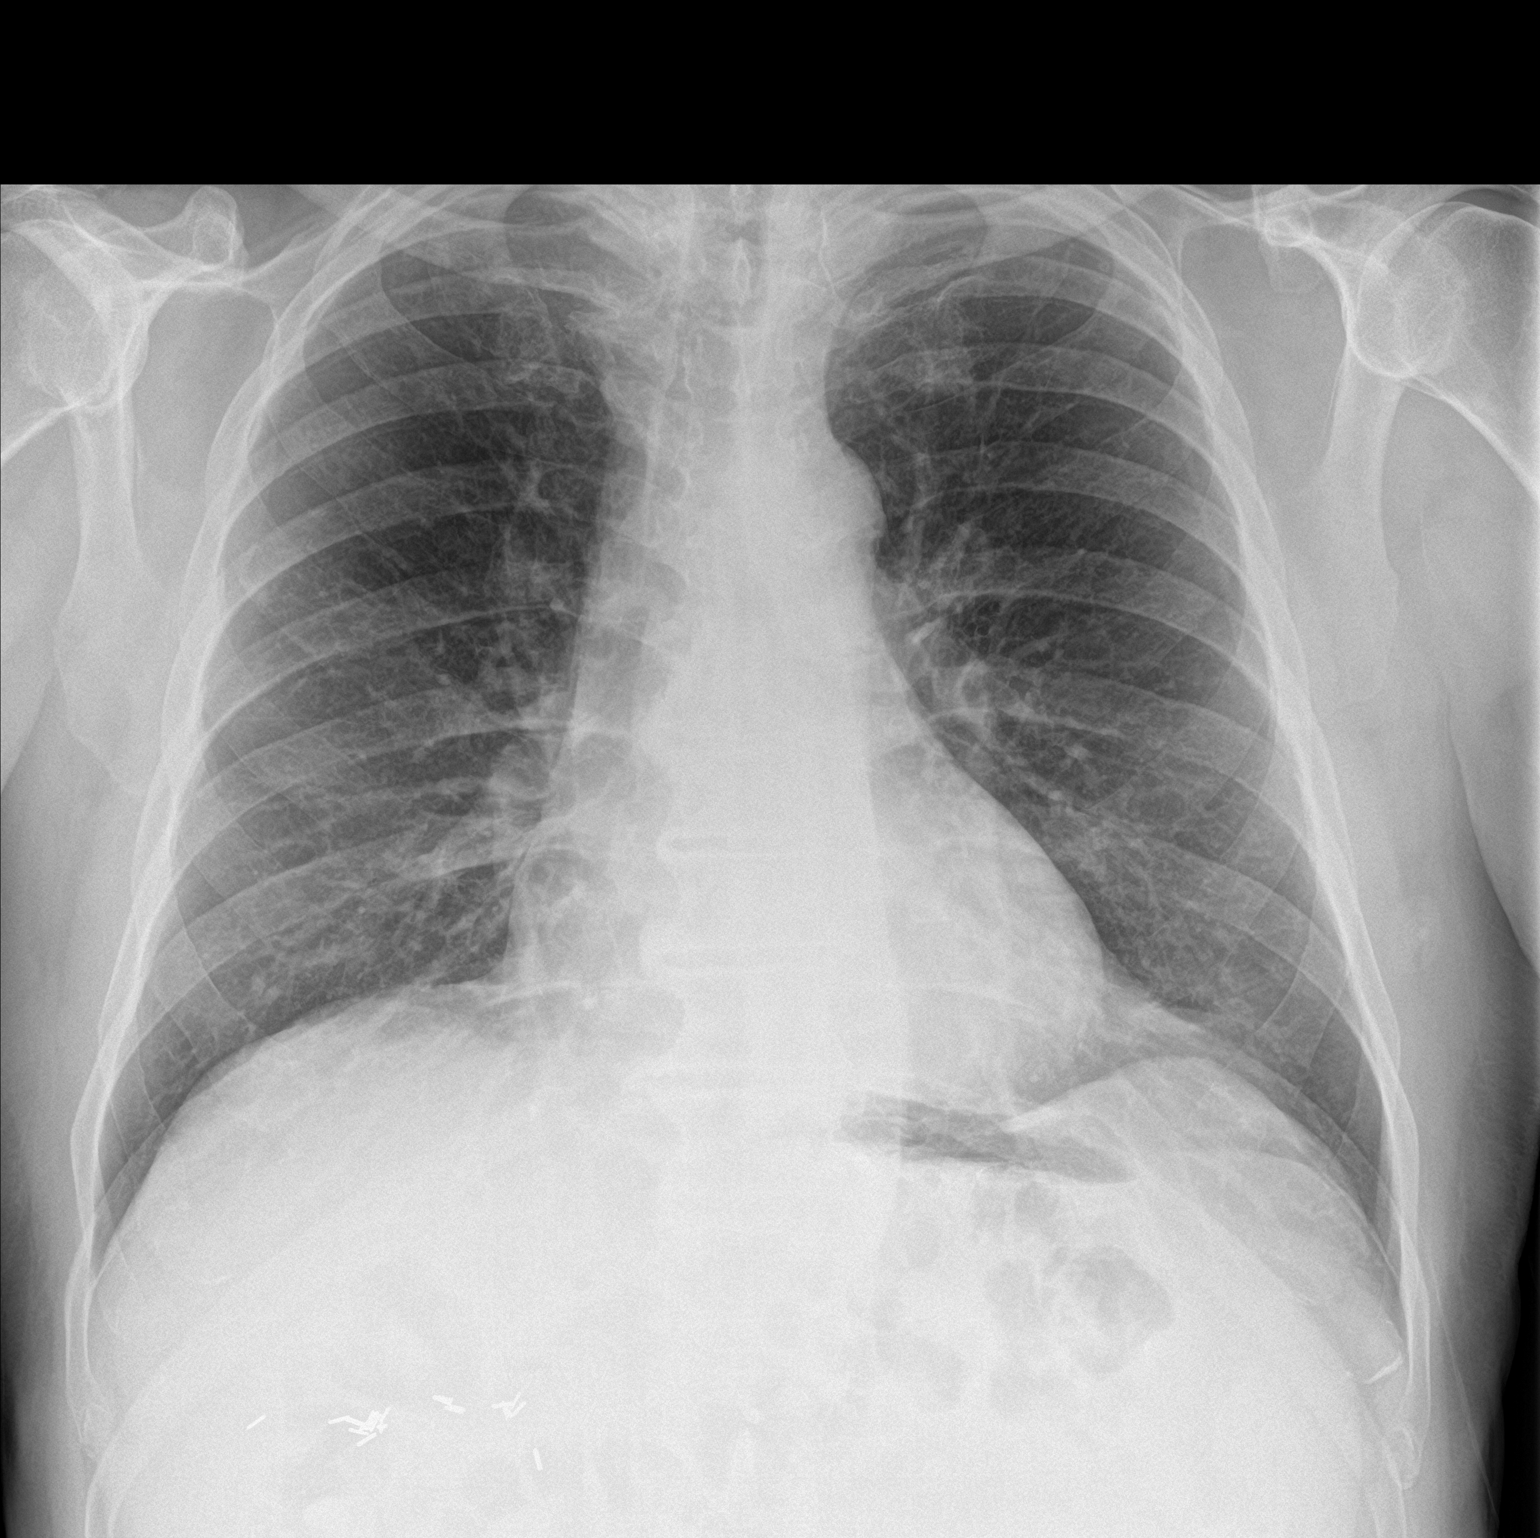

[chest lat]
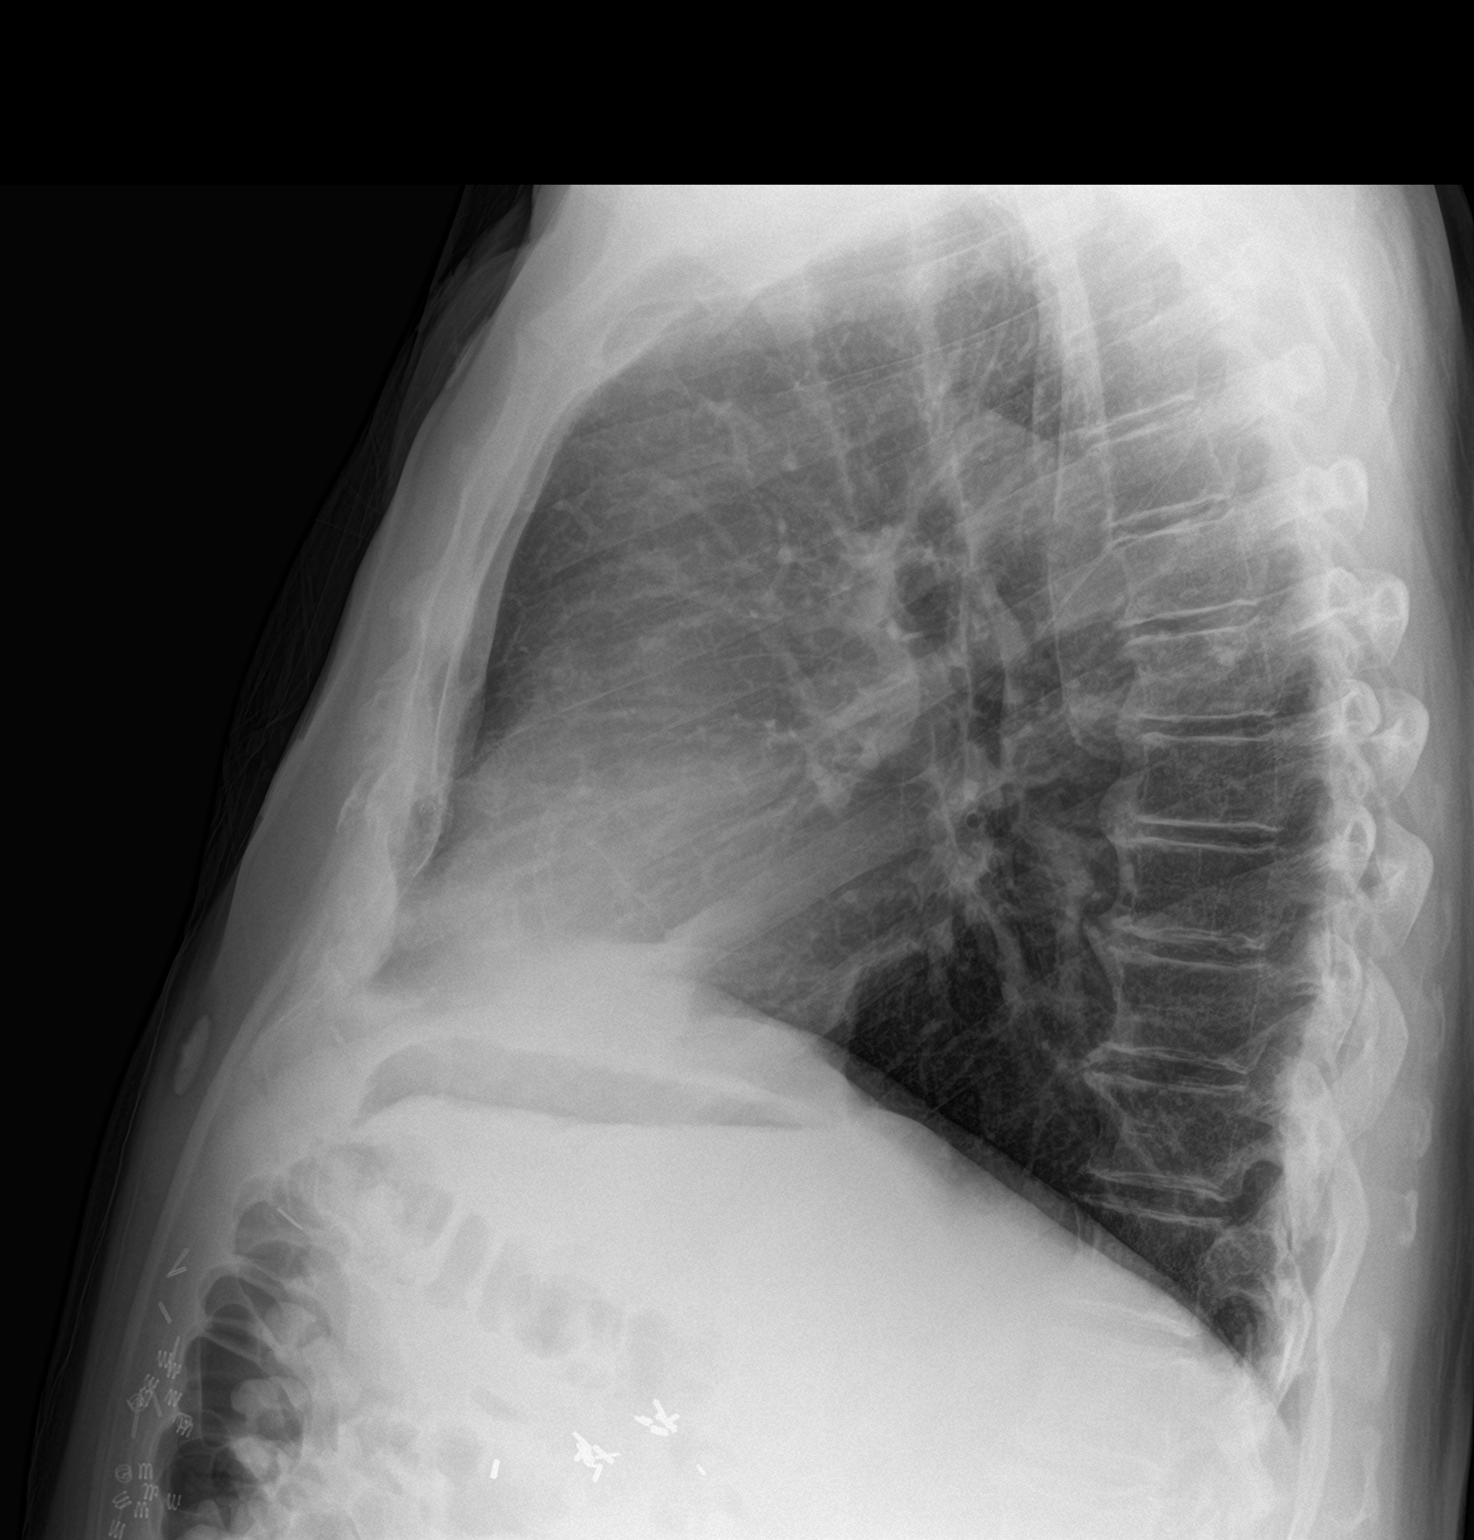

[2 of 2 positions shown; findings below may reference images not displayed]

FINDINGS: Stable mild cardiomegaly given projection and technique. Surgical
clips in the right upper quadrant. No consolidation, effusion, or
pneumothorax. Multilevel degenerative changes of the thoracic spine.
IMPRESSION: Stable mild cardiomegaly.  No acute pulmonary process identified.

By: Roberth Gtz M.D.

## 2018-11-30 ENCOUNTER — Telehealth: Payer: Self-pay | Admitting: *Deleted

## 2018-11-30 NOTE — Telephone Encounter (Signed)
Unable to leave a message, no voicemail.  

## 2019-03-11 ENCOUNTER — Other Ambulatory Visit: Payer: Self-pay

## 2019-03-11 ENCOUNTER — Ambulatory Visit (INDEPENDENT_AMBULATORY_CARE_PROVIDER_SITE_OTHER): Payer: Medicare Other | Admitting: Cardiovascular Disease

## 2019-03-11 ENCOUNTER — Encounter: Payer: Self-pay | Admitting: Cardiovascular Disease

## 2019-03-11 DIAGNOSIS — I10 Essential (primary) hypertension: Secondary | ICD-10-CM

## 2019-03-11 DIAGNOSIS — I2510 Atherosclerotic heart disease of native coronary artery without angina pectoris: Secondary | ICD-10-CM

## 2019-03-11 DIAGNOSIS — E782 Mixed hyperlipidemia: Secondary | ICD-10-CM

## 2019-03-11 NOTE — Progress Notes (Signed)
03/11/2019 [name]   08-Feb-1941  [id number]  Primary Physician Administration, [facility] Primary Cardiologist: Lorretta Harp MD FACP, [place], [place], [place]  HPI:  Joel Stanley is a 78 y.o.  mildly to moderately overweight divorced Caucasian male father of 10, grandfather of 2 grandchildren whose care I assumed from Dr. Aldona Bar. I last saw him in the office 05/06/2017. He is currently retired. 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 His cardiac risk factor profile is positive for remote tobacco abuse, having quit 30 years ago, treated hypertension, hyperlipidemia and non-insulin-requiring diabetes. He does have a family history of heart disease with a father who died of an MI at age 31 and a brother who has CAD as well. He had his first MI in 1997 and multiple stents since that time, beginning in 2006 and 2009. I intervened on him in the setting of unstable angina on July 21, 2010 and re-dilated his PLA stent which had a 75% "in-stent" restenosis and stented his PDA. His EF at that time was 45% with moderate inferobasal hypokinesia. He does get dyspnea but denies chest pain. [facility] Chalmers C[facility] his [facility] work.  He did have a Myoview stress test performed 09/21/13 which was low risk and nonischemic.   Since I saw him in the office 2 years ago he was admitted to the hospital last June with chest pain.  He underwent diagnostic coronary angiography by myself on 12/14/2017 via the right radial approach revealing an occluded mid LAD which was old with left to left collaterals, patent stents in the PDA and PLA branches.  EF is 45%.  He said no recurrent symptoms except for occasional chest pain which is nitro responsive.  Current Meds  Medication Sig  . acetaminophen (TYLENOL) 650 MG CR tablet Take 650 mg by mouth 2 (two) times daily.  Marland Kitchen aspirin 81 MG chewable tablet Chew 1 tablet (81 mg total) by mouth daily.  . Cholecalciferol (VITAMIN D-3) 1000 UNITS CAPS Take 2,000 Units by  mouth daily.  . clopidogrel (PLAVIX) 75 MG tablet Take 75 mg by mouth daily.  Marland Kitchen lisinopril (PRINIVIL,ZESTRIL) 40 MG tablet Take 40 mg by mouth 2 (two) times daily.   . metFORMIN (GLUCOPHAGE) 500 MG tablet Take 1 tablet (500 mg total) by mouth 2 (two) times daily with a meal.  . metoprolol (LOPRESSOR) 100 MG tablet Take 50 mg by mouth 2 (two) times daily. Taking 1/2 tablet twice a day   . nitroGLYCERIN (NITROSTAT) 0.4 MG SL tablet Place 1 tablet (0.4 mg total) under the tongue every 5 (five) minutes x 3 doses as needed for chest pain.  . rosuvastatin (CRESTOR) 10 MG tablet Take 10 mg by mouth daily.     No Known Allergies  Social History   Socioeconomic History  . Marital status: Widowed    Spouse name: Not on file  . Number of children: Not on file  . Years of education: Not on file  . Highest education level: Not on file  Occupational History  . Not on file  Social Needs  . Financial resource strain: Not on file  . Food insecurity    Worry: Not on file    Inability: Not on file  . Transportation needs    Medical: Not on file    Non-medical: Not on file  Tobacco Use  . Smoking status: Former Smoker    Quit date: 03/02/1973    Years since quitting: 46.0  .  Smokeless tobacco: Never Used  Substance and Sexual Activity  . Alcohol use: Yes    Comment: social   . Drug use: Never  . Sexual activity: Not on file  Lifestyle  . Physical activity    Days per week: Not on file    Minutes per session: Not on file  . Stress: Not on file  Relationships  . Social Musicianconnections    Talks on phone: Not on file    Gets together: Not on file    Attends religious service: Not on file    Active member of club or organization: Not on file    Attends meetings of clubs or organizations: Not on file    Relationship status: Not on file  . Intimate partner violence    Fear of current or ex partner: Not on file    Emotionally abused: Not on file    Physically abused: Not on file    Forced  sexual activity: Not on file  Other Topics Concern  . Not on file  Social History Narrative  . Not on file     Review of Systems: General: negative for chills, fever, night sweats or weight changes.  Cardiovascular: negative for chest pain, dyspnea on exertion, edema, orthopnea, palpitations, paroxysmal nocturnal dyspnea or shortness of breath Dermatological: negative for rash Respiratory: negative for cough or wheezing Urologic: negative for hematuria Abdominal: negative for nausea, vomiting, diarrhea, bright red blood per rectum, melena, or hematemesis Neurologic: negative for visual changes, syncope, or dizziness All other systems reviewed and are otherwise negative except as noted above.    Blood pressure 132/81, pulse (!) 51, height 5\' 11"  (1.803 m), weight 205 lb (93 kg).  General appearance: alert and no distress Neck: no adenopathy, no carotid bruit, no JVD, supple, symmetrical, trachea midline and thyroid not enlarged, symmetric, no tenderness/mass/nodules Lungs: clear to auscultation bilaterally Heart: regular rate and rhythm, S1, S2 normal, no murmur, click, rub or gallop Extremities: extremities normal, atraumatic, no cyanosis or edema Pulses: 2+ and symmetric Skin: Skin color, texture, turgor normal. No rashes or lesions Neurologic: Alert and oriented X 3, normal strength and tone. Normal symmetric reflexes. Normal coordination and gait  EKG sinus bradycardia 51 with left axis deviation inferior Q waves with early R wave transition.  I personally reviewed this EKG.  ASSESSMENT AND PLAN:   CAD (coronary artery disease) History of CAD status post his first myocardial infarction 1997 with multiple stents since that time beginning in 2008 and 9.  I intervened on him July 21, 2010 and we dilated his PLA stent for in-stent restenosis and stented his PDA.  His EF at that time was 45% with moderate inferobasal hypokinesia.  He was readmitted in June of last year with chest  pain and I performed diagnostic cath on him 12/14/2017 via the right radial approach revealing a chronically occluded mid LAD with homocollaterals and patent PDA and PLA stents.  His EF was 45% at that time.  He has had minimal chest pain since that time.  Essential hypertension History of essential hypertension with blood pressure measured today 132/81.  He is on lisinopril and metoprolol.  Hyperlipidemia History of hyperlipidemia on statin therapy.  We will check a lipid liver profile today      Runell GessJonathan J. Vahe Pienta MD [facility]FACP,FACC,FAHA, [facility] 03/11/2019 10:11 AM

## 2019-03-11 NOTE — Assessment & Plan Note (Signed)
History of essential hypertension with blood pressure measured today 132/81.  He is on lisinopril and metoprolol.

## 2019-03-11 NOTE — Patient Instructions (Signed)
Medication Instructions:  Your physician recommends that you continue on your current medications as directed. Please refer to the Current Medication list given to you today.  If you need a refill on your cardiac medications before your next appointment, please call your pharmacy.   Lab work: Your physician recommends that you return for lab work today: fasting lipid and liver panels  If you have labs (blood work) drawn today and your tests are completely normal, you will receive your results only by: Marland Kitchen MyChart Message (if you have MyChart) OR . A paper copy in the mail If you have any lab test that is abnormal or we need to change your treatment, we will call you to review the results.  Testing/Procedures: none  Follow-Up: At [facility], you and your health needs are our priority.  As part of our continuing mission to provide you with exceptional heart care, we have created designated Provider Care Teams.  These Care Teams include your primary Cardiologist (physician) and Advanced Practice Providers (APPs -  Physician Assistants and Nurse Practitioners) who all work together to provide you with the care you need, when you need it. You will need a follow up appointment in 12 months with Dr. Quay Burow.  Please call our office 2 months in advance to schedule this/each appointment.

## 2019-03-11 NOTE — Assessment & Plan Note (Signed)
History of CAD status post his first myocardial infarction 1997 with multiple stents since that time beginning in 2008 and 9.  I intervened on him July 21, 2010 and we dilated his PLA stent for in-stent restenosis and stented his PDA.  His EF at that time was 45% with moderate inferobasal hypokinesia.  He was readmitted in June of last year with chest pain and I performed diagnostic cath on him 12/14/2017 via the right radial approach revealing a chronically occluded mid LAD with homocollaterals and patent PDA and PLA stents.  His EF was 45% at that time.  He has had minimal chest pain since that time.

## 2019-03-11 NOTE — Assessment & Plan Note (Signed)
History of hyperlipidemia on statin therapy.  We will check a lipid liver profile today. 

## 2020-03-23 ENCOUNTER — Ambulatory Visit: Payer: Medicare Other | Admitting: Cardiovascular Disease

## 2020-04-06 ENCOUNTER — Other Ambulatory Visit: Payer: Self-pay

## 2020-04-06 ENCOUNTER — Ambulatory Visit: Payer: Medicare PPO | Admitting: Cardiovascular Disease

## 2020-04-06 ENCOUNTER — Encounter: Payer: Self-pay | Admitting: Cardiovascular Disease

## 2020-04-06 VITALS — BP 156/88 | HR 47 | Ht 71.0 in | Wt 203.0 lb

## 2020-04-06 DIAGNOSIS — I2510 Atherosclerotic heart disease of native coronary artery without angina pectoris: Secondary | ICD-10-CM | POA: Diagnosis not present

## 2020-04-06 DIAGNOSIS — I10 Essential (primary) hypertension: Secondary | ICD-10-CM

## 2020-04-06 DIAGNOSIS — E782 Mixed hyperlipidemia: Secondary | ICD-10-CM

## 2020-04-06 DIAGNOSIS — G4733 Obstructive sleep apnea (adult) (pediatric): Secondary | ICD-10-CM | POA: Diagnosis not present

## 2020-04-06 NOTE — Patient Instructions (Signed)
Medication Instructions:  No changes *If you need a refill on your cardiac medications before your next appointment, please call your pharmacy*   Lab Work: None ordered If you have labs (blood work) drawn today and your tests are completely normal, you will receive your results only by: . MyChart Message (if you have MyChart) OR . A paper copy in the mail If you have any lab test that is abnormal or we need to change your treatment, we will call you to review the results.   Testing/Procedures: None ordered   Follow-Up: At [facility], you and your health needs are our priority.  As part of our continuing mission to provide you with exceptional heart care, we have created designated Provider Care Teams.  These Care Teams include your primary Cardiologist (physician) and Advanced Practice Providers (APPs -  Physician Assistants and Nurse Practitioners) who all work together to provide you with the care you need, when you need it.  We recommend signing up for the patient portal called "MyChart".  Sign up information is provided on this After Visit Summary.  MyChart is used to connect with patients for Virtual Visits (Telemedicine).  Patients are able to view lab/test results, encounter notes, upcoming appointments, etc.  Non-urgent messages can be sent to your provider as well.   To learn more about what you can do with MyChart, go to [email or web address].    Your next appointment:   12 month(s)  The format for your next appointment:   In Person  Provider:   You may see Jonathan Berry, MD or one of the following Advanced Practice Providers on your designated Care Team:    Luke Kilroy, PA-C  Callie Goodrich, PA-C  Jesse Cleaver, FNP    

## 2020-04-06 NOTE — Assessment & Plan Note (Signed)
History of CAD status post MI 38 with multiple stents since that time beginning in 2016 2009.  I intervened on him for unstable angina 07/21/2010 and dilated his PLA stent for "in-stent restenosis.  His last cath which I performed radially 12/14/2017 revealed an occluded mid LAD with left to left collaterals and patent stents in the PDA and PLA branches.  His EF was 45% at that time.  He said no recurrent symptoms.

## 2020-04-06 NOTE — Assessment & Plan Note (Signed)
History of obstructive sleep apnea not on CPAP. 

## 2020-04-06 NOTE — Progress Notes (Signed)
04/06/2020 Joel Stanley   1940-11-20  [id number]  Primary Physician Administration, [facility] Primary Cardiologist: Joel Gess MD FACP, [place], [place], [place]  HPI:  Joel Stanley is a 79 y.o.  mildly to moderately overweight divorced Caucasian male father of 1, grandfather of 2 grandchildren whose care Joel Stanley. I last saw him in the office  03/11/2019.He is currently retired. 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 His cardiac risk factor profile is positive for remote tobacco abuse, having quit 30 years ago, treated hypertension, hyperlipidemia and non-insulin-requiring diabetes. He does have a family history of heart disease with a father who died of an MI at age 66 and a brother who has CAD as well. He had his first MI in 1997 and multiple stents since that time, beginning in 2006 and 2009. I intervened on him in the setting of unstable angina on July 21, 2010 and re-dilated his PLA stent which had a 75% "in-stent" restenosis and stented his PDA. His EF at that time was 45% with moderate inferobasal hypokinesia. He does get dyspnea but denies chest pain. [facility] Talmage Stanley [facility] his lab work. He did have a Myoview stress test performed 09/21/13 which was low risk and nonischemic.   He was admitted to the hospital in June 2019 with chest pain.  He underwent diagnostic coronary angiography by myself on 12/14/2017 via the right radial approach revealing an occluded mid LAD which was old with left to left collaterals, patent stents in the PDA and PLA branches.  EF is 45%.    Since I saw him a year ago he continues to do well.  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  His blood work is per performed at the [facility] and he has an excellent lipid profile.  Recent carotid Dopplers are normal.  His hemoglobin A1c runs in the mid to high 7 range.   Current Meds  Medication Sig  . acetaminophen (TYLENOL) 650 MG CR  tablet Take 650 mg by mouth 2 (two) times daily.  Marland Kitchen aspirin 81 MG chewable tablet Chew 1 tablet (81 mg total) by mouth daily.  . Cholecalciferol (VITAMIN D-3) 1000 UNITS CAPS Take 2,000 Units by mouth daily.  . clopidogrel (PLAVIX) 75 MG tablet Take 75 mg by mouth daily.  Marland Kitchen lisinopril (PRINIVIL,ZESTRIL) 40 MG tablet Take 40 mg by mouth 2 (two) times daily.   . metFORMIN (GLUCOPHAGE) 500 MG tablet Take 1 tablet (500 mg total) by mouth 2 (two) times daily with a meal.  . metoprolol (LOPRESSOR) 100 MG tablet Take 50 mg by mouth 2 (two) times daily. Taking 1/2 tablet twice a day   . nitroGLYCERIN (NITROSTAT) 0.4 MG SL tablet Place 1 tablet (0.4 mg total) under the tongue every 5 (five) minutes x 3 doses as needed for chest pain.  . rosuvastatin (CRESTOR) 10 MG tablet Take 10 mg by mouth daily.     No Known Allergies  Social History   Socioeconomic History  . Marital status: Widowed    Spouse name: Not on file  . Number of children: Not on file  . Years of education: Not on file  . Highest education level: Not on file  Occupational History  . Not on file  Tobacco Use  . Smoking status: Former Smoker    Quit date: 03/02/1973    Years since quitting: 47.1  . Smokeless tobacco: Never Used  Vaping  Use  . Vaping Use: Never used  Substance and Sexual Activity  . Alcohol use: Yes    Comment: social   . Drug use: Never  . Sexual activity: Not on file  Other Topics Concern  . Not on file  Social History Narrative  . Not on file   Social Determinants of Health   Financial Resource Strain:   . Difficulty of Paying Living Expenses: Not on file  Food Insecurity:   . Worried About Programme researcher, broadcasting/film/video in the Last Year: Not on file  . Ran Out of Food in the Last Year: Not on file  Transportation Needs:   . Lack of Transportation (Medical): Not on file  . Lack of Transportation (Non-Medical): Not on file  Physical Activity:   . Days of Exercise per Week: Not on file  . Minutes of  Exercise per Session: Not on file  Stress:   . Feeling of Stress : Not on file  Social Connections:   . Frequency of Communication with Friends and Family: Not on file  . Frequency of Social Gatherings with Friends and Family: Not on file  . Attends Religious Services: Not on file  . Active Member of Clubs or Organizations: Not on file  . Attends Banker Meetings: Not on file  . Marital Status: Not on file  Intimate Partner Violence:   . Fear of Current or Ex-Partner: Not on file  . Emotionally Abused: Not on file  . Physically Abused: Not on file  . Sexually Abused: Not on file     Review of Systems: General: negative for chills, fever, night sweats or weight changes.  Cardiovascular: negative for chest pain, dyspnea on exertion, edema, orthopnea, palpitations, paroxysmal nocturnal dyspnea or shortness of breath Dermatological: negative for rash Respiratory: negative for cough or wheezing Urologic: negative for hematuria Abdominal: negative for nausea, vomiting, diarrhea, bright red blood per rectum, melena, or hematemesis Neurologic: negative for visual changes, syncope, or dizziness All other systems reviewed and are otherwise negative except as noted above.    Blood pressure (!) 156/88, pulse (!) 47, height 5\' 11"  (1.803 m), weight 203 lb (92.1 kg).  General appearance: alert and no distress Neck: no adenopathy, no carotid bruit, no JVD, supple, symmetrical, trachea midline and thyroid not enlarged, symmetric, no tenderness/mass/nodules Lungs: clear to auscultation bilaterally Heart: regular rate and rhythm, S1, S2 normal, no murmur, click, rub or gallop Extremities: extremities normal, atraumatic, no cyanosis or edema Pulses: 2+ and symmetric Skin: Skin color, texture, turgor normal. No rashes or lesions Neurologic: Alert and oriented X 3, normal strength and tone. Normal symmetric reflexes. Normal coordination and gait  EKG sinus bradycardia at 47 with left  anterior fascicular block and early R wave transition.  I personally reviewed this EKG.  ASSESSMENT AND PLAN:   CAD (coronary artery disease) History of CAD status post MI 1997 with multiple stents since that time beginning in 2016 2009.  I intervened on him for unstable angina 07/21/2010 and dilated his PLA stent for "in-stent restenosis.  His last cath which I performed radially 12/14/2017 revealed an occluded mid LAD with left to left collaterals and patent stents in the PDA and PLA branches.  His EF was 45% at that time.  He said no recurrent symptoms.  Essential hypertension History of essential hypertension blood pressure measured today at 156/88.  He is on lisinopril and metoprolol.  Hyperlipidemia History of hyperlipidemia on statin therapy with lipid profile performed at the [facility] 02/13/2020 revealing total cholesterol 144, LDL 43 and HDL 28.  Obstructive sleep apnea History of obstructive sleep apnea not on CPAP.      Joel Gess MD FACP,FACC,FAHA, [facility] 04/06/2020 10:15 AM

## 2020-04-06 NOTE — Assessment & Plan Note (Signed)
History of essential hypertension blood pressure measured today at 156/88.  He is on lisinopril and metoprolol.

## 2020-04-06 NOTE — Assessment & Plan Note (Signed)
History of hyperlipidemia on statin therapy with lipid profile performed at the [facility] 02/13/2020 revealing total cholesterol 144, LDL 43 and HDL 28.

## 2020-06-14 ENCOUNTER — Inpatient Hospital Stay (HOSPITAL_COMMUNITY)
Admission: EM | Admit: 2020-06-14 | Discharge: 2020-06-17 | DRG: 177 | Disposition: A | Discharge status: Home / Self Care | Payer: Medicare PPO | Attending: Internal Medicine | Admitting: Internal Medicine

## 2020-06-14 ENCOUNTER — Emergency Department (HOSPITAL_COMMUNITY): Payer: Medicare PPO

## 2020-06-14 ENCOUNTER — Other Ambulatory Visit: Payer: Self-pay

## 2020-06-14 ENCOUNTER — Encounter (HOSPITAL_COMMUNITY): Payer: Self-pay | Admitting: Student

## 2020-06-14 DIAGNOSIS — E1165 Type 2 diabetes mellitus with hyperglycemia: Secondary | ICD-10-CM | POA: Diagnosis present

## 2020-06-14 DIAGNOSIS — N183 Chronic kidney disease, stage 3 unspecified: Secondary | ICD-10-CM | POA: Diagnosis present

## 2020-06-14 DIAGNOSIS — Z8249 Family history of ischemic heart disease and other diseases of the circulatory system: Secondary | ICD-10-CM

## 2020-06-14 DIAGNOSIS — E119 Type 2 diabetes mellitus without complications: Secondary | ICD-10-CM

## 2020-06-14 DIAGNOSIS — Z87891 Personal history of nicotine dependence: Secondary | ICD-10-CM

## 2020-06-14 DIAGNOSIS — J1282 Pneumonia due to coronavirus disease 2019: Secondary | ICD-10-CM | POA: Diagnosis not present

## 2020-06-14 DIAGNOSIS — R0789 Other chest pain: Secondary | ICD-10-CM | POA: Diagnosis present

## 2020-06-14 DIAGNOSIS — Z7982 Long term (current) use of aspirin: Secondary | ICD-10-CM

## 2020-06-14 DIAGNOSIS — Z823 Family history of stroke: Secondary | ICD-10-CM

## 2020-06-14 DIAGNOSIS — R531 Weakness: Secondary | ICD-10-CM

## 2020-06-14 DIAGNOSIS — E1169 Type 2 diabetes mellitus with other specified complication: Secondary | ICD-10-CM | POA: Diagnosis present

## 2020-06-14 DIAGNOSIS — E1122 Type 2 diabetes mellitus with diabetic chronic kidney disease: Secondary | ICD-10-CM | POA: Diagnosis present

## 2020-06-14 DIAGNOSIS — T380X5A Adverse effect of glucocorticoids and synthetic analogues, initial encounter: Secondary | ICD-10-CM | POA: Diagnosis present

## 2020-06-14 DIAGNOSIS — E785 Hyperlipidemia, unspecified: Secondary | ICD-10-CM | POA: Diagnosis present

## 2020-06-14 DIAGNOSIS — I2510 Atherosclerotic heart disease of native coronary artery without angina pectoris: Secondary | ICD-10-CM | POA: Diagnosis present

## 2020-06-14 DIAGNOSIS — U071 COVID-19: Principal | ICD-10-CM | POA: Diagnosis present

## 2020-06-14 DIAGNOSIS — I129 Hypertensive chronic kidney disease with stage 1 through stage 4 chronic kidney disease, or unspecified chronic kidney disease: Secondary | ICD-10-CM | POA: Diagnosis present

## 2020-06-14 DIAGNOSIS — Z7984 Long term (current) use of oral hypoglycemic drugs: Secondary | ICD-10-CM

## 2020-06-14 DIAGNOSIS — E1159 Type 2 diabetes mellitus with other circulatory complications: Secondary | ICD-10-CM | POA: Diagnosis present

## 2020-06-14 DIAGNOSIS — J9601 Acute respiratory failure with hypoxia: Secondary | ICD-10-CM | POA: Diagnosis present

## 2020-06-14 DIAGNOSIS — Z79899 Other long term (current) drug therapy: Secondary | ICD-10-CM

## 2020-06-14 DIAGNOSIS — Z955 Presence of coronary angioplasty implant and graft: Secondary | ICD-10-CM

## 2020-06-14 DIAGNOSIS — Z96652 Presence of left artificial knee joint: Secondary | ICD-10-CM | POA: Diagnosis present

## 2020-06-14 DIAGNOSIS — R1010 Upper abdominal pain, unspecified: Secondary | ICD-10-CM

## 2020-06-14 DIAGNOSIS — Z7902 Long term (current) use of antithrombotics/antiplatelets: Secondary | ICD-10-CM

## 2020-06-14 DIAGNOSIS — Z833 Family history of diabetes mellitus: Secondary | ICD-10-CM

## 2020-06-14 DIAGNOSIS — I10 Essential (primary) hypertension: Secondary | ICD-10-CM | POA: Diagnosis present

## 2020-06-14 DIAGNOSIS — N1831 Chronic kidney disease, stage 3a: Secondary | ICD-10-CM | POA: Diagnosis present

## 2020-06-14 LAB — RESP PANEL BY RT-PCR (FLU A&B, COVID) ARPGX2
Influenza A by PCR: NEGATIVE
Influenza B by PCR: NEGATIVE
SARS Coronavirus 2 by RT PCR: POSITIVE — AB

## 2020-06-14 LAB — LACTATE DEHYDROGENASE: LDH: 125 U/L (ref 98–192)

## 2020-06-14 LAB — COMPREHENSIVE METABOLIC PANEL
ALT: 19 U/L (ref 0–44)
AST: 16 U/L (ref 15–41)
Albumin: 4.4 g/dL (ref 3.5–5.0)
Alkaline Phosphatase: 41 U/L (ref 38–126)
Anion gap: 13 (ref 5–15)
BUN: 19 mg/dL (ref 8–23)
CO2: 24 mmol/L (ref 22–32)
Calcium: 9.3 mg/dL (ref 8.9–10.3)
Chloride: 99 mmol/L (ref 98–111)
Creatinine, Ser: 1.3 mg/dL — ABNORMAL HIGH (ref 0.61–1.24)
GFR, Estimated: 56 mL/min — ABNORMAL LOW (ref >60)
Glucose, Bld: 141 mg/dL — ABNORMAL HIGH (ref 70–99)
Potassium: 3.9 mmol/L (ref 3.5–5.1)
Sodium: 136 mmol/L (ref 135–145)
Total Bilirubin: 1.2 mg/dL (ref 0.3–1.2)
Total Protein: 7.4 g/dL (ref 6.5–8.1)

## 2020-06-14 LAB — LACTIC ACID, PLASMA
Lactic Acid, Venous: 1.4 mmol/L (ref 0.5–1.9)
Lactic Acid, Venous: 1.2 mmol/L (ref 0.5–1.9)

## 2020-06-14 LAB — CBC WITH DIFFERENTIAL/PLATELET
Abs Immature Granulocytes: 0.01 10*3/uL (ref 0.00–0.07)
Basophils Absolute: 0 10*3/uL (ref 0.0–0.1)
Basophils Relative: 1 %
Eosinophils Absolute: 0.1 10*3/uL (ref 0.0–0.5)
Eosinophils Relative: 1 %
HCT: 45.4 % (ref 39.0–52.0)
Hemoglobin: 15.2 g/dL (ref 13.0–17.0)
Immature Granulocytes: 0 %
Lymphocytes Relative: 11 %
Lymphs Abs: 0.7 10*3/uL (ref 0.7–4.0)
MCH: 30.6 pg (ref 26.0–34.0)
MCHC: 33.5 g/dL (ref 30.0–36.0)
MCV: 91.5 fL (ref 80.0–100.0)
Monocytes Absolute: 0.7 10*3/uL (ref 0.1–1.0)
Monocytes Relative: 12 %
Neutro Abs: 4.4 10*3/uL (ref 1.7–7.7)
Neutrophils Relative %: 75 %
Platelets: 219 10*3/uL (ref 150–400)
RBC: 4.96 MIL/uL (ref 4.22–5.81)
RDW: 12.7 % (ref 11.5–15.5)
WBC: 6 10*3/uL (ref 4.0–10.5)
nRBC: 0 % (ref 0.0–0.2)

## 2020-06-14 LAB — FERRITIN: Ferritin: 93 ng/mL (ref 24–336)

## 2020-06-14 LAB — TRIGLYCERIDES: Triglycerides: 133 mg/dL (ref <150)

## 2020-06-14 LAB — C-REACTIVE PROTEIN: CRP: 2.7 mg/dL — ABNORMAL HIGH (ref <1.0)

## 2020-06-14 LAB — FIBRINOGEN: Fibrinogen: 440 mg/dL (ref 210–475)

## 2020-06-14 LAB — PROCALCITONIN: Procalcitonin: <0.1 ng/mL

## 2020-06-14 LAB — D-DIMER, QUANTITATIVE: D-Dimer, Quant: 0.32 ug/mL-FEU (ref 0.00–0.50)

## 2020-06-14 MED ORDER — ONDANSETRON HCL 4 MG PO TABS: 4.0000 mg | ORAL_TABLET | Freq: Four times a day (QID) | ORAL | Status: DC | PRN

## 2020-06-14 MED ORDER — METOPROLOL TARTRATE 50 MG PO TABS
50.0000 mg | ORAL_TABLET | Freq: Two times a day (BID) | ORAL | Status: DC
  Administered 2020-06-15 – 2020-06-17 (×5): 50 mg via ORAL
  Filled 2020-06-14: qty 2
  Filled 2020-06-14 (×4): qty 1

## 2020-06-14 MED ORDER — HYDROCOD POLST-CPM POLST ER 10-8 MG/5ML PO SUER
5.0000 mL | Freq: Two times a day (BID) | ORAL | Status: DC | PRN
  Filled 2020-06-14: qty 5

## 2020-06-14 MED ORDER — CLOPIDOGREL BISULFATE 75 MG PO TABS
75.0000 mg | ORAL_TABLET | Freq: Every day | ORAL | Status: DC
  Administered 2020-06-15 – 2020-06-17 (×3): 75 mg via ORAL
  Filled 2020-06-14 (×4): qty 1

## 2020-06-14 MED ORDER — ENOXAPARIN SODIUM 40 MG/0.4ML ~~LOC~~ SOLN
40.0000 mg | SUBCUTANEOUS | Status: DC
  Administered 2020-06-15 – 2020-06-17 (×3): 40 mg via SUBCUTANEOUS
  Filled 2020-06-14 (×3): qty 0.4

## 2020-06-14 MED ORDER — INSULIN ASPART 100 UNIT/ML ~~LOC~~ SOLN
0.0000 [IU] | Freq: Three times a day (TID) | SUBCUTANEOUS | Status: DC
  Administered 2020-06-15: 09:00:00 3 [IU] via SUBCUTANEOUS
  Filled 2020-06-14: qty 0.09

## 2020-06-14 MED ORDER — DEXAMETHASONE 4 MG PO TABS
6.0000 mg | ORAL_TABLET | Freq: Every day | ORAL | Status: DC
  Administered 2020-06-14: 23:00:00 6 mg via ORAL
  Filled 2020-06-14: qty 1

## 2020-06-14 MED ORDER — ONDANSETRON HCL 4 MG/2ML IJ SOLN
4.0000 mg | Freq: Four times a day (QID) | INTRAMUSCULAR | Status: DC | PRN
  Administered 2020-06-15 (×2): 4 mg via INTRAVENOUS
  Filled 2020-06-14 (×2): qty 2

## 2020-06-14 MED ORDER — ASPIRIN 81 MG PO CHEW
81.0000 mg | CHEWABLE_TABLET | Freq: Every day | ORAL | Status: DC
  Administered 2020-06-15 – 2020-06-17 (×3): 81 mg via ORAL
  Filled 2020-06-14 (×3): qty 1

## 2020-06-14 MED ORDER — GUAIFENESIN-DM 100-10 MG/5ML PO SYRP
10.0000 mL | ORAL_SOLUTION | ORAL | Status: DC | PRN
  Administered 2020-06-15 – 2020-06-17 (×2): 10 mL via ORAL
  Filled 2020-06-14 (×2): qty 10

## 2020-06-14 MED ORDER — LISINOPRIL 20 MG PO TABS
40.0000 mg | ORAL_TABLET | Freq: Every day | ORAL | Status: DC
  Administered 2020-06-15 – 2020-06-17 (×3): 40 mg via ORAL
  Filled 2020-06-14 (×3): qty 2

## 2020-06-14 MED ORDER — ALBUTEROL SULFATE HFA 108 (90 BASE) MCG/ACT IN AERS: 1.0000 | INHALATION_SPRAY | RESPIRATORY_TRACT | Status: DC | PRN

## 2020-06-14 MED ORDER — ZINC SULFATE 220 (50 ZN) MG PO CAPS
220.0000 mg | ORAL_CAPSULE | Freq: Every day | ORAL | Status: DC
  Administered 2020-06-15 – 2020-06-17 (×3): 220 mg via ORAL
  Filled 2020-06-14 (×3): qty 1

## 2020-06-14 MED ORDER — ASCORBIC ACID 500 MG PO TABS
500.0000 mg | ORAL_TABLET | Freq: Every day | ORAL | Status: DC
  Administered 2020-06-15 – 2020-06-17 (×3): 500 mg via ORAL
  Filled 2020-06-14 (×3): qty 1

## 2020-06-14 MED ORDER — ROSUVASTATIN CALCIUM 10 MG PO TABS
10.0000 mg | ORAL_TABLET | Freq: Every day | ORAL | Status: DC
  Administered 2020-06-15 – 2020-06-17 (×3): 10 mg via ORAL
  Filled 2020-06-14 (×4): qty 1

## 2020-06-14 MED ORDER — ACETAMINOPHEN 325 MG PO TABS
650.0000 mg | ORAL_TABLET | Freq: Four times a day (QID) | ORAL | Status: DC | PRN
  Administered 2020-06-15: 650 mg via ORAL
  Filled 2020-06-14: qty 2

## 2020-06-14 NOTE — ED Notes (Signed)
High falls alarm in place, on, yellow socks and arm band also placed on patient.

## 2020-06-14 NOTE — H&P (Signed)
History and Physical    Joel Stanley TGG:[id number] DOB: June 12, 1941 DOA: 06/14/2020  [facility]  Patient coming from: Home via EMS  I have personally briefly reviewed patient's old medical records in [place]  Chief Complaint: Generalized weakness, positive COVID-19 test  HPI: Joel Stanley is a 79 y.o. male with medical history significant for CAD s/p PCI with multiple stenting, CKD stage III, T2DM, HTN, HLD who presents to the ED for evaluation of generalized weakness and positive COVID-19 test.  Patient states he has been feeling generally weak the last 1 or 2 days.  He has had some mild myalgias.  He reports a new cough which is nonproductive.  He has occasional feeling of shortness of breath with deep inspiration.  [facility] Cov[facility] with them in the last 2 days.  Earlier today he felt so weak that he fell out of bed and was unable to stand up on his own.  He was not down on the ground long before he had some assistance to get off the floor.  He has also been feeling foggy headed.  He came to the ED for further evaluation.  He otherwise denies any chest pain, subjective fevers, chills, abdominal pain, nausea, vomiting, diarrhea, or dysuria.  He says he has been fully vaccinated against COVID-19 as well as receiving a booster shot.  He says he occasionally uses a cane to ambulate.  He lives with his significant other.  ED Course:  Initial vitals showed BP 161/91, pulse 78, RR 15, temp 99.9 F, SPO2 97% on room air initially, decreased to 92% while waiting in the ED.  Labs show sodium 136, potassium 3.9, bicarb 24, BUN 19, creatinine 1.30, serum glucose 141, LFTs within normal limits, WBC 6.0, hemoglobin 15.2, platelets 219,000, lactic acid 1.4, D-dimer 0.32, procalcitonin <0.10, LDH 125, ferritin 93, CRP 2.7.  Blood cultures were obtained and pending.  SARS-CoV-2 PCR is positive.  Portable chest x-ray shows  left basilar opacity.  CT head without contrast is negative for evidence of acute intracranial abnormality.  Patient was felt to have significant generalized weakness and unsafe to return home therefore the hospitalist service was consulted to admit for further evaluation and management.  Review of Systems: All systems reviewed and are negative except as documented in history of present illness above.   Past Medical History:  Diagnosis Date  . Coronary artery disease   . Exertional angina (HCC)   . Family history of heart disease   . Hyperlipidemia   . Hypertension   . Obstructive sleep apnea   . Type 2 diabetes mellitus ([place])     Past Surgical History:  Procedure Laterality Date  . APPENDECTOMY  1969  . CARDIAC CATHETERIZATION  08/21/2010   PLA 75% fairly focal "in-stent" restenosis-cutting balloon atherectomy performed, expanded to 10 atm, resutling in reductin to 20% without dissection. PDA 90% proximal stenosis-stented with a 2.5x85m Promus Element stent, deployed at 14 atm (2.353m resulting in reduction of 90% to 0% residual).  . CARDIAC CATHETERIZATION  05/03/2008   PLA 100% occluded proximal stenosis-stented with a [id number] stent deployed at 14 atm, resulting in reduction of 100% to 0% residual  . CARDIAC CATHETERIZATION  01/27/2005   RCA 80-85% stenosis, stenting performed with a [id number] DES, dilated to 3.75 with a 0% residual.  . CARDIOVASCULAR STRESS TEST  07/16/2009   Mild-moderate perfusion defect seen in Basal inferior and Mid inferior  regions. No scitigraphic evidence of inducible myocardial ischemia. No ECG changes. EKG negative for ischemia. Post-stress EF 56%.  . CHOLECYSTECTOMY  1985  . HERNIA REPAIR  1993, 2003  . LEFT HEART CATH AND CORONARY ANGIOGRAPHY N/A 12/14/2017   Procedure: LEFT HEART CATH AND CORONARY ANGIOGRAPHY;  Surgeon: Lorretta Harp, MD;  Location: [facility];  Service: Cardiovascular;  Laterality: N/A;  . TONSILLECTOMY  1946   . TOTAL KNEE ARTHROPLASTY Left 2004    Social History:  reports that he quit smoking about 47 years ago. He has never used smokeless tobacco. He reports current alcohol use. He reports that he does not use drugs.  No Known Allergies  Family History  Problem Relation Age of Onset  . Heart attack Father 37  . Diabetes Maternal Grandmother   . Stroke Maternal Grandmother 2  . Heart attack Brother 68  . Heart attack Maternal Grandfather 64  . Heart attack Paternal Grandmother   . Heart attack Paternal Grandfather   . Pulmonary embolism Paternal Grandfather   . Heart attack Brother 37     Prior to Admission medications   Medication Sig Start Date End Date Taking? Authorizing Provider  acetaminophen (TYLENOL) 650 MG CR tablet Take 650 mg by mouth 2 (two) times daily.    [provider]  aspirin 81 MG chewable tablet Chew 1 tablet (81 mg total) by mouth daily. 12/15/17   Geradine Girt, DO  Cholecalciferol (VITAMIN D-3) 1000 UNITS CAPS Take 2,000 Units by mouth daily.    [provider]  clopidogrel (PLAVIX) 75 MG tablet Take 75 mg by mouth daily.    [provider]  lisinopril (PRINIVIL,ZESTRIL) 40 MG tablet Take 40 mg by mouth 2 (two) times daily.     [provider]  metFORMIN (GLUCOPHAGE) 500 MG tablet Take 1 tablet (500 mg total) by mouth 2 (two) times daily with a meal. 12/17/17   Eulogio Bear U, DO  metoprolol (LOPRESSOR) 100 MG tablet Take 50 mg by mouth 2 (two) times daily. Taking 1/2 tablet twice a day     [provider]  nitroGLYCERIN (NITROSTAT) 0.4 MG SL tablet Place 1 tablet (0.4 mg total) under the tongue every 5 (five) minutes x 3 doses as needed for chest pain. 12/14/17   Geradine Girt, DO  rosuvastatin (CRESTOR) 10 MG tablet Take 10 mg by mouth daily.    [provider]    Physical Exam: Vitals:   06/14/20 1800 06/14/20 1900 06/14/20 1930 06/14/20 2028  BP: (!) 156/82 (!) 146/129 (!) 146/78 132/80  Pulse: 77  77 77 79  Resp: 17 (!) 24 (!) 25 18  Temp:      TempSrc:      SpO2: 92% 95% 92% 95%  Weight:      Height:       Constitutional: Resting supine in bed, appears tired but in NAD, calm, comfortable Eyes: PERRL, lids and conjunctivae normal ENMT: Mucous membranes are moist. Posterior pharynx clear of any exudate or lesions.Normal dentition.  Neck: normal, supple, no masses. Respiratory: clear to auscultation bilaterally, no wheezing, no crackles. Normal respiratory effort. No accessory muscle use.  Cardiovascular: Regular rate and rhythm, no murmurs / rubs / gallops. No extremity edema. 2+ pedal pulses. Abdomen: no tenderness, no masses palpated. No hepatosplenomegaly. Bowel sounds positive.  Musculoskeletal: no clubbing / cyanosis. No joint deformity upper and lower extremities. Good ROM, no contractures. Normal muscle tone.  Skin: no rashes, lesions, ulcers. No induration Neurologic:  CN 2-12 grossly intact. Sensation intact, Strength 5/5 in all 4.  Psychiatric: Normal judgment and insight. Alert and oriented x 3. Normal mood.   Labs on Admission: I have personally reviewed following labs and imaging studies  CBC: Recent Labs  Lab 06/14/20 1634  WBC 6.0  NEUTROABS 4.4  HGB 15.2  HCT 45.4  MCV 91.5  PLT 782   Basic Metabolic Panel: Recent Labs  Lab 06/14/20 1634  NA 136  K 3.9  CL 99  CO2 24  GLUCOSE 141*  BUN 19  CREATININE 1.30*  CALCIUM 9.3   GFR: Estimated Creatinine Clearance: 50.6 mL/min (A) (by C-G formula based on SCr of 1.3 mg/dL (H)). Liver Function Tests: Recent Labs  Lab 06/14/20 1634  AST 16  ALT 19  ALKPHOS 41  BILITOT 1.2  PROT 7.4  ALBUMIN 4.4   No results for input(s): LIPASE, AMYLASE in the last 168 hours. No results for input(s): AMMONIA in the last 168 hours. Coagulation Profile: No results for input(s): INR, PROTIME in the last 168 hours. Cardiac Enzymes: No results for input(s): CKTOTAL, CKMB, CKMBINDEX, TROPONINI in the last 168  hours. BNP (last 3 results) No results for input(s): PROBNP in the last 8760 hours. HbA1C: No results for input(s): HGBA1C in the last 72 hours. CBG: No results for input(s): GLUCAP in the last 168 hours. Lipid Profile: Recent Labs    06/14/20 1634  TRIG 133   Thyroid Function Tests: No results for input(s): TSH, T4TOTAL, FREET4, T3FREE, THYROIDAB in the last 72 hours. Anemia Panel: Recent Labs    06/14/20 1634  FERRITIN 93   Urine analysis:    Component Value Date/Time   COLORURINE YELLOW 04/30/2008 2316   APPEARANCEUR CLEAR 04/30/2008 2316   LABSPEC 1.019 04/30/2008 2316   PHURINE 6.5 04/30/2008 2316   GLUCOSEU NEGATIVE 04/30/2008 2316   HGBUR NEGATIVE 04/30/2008 2316   BILIRUBINUR NEGATIVE 04/30/2008 2316   KETONESUR NEGATIVE 04/30/2008 2316   PROTEINUR NEGATIVE 04/30/2008 2316   UROBILINOGEN 1.0 04/30/2008 2316   NITRITE NEGATIVE 04/30/2008 2316   LEUKOCYTESUR  04/30/2008 2316    NEGATIVE MICROSCOPIC NOT DONE ON URINES WITH NEGATIVE PROTEIN, BLOOD, LEUKOCYTES, NITRITE, OR GLUCOSE <1000 mg/dL.    Radiological Exams on Admission: CT Head Wo Contrast  Result Date: 06/14/2020 CLINICAL DATA:  Mental status change, unknown cause. Additional history provided: COVID positive. EXAM: CT HEAD WITHOUT CONTRAST TECHNIQUE: Contiguous axial images were obtained from the base of the skull through the vertex without intravenous contrast. COMPARISON:  No pertinent prior exams available for comparison. FINDINGS: Brain: Mild cerebral and cerebellar atrophy. Ill-defined hypoattenuation within the cerebral white matter is nonspecific, but compatible with chronic small vessel ischemic disease. There is no acute intracranial hemorrhage. No demarcated cortical infarct. No extra-axial fluid collection. No evidence of intracranial mass. No midline shift. Vascular: No hyperdense vessel.  Atherosclerotic calcifications. Skull: Normal. Negative for fracture or focal suspicious osseous lesion.  Sinuses/Orbits: Visualized orbits show no acute finding. Ethmoid sinus mucosal thickening (moderate/severe right, moderate left). Mild mucosal thickening elsewhere within the paranasal sinuses. IMPRESSION: No evidence of acute intracranial abnormality. Mild generalized parenchymal atrophy with chronic small vessel ischemic disease. Paranasal sinus mucosal thickening, most notably ethmoidal. Electronically Signed   By: Kellie Simmering DO   On: 06/14/2020 18:52   DG Chest Port 1 View  Result Date: 06/14/2020 CLINICAL DATA:  Cough, COVID positive. EXAM: PORTABLE CHEST 1 VIEW COMPARISON:  12/13/2017 FINDINGS: Mild left basilar opacity, suspicious for pneumonia in this patient with known  COVID. Right lung is clear. No pleural effusion or pneumothorax. The heart is top-normal in size. Degenerative changes of the thoracic spine. IMPRESSION: Mild left basilar opacity, suspicious for pneumonia in this patient with known COVID. Electronically Signed   By: Julian Hy M.D.   On: 06/14/2020 16:58    EKG: Personally reviewed. Normal sinus rhythm, Q waves inferior leads, when compared to prior sinus bradycardia has resolved.  Assessment/Plan Principal Problem:   Pneumonia due to COVID-19 virus Active Problems:   CAD (coronary artery disease)   Hypertension associated with diabetes ([place])   Type 2 diabetes mellitus ([place])   CKD (chronic kidney disease), stage III ([place])   Hyperlipidemia associated with type 2 diabetes mellitus ([place])   Generalized weakness  Joel Stanley is a 79 y.o. male with medical history significant for CAD s/p PCI with multiple stenting, CKD stage III, T2DM, HTN, HLD who is admitted with COVID-19 pneumonia with generalized weakness.  COVID-19 pneumonia: SARS-CoV-2 PCR positive 06/14/20.  Respiratory status currently stable however has possible early developing left basilar pneumonia on CXR with SPO2 intermittently <94% on room air while at rest.  Patient reports receiving 2 doses  of the Pfizer vaccine plus booster shot. -Start oral Decadron 6 mg daily -Consider IV remdesivir if SPO2 maintains <94% -Albuterol as needed, antitussives as needed -Supplemental O2 as needed  Generalized weakness: Secondary to viral illness.  Unable to safely ambulate at time of admission.  Request PT/OT eval.  CAD s/p PCI with multiple stents: Chronic and stable.  Denies any chest pain.  Continue aspirin/Plavix, rosuvastatin, Lopressor.  T2DM: Hold Metformin and place on sensitive SSI.  Hypertension: Continue Lopressor and lisinopril.  CKD stage III: Appears stable.  Continue monitor.  Hyperlipidemia: Continue rosuvastatin.  DVT prophylaxis: Lovenox Code Status: Full code, confirmed with patient Family Communication: Discussed with patient, he has discussed with family Disposition Plan: Home pending PT/OT eval Consults called: None Admission status:  Status is: Observation  The patient remains OBS appropriate and will d/c before 2 midnights.  Dispo: The patient is from: Home              Anticipated d/c is to: Home              Anticipated d/c date is: 1 day              Patient currently is not medically stable to d/c.   Zada Finders MD [facility]  If 7PM-7AM, please contact night-coverage [email or web address]  06/14/2020, 9:04 PM

## 2020-06-14 NOTE — ED Triage Notes (Signed)
Arrives via EMS from home. C/C COVID +, [facility] and [facility] COVID +. Family reports fever, patient is alert and oriented. Patient has a cough, decreased mobility and ambulation per family.   Vitals w/ EMS  HR 78 RR 16  O2: 96% T 98.7

## 2020-06-14 NOTE — ED Provider Notes (Signed)
Joel Stanley Provider Note   CSN: [id number] Arrival date & time: 06/14/20  1556     History Chief Complaint  Patient presents with  . Cough  . Covid Positive    Joel Stanley is a 79 y.o. male.  Patient is a 79 year old male with past medical history of coronary artery disease with prior stenting, type 2 diabetes, hypertension, hyperlipidemia, obstructive sleep apnea.  Patient brought by EMS for evaluation of weakness, decreased mobility, and confusion.  This has worsened over the past several days.  Patient was seen earlier today at the [place] facility and had a positive Covid test.  Patient reports cough and feeling short of breath.  He denies chest pain.  He denies fevers.  Patient does appear somewhat confused and has difficulty recalling events of the recent past and has difficulty in providing details of his medical history.  The history is provided by the patient.  Cough Cough characteristics:  Non-productive Severity:  Moderate Onset quality:  Gradual Duration:  3 days Timing:  Constant Progression:  Worsening Chronicity:  New Relieved by:  Nothing Worsened by:  Nothing Ineffective treatments:  None tried      Past Medical History:  Diagnosis Date  . Coronary artery disease   . Exertional angina (HCC)   . Family history of heart disease   . Hyperlipidemia   . Hypertension   . Obstructive sleep apnea   . Type 2 diabetes mellitus [facility])     Patient Active Problem List   Diagnosis Date Noted  . CKD (chronic kidney disease), stage III ([place]) 12/13/2017  . Chest pain 12/13/2017  . Essential hypertension 03/02/2013  . Hyperlipidemia 03/02/2013  . Type 2 diabetes mellitus ([place]) 03/02/2013  . Obstructive sleep apnea 03/02/2013  . CAD (coronary artery disease) 04/12/2008  . INCISIONAL HERNIA 04/12/2008  . PERSONAL HISTORY OF COLONIC POLYPS 04/12/2008    Past Surgical History:  Procedure Laterality Date  . APPENDECTOMY  1969   . CARDIAC CATHETERIZATION  08/21/2010   PLA 75% fairly focal "in-stent" restenosis-cutting balloon atherectomy performed, expanded to 10 atm, resutling in reductin to 20% without dissection. PDA 90% proximal stenosis-stented with a 2.5x30m Promus Element stent, deployed at 14 atm (2.31m resulting in reduction of 90% to 0% residual).  . CARDIAC CATHETERIZATION  05/03/2008   PLA 100% occluded proximal stenosis-stented with a 2.75x2880mromus stent deployed at 14 atm, resulting in reduction of 100% to 0% residual  . CARDIAC CATHETERIZATION  01/27/2005   RCA 80-85% stenosis, stenting performed with a [id number] DES, dilated to 3.75 with a 0% residual.  . CARDIOVASCULAR STRESS TEST  07/16/2009   Mild-moderate perfusion defect seen in Basal inferior and Mid inferior regions. No scitigraphic evidence of inducible myocardial ischemia. No ECG changes. EKG negative for ischemia. Post-stress EF 56%.  . CHOLECYSTECTOMY  1985  . HERNIA REPAIR  1993, 2003  . LEFT HEART CATH AND CORONARY ANGIOGRAPHY N/A 12/14/2017   Procedure: LEFT HEART CATH AND CORONARY ANGIOGRAPHY;  Surgeon: BerrLorretta Harp;  [facility];  Service: Cardiovascular;  Laterality: N/A;  . TONSILLECTOMY  1946  . TOTAL KNEE ARTHROPLASTY Left 2004       Family History  Problem Relation Age of Onset  . Heart attack Father 39  82Diabetes Maternal Grandmother   . Stroke Maternal Grandmother 75  72Heart attack Brother 45  46Heart attack Maternal Grandfather 64  . Heart attack Paternal Grandmother   . Heart attack Paternal  Grandfather   . Pulmonary embolism Paternal Grandfather   . Heart attack Brother 32    Social History   Tobacco Use  . Smoking status: Former Smoker    Quit date: 03/02/1973    Years since quitting: 47.3  . Smokeless tobacco: Never Used  Vaping Use  . Vaping Use: Never used  Substance Use Topics  . Alcohol use: Yes    Comment: social   . Drug use: Never    Home Medications Prior to  Admission medications   Medication Sig Start Date End Date Taking? Authorizing Provider  acetaminophen (TYLENOL) 650 MG CR tablet Take 650 mg by mouth 2 (two) times daily.    [provider]  aspirin 81 MG chewable tablet Chew 1 tablet (81 mg total) by mouth daily. 12/15/17   Geradine Girt, DO  Cholecalciferol (VITAMIN D-3) 1000 UNITS CAPS Take 2,000 Units by mouth daily.    [provider]  clopidogrel (PLAVIX) 75 MG tablet Take 75 mg by mouth daily.    [provider]  lisinopril (PRINIVIL,ZESTRIL) 40 MG tablet Take 40 mg by mouth 2 (two) times daily.     [provider]  metFORMIN (GLUCOPHAGE) 500 MG tablet Take 1 tablet (500 mg total) by mouth 2 (two) times daily with a meal. 12/17/17   Eulogio Bear U, DO  metoprolol (LOPRESSOR) 100 MG tablet Take 50 mg by mouth 2 (two) times daily. Taking 1/2 tablet twice a day     [provider]  nitroGLYCERIN (NITROSTAT) 0.4 MG SL tablet Place 1 tablet (0.4 mg total) under the tongue every 5 (five) minutes x 3 doses as needed for chest pain. 12/14/17   Geradine Girt, DO  rosuvastatin (CRESTOR) 10 MG tablet Take 10 mg by mouth daily.    [provider]    Allergies    Patient has no known allergies.  Review of Systems   Review of Systems  Respiratory: Positive for cough.   All other systems reviewed and are negative.   Physical Exam Updated Vital Signs BP (!) 161/91 (BP Location: Right Arm)   Pulse 78   Temp 99.9 F (37.7 C) (Oral)   Resp 15   Ht 6' (1.829 m)   Wt 90.3 kg   SpO2 97%   BMI 26.99 kg/m   Physical Exam Vitals and nursing note reviewed.  Constitutional:      General: He is not in acute distress.    Appearance: He is well-developed and well-nourished. He is not diaphoretic.  HENT:     Head: Normocephalic and atraumatic.     Mouth/Throat:     Mouth: Oropharynx is clear and moist. Mucous membranes are moist.     Pharynx: No oropharyngeal exudate.  Cardiovascular:      Rate and Rhythm: Normal rate and regular rhythm.     Heart sounds: No murmur heard. No friction rub.  Pulmonary:     Effort: Pulmonary effort is normal. No respiratory distress.     Breath sounds: Normal breath sounds. No wheezing or rales.  Abdominal:     General: Bowel sounds are normal. There is no distension.     Palpations: Abdomen is soft.     Tenderness: There is no abdominal tenderness.  Musculoskeletal:        General: No swelling, tenderness or edema. Normal range of motion.     Cervical back: Normal range of motion and neck supple.     Right lower leg: No edema.  Left lower leg: No edema.  Skin:    General: Skin is warm and dry.  Neurological:     Mental Status: He is alert and oriented to person, place, and time.     Coordination: Coordination normal.     ED Results / Procedures / Treatments   Labs (all labs ordered are listed, but only abnormal results are displayed) Labs Reviewed  RESP PANEL BY RT-PCR (FLU A&B, COVID) ARPGX2  CULTURE, BLOOD (ROUTINE X 2)  CULTURE, BLOOD (ROUTINE X 2)  LACTIC ACID, PLASMA  LACTIC ACID, PLASMA  CBC WITH DIFFERENTIAL/PLATELET  COMPREHENSIVE METABOLIC PANEL  D-DIMER, QUANTITATIVE (NOT AT [facility])  PROCALCITONIN  LACTATE DEHYDROGENASE  FERRITIN  TRIGLYCERIDES  FIBRINOGEN  C-REACTIVE PROTEIN    EKG EKG Interpretation  Date/Time:  Thursday June 14 2020 17:28:05 EST Ventricular Rate:  79 PR Interval:    QRS Duration: 107 QT Interval:  392 QTC Calculation: 450 R Axis:   -66 Text Interpretation: Sinus rhythm LVH with secondary repolarization abnormality Inferior infarct, old Anterior Q waves, possibly due to LVH No significant change since 12/14/2017 Confirmed by Veryl Speak [phone]) on 06/14/2020 5:32:21 PM   Radiology No results found.  Procedures Procedures (including critical care time)  Medications Ordered in ED Medications - No data to display  ED Course  I have reviewed the triage vital signs and the  nursing notes.  Pertinent labs & imaging results that were available during my care of the patient were reviewed by me and considered in my medical decision making (see chart for details).    MDM Rules/Calculators/A&P  Patient is a 79 year old male with past medical history as per HPI presenting with complaints of weakness and confusion.  He was diagnosed with COVID-19 several days ago.  Patient arrives here with stable vital signs, but appears disoriented and very weak.  Patient's laboratory studies are reassuring and physical examination is nonfocal except for generalized weakness.  Patient will be admitted for further treatment and observation.  Final Clinical Impression(s) / ED Diagnoses Final diagnoses:  None    Rx / DC Orders ED Discharge Orders    None       Veryl Speak, MD 06/14/20 2315

## 2020-06-15 ENCOUNTER — Inpatient Hospital Stay (HOSPITAL_COMMUNITY): Payer: Medicare PPO

## 2020-06-15 DIAGNOSIS — J9601 Acute respiratory failure with hypoxia: Secondary | ICD-10-CM | POA: Diagnosis present

## 2020-06-15 DIAGNOSIS — E1122 Type 2 diabetes mellitus with diabetic chronic kidney disease: Secondary | ICD-10-CM | POA: Diagnosis present

## 2020-06-15 DIAGNOSIS — Z7984 Long term (current) use of oral hypoglycemic drugs: Secondary | ICD-10-CM | POA: Diagnosis not present

## 2020-06-15 DIAGNOSIS — U071 COVID-19: Secondary | ICD-10-CM | POA: Diagnosis present

## 2020-06-15 DIAGNOSIS — I2510 Atherosclerotic heart disease of native coronary artery without angina pectoris: Secondary | ICD-10-CM | POA: Diagnosis present

## 2020-06-15 DIAGNOSIS — E785 Hyperlipidemia, unspecified: Secondary | ICD-10-CM

## 2020-06-15 DIAGNOSIS — Z87891 Personal history of nicotine dependence: Secondary | ICD-10-CM | POA: Diagnosis not present

## 2020-06-15 DIAGNOSIS — E1165 Type 2 diabetes mellitus with hyperglycemia: Secondary | ICD-10-CM | POA: Diagnosis present

## 2020-06-15 DIAGNOSIS — E113213 Type 2 diabetes mellitus with mild nonproliferative diabetic retinopathy with macular edema, bilateral: Secondary | ICD-10-CM

## 2020-06-15 DIAGNOSIS — I152 Hypertension secondary to endocrine disorders: Secondary | ICD-10-CM

## 2020-06-15 DIAGNOSIS — E1169 Type 2 diabetes mellitus with other specified complication: Secondary | ICD-10-CM | POA: Diagnosis present

## 2020-06-15 DIAGNOSIS — Z955 Presence of coronary angioplasty implant and graft: Secondary | ICD-10-CM | POA: Diagnosis not present

## 2020-06-15 DIAGNOSIS — Z823 Family history of stroke: Secondary | ICD-10-CM | POA: Diagnosis not present

## 2020-06-15 DIAGNOSIS — Z79899 Other long term (current) drug therapy: Secondary | ICD-10-CM | POA: Diagnosis not present

## 2020-06-15 DIAGNOSIS — R531 Weakness: Secondary | ICD-10-CM

## 2020-06-15 DIAGNOSIS — T380X5A Adverse effect of glucocorticoids and synthetic analogues, initial encounter: Secondary | ICD-10-CM | POA: Diagnosis present

## 2020-06-15 DIAGNOSIS — R0789 Other chest pain: Secondary | ICD-10-CM | POA: Diagnosis present

## 2020-06-15 DIAGNOSIS — Z7902 Long term (current) use of antithrombotics/antiplatelets: Secondary | ICD-10-CM | POA: Diagnosis not present

## 2020-06-15 DIAGNOSIS — E1159 Type 2 diabetes mellitus with other circulatory complications: Secondary | ICD-10-CM

## 2020-06-15 DIAGNOSIS — Z7982 Long term (current) use of aspirin: Secondary | ICD-10-CM | POA: Diagnosis not present

## 2020-06-15 DIAGNOSIS — I129 Hypertensive chronic kidney disease with stage 1 through stage 4 chronic kidney disease, or unspecified chronic kidney disease: Secondary | ICD-10-CM | POA: Diagnosis present

## 2020-06-15 DIAGNOSIS — N1831 Chronic kidney disease, stage 3a: Secondary | ICD-10-CM

## 2020-06-15 DIAGNOSIS — Z8249 Family history of ischemic heart disease and other diseases of the circulatory system: Secondary | ICD-10-CM | POA: Diagnosis not present

## 2020-06-15 DIAGNOSIS — J1282 Pneumonia due to coronavirus disease 2019: Secondary | ICD-10-CM | POA: Diagnosis present

## 2020-06-15 DIAGNOSIS — Z833 Family history of diabetes mellitus: Secondary | ICD-10-CM | POA: Diagnosis not present

## 2020-06-15 DIAGNOSIS — Z96652 Presence of left artificial knee joint: Secondary | ICD-10-CM | POA: Diagnosis present

## 2020-06-15 LAB — CBC WITH DIFFERENTIAL/PLATELET
Abs Immature Granulocytes: 0.01 10*3/uL (ref 0.00–0.07)
Basophils Absolute: 0 10*3/uL (ref 0.0–0.1)
Basophils Relative: 0 %
Eosinophils Absolute: 0 10*3/uL (ref 0.0–0.5)
Eosinophils Relative: 0 %
HCT: 46.6 % (ref 39.0–52.0)
Hemoglobin: 15.8 g/dL (ref 13.0–17.0)
Immature Granulocytes: 0 %
Lymphocytes Relative: 11 %
Lymphs Abs: 0.7 10*3/uL (ref 0.7–4.0)
MCH: 30.7 pg (ref 26.0–34.0)
MCHC: 33.9 g/dL (ref 30.0–36.0)
MCV: 90.7 fL (ref 80.0–100.0)
Monocytes Absolute: 0.7 10*3/uL (ref 0.1–1.0)
Monocytes Relative: 10 %
Neutro Abs: 5.3 10*3/uL (ref 1.7–7.7)
Neutrophils Relative %: 79 %
Platelets: 233 10*3/uL (ref 150–400)
RBC: 5.14 MIL/uL (ref 4.22–5.81)
RDW: 12.6 % (ref 11.5–15.5)
WBC: 6.7 10*3/uL (ref 4.0–10.5)
nRBC: 0 % (ref 0.0–0.2)

## 2020-06-15 LAB — TROPONIN I (HIGH SENSITIVITY)
Troponin I (High Sensitivity): 12 ng/L (ref <18)
Troponin I (High Sensitivity): 10 ng/L (ref <18)
Troponin I (High Sensitivity): 9 ng/L (ref <18)

## 2020-06-15 LAB — MAGNESIUM
Magnesium: 1.8 mg/dL (ref 1.7–2.4)
Magnesium: 1.8 mg/dL (ref 1.7–2.4)

## 2020-06-15 LAB — COMPREHENSIVE METABOLIC PANEL
ALT: 19 U/L (ref 0–44)
AST: 16 U/L (ref 15–41)
Albumin: 4.4 g/dL (ref 3.5–5.0)
Alkaline Phosphatase: 40 U/L (ref 38–126)
Anion gap: 14 (ref 5–15)
BUN: 19 mg/dL (ref 8–23)
CO2: 25 mmol/L (ref 22–32)
Calcium: 9.1 mg/dL (ref 8.9–10.3)
Chloride: 96 mmol/L — ABNORMAL LOW (ref 98–111)
Creatinine, Ser: 1.16 mg/dL (ref 0.61–1.24)
GFR, Estimated: >60 mL/min (ref >60)
Glucose, Bld: 200 mg/dL — ABNORMAL HIGH (ref 70–99)
Potassium: 3.9 mmol/L (ref 3.5–5.1)
Sodium: 135 mmol/L (ref 135–145)
Total Bilirubin: 1.4 mg/dL — ABNORMAL HIGH (ref 0.3–1.2)
Total Protein: 7.7 g/dL (ref 6.5–8.1)

## 2020-06-15 LAB — C-REACTIVE PROTEIN: CRP: 4.5 mg/dL — ABNORMAL HIGH (ref <1.0)

## 2020-06-15 LAB — LIPASE, BLOOD: Lipase: 51 U/L (ref 11–51)

## 2020-06-15 LAB — HEMOGLOBIN A1C
Hgb A1c MFr Bld: 6.6 % — ABNORMAL HIGH (ref 4.8–5.6)
Mean Plasma Glucose: 142.72 mg/dL

## 2020-06-15 LAB — D-DIMER, QUANTITATIVE: D-Dimer, Quant: 0.43 ug/mL-FEU (ref 0.00–0.50)

## 2020-06-15 LAB — CBG MONITORING, ED
Glucose-Capillary: 225 mg/dL — ABNORMAL HIGH (ref 70–99)
Glucose-Capillary: 268 mg/dL — ABNORMAL HIGH (ref 70–99)

## 2020-06-15 LAB — FERRITIN: Ferritin: 121 ng/mL (ref 24–336)

## 2020-06-15 LAB — PHOSPHORUS: Phosphorus: 3.9 mg/dL (ref 2.5–4.6)

## 2020-06-15 LAB — GLUCOSE, CAPILLARY
Glucose-Capillary: 202 mg/dL — ABNORMAL HIGH (ref 70–99)
Glucose-Capillary: 184 mg/dL — ABNORMAL HIGH (ref 70–99)

## 2020-06-15 MED ORDER — DEXAMETHASONE 4 MG PO TABS
4.0000 mg | ORAL_TABLET | Freq: Every day | ORAL | Status: DC
  Administered 2020-06-16 – 2020-06-17 (×2): 4 mg via ORAL
  Filled 2020-06-15 (×2): qty 1

## 2020-06-15 MED ORDER — NITROGLYCERIN 0.4 MG SL SUBL
0.4000 mg | SUBLINGUAL_TABLET | SUBLINGUAL | Status: DC | PRN
  Administered 2020-06-15: 10:00:00 0.4 mg via SUBLINGUAL
  Filled 2020-06-15: qty 1

## 2020-06-15 MED ORDER — INSULIN ASPART 100 UNIT/ML ~~LOC~~ SOLN
0.0000 [IU] | Freq: Every day | SUBCUTANEOUS | Status: DC
  Administered 2020-06-16: 21:00:00 2 [IU] via SUBCUTANEOUS
  Filled 2020-06-15: qty 0.05

## 2020-06-15 MED ORDER — PROCHLORPERAZINE EDISYLATE 10 MG/2ML IJ SOLN
10.0000 mg | Freq: Once | INTRAMUSCULAR | Status: AC
  Administered 2020-06-15: 12:00:00 10 mg via INTRAVENOUS
  Filled 2020-06-15: qty 2

## 2020-06-15 MED ORDER — SODIUM CHLORIDE 0.9 % IV SOLN
200.0000 mg | Freq: Once | INTRAVENOUS | Status: AC
  Administered 2020-06-15: 08:00:00 200 mg via INTRAVENOUS
  Filled 2020-06-15: qty 200

## 2020-06-15 MED ORDER — LINAGLIPTIN 5 MG PO TABS
5.0000 mg | ORAL_TABLET | Freq: Every day | ORAL | Status: DC
  Administered 2020-06-15 – 2020-06-17 (×3): 5 mg via ORAL
  Filled 2020-06-15 (×3): qty 1

## 2020-06-15 MED ORDER — SODIUM CHLORIDE 0.9 % IV SOLN
100.0000 mg | Freq: Every day | INTRAVENOUS | Status: DC
  Administered 2020-06-16 – 2020-06-17 (×2): 100 mg via INTRAVENOUS
  Filled 2020-06-15 (×2): qty 20

## 2020-06-15 MED ORDER — INSULIN ASPART 100 UNIT/ML ~~LOC~~ SOLN
0.0000 [IU] | Freq: Three times a day (TID) | SUBCUTANEOUS | Status: DC
  Administered 2020-06-15: 17:00:00 5 [IU] via SUBCUTANEOUS
  Administered 2020-06-16 (×3): 3 [IU] via SUBCUTANEOUS
  Administered 2020-06-17: 09:00:00 5 [IU] via SUBCUTANEOUS
  Filled 2020-06-15: qty 0.15

## 2020-06-15 NOTE — Progress Notes (Signed)
PT Cancellation Note  Patient Details Name: Joel Stanley MRN: [id number] DOB: 1941-02-12   Cancelled Treatment:    Reason Eval/Treat Not Completed: Medical issues which prohibited therapy, per RN, hold for now, recently having chest pains. Will follow when medically ready.   Rada Hay 06/15/2020, 12:34 PM Blanchard Kelch PT Acute Rehabilitation Services Pager [phone] Office [phone]

## 2020-06-15 NOTE — Plan of Care (Signed)

## 2020-06-15 NOTE — Progress Notes (Signed)
PROGRESS NOTE  DRAYVEN MARCHENA  NTZ:[id number] DOB: Aug 21, 1940 DOA: 06/14/2020 PCP: [facility]   Brief Narrative: Joel Stanley is a 79 y.o. male with a history of CAD s/p PCI x6, stage IIIa CKD, T2DM, HTN, HLD, and covid-19 diagnosed at the [facility] 12/23 who presented later that day to the ED due to profound fatigue and weakness as well as cough and dyspnea. Work up revealed a left basilar opacity, CRP elevated at 4.5, troponin negative and ECG without ST elevations. He remained too weak to be safely discharged, so admission was requested. He remains severely weak requiring a bed pan for toileting, and has developed severe substernal chest pain. Remdesivir and steroids have been started for suspected early covid pneumonia and steroid-induced hyperglycemia is requiring insulin for management.   Assessment & Plan: Principal Problem:   Pneumonia due to COVID-19 virus Active Problems:   CAD (coronary artery disease)   Hypertension associated with diabetes (HCC)   Type 2 diabetes mellitus (HCC)   CKD (chronic kidney disease), stage III (HCC)   Hyperlipidemia associated with type 2 diabetes mellitus (HCC)   Generalized weakness  Breakthrough covid-19 pneumonia: SARS-CoV-2 positive on 12/23 despite Pfizer vaccinations x3 in Jan 2021 and in the Summer of 2021. - Will start remdesivir as he seems early in disease process (day ~3 of symptoms). This, of course, could portend worsening from this time. - Agree with steroids based on the patient's infiltrate, tachypnea, and SpO2 in low 90%'s at rest (too weak to get exertional pulse oximetry). Will decrease dose of steroids due to hyperglycemia.   Generalized weakness: Negative CT head, no focal deficits.  - PT/OT requested. The patient's baseline is ambulatory, he occasionally uses a walker, but is independent. This morning he was tachypneic at rest, mildly labored just sitting up for lung exam and requested a bed pan as he couldn't stand with  assistance.   NIDT2DM with steroid-induced hyperglycemia: HbA1c 6.6% indicating adequate control  - Augment SSI to moderate, add HS coverage, decrease steroid intensity, add linagliptin.  CAD with history of 6 stents now with chest pain, HTN, HLD:  - Recheck troponin, thus far not elevated.  - Give pt's home beta blocker, aspirin, plavix, statin.  - Pt takes NTG intermittently which is ordered.  - Monitor on telemetry.    Vomiting: This could be underlying cause of chest pain.  - Continue zofran, add IV compazine.  - Add on lipase, with isolated elevation in bilirubin, tenderness, check RUQ U/S - If this continues, would start IVF.  Stage IIIa CKD: Based on available values.  - Monitor renal function.   DVT prophylaxis: Lovenox Code Status: Full Family Communication: Patient declines for me to call any family.  Disposition Plan:  Status is: Inpatient  Remains inpatient appropriate because:Unsafe d/c plan and Inpatient level of care appropriate due to severity of illness  Dispo: The patient is from: Home              Anticipated d/c is to: Uncertain              Anticipated d/c date is: 1 day              Patient currently is not medically stable to d/c.  Consultants:   None  Procedures:   None  Antimicrobials:  Remdesivir   Subjective: Patient feels severely fatigued, foggy headed but is oriented and able to tell me an accurate medical history and cogent HPI. He also reports about a week of  intermittent pressure-like discomfort in the upper abdomen/lower chest that is non radiating that at times has resolved on its own, at times improved with nitroglycerin, doesn't seem to have relationship to po intake or exertion or breathing/coughing/laughing. It is constant currently, moderate, present at rest.  Objective: Vitals:   06/15/20 1002 06/15/20 1015 06/15/20 1030 06/15/20 1036  BP:  (!) 158/85 131/86   Pulse: 89 92 87   Resp: 17 16 (!) 23   Temp:    98.8 F (37.1  C)  TempSrc:    Oral  SpO2: 95% 98% 95% 97%  Weight:      Height:        Intake/Output Summary (Last 24 hours) at 06/15/2020 1252 Last data filed at 06/15/2020 0930 Gross per 24 hour  Intake 250 ml  Output --  Net 250 ml   Filed Weights   06/14/20 1617  Weight: 90.3 kg   Gen: 79 y.o. male in some distress, tachypneic, diaphoretic having just vomited. Pulm: Tachypneic on room air, clear. CV: Regular rate and rhythm. No murmur, rub, or gallop. No JVD, no pitting pedal edema. GI: Abdomen soft, tender on palpation of epigastrium, non-distended, with normoactive bowel sounds. No organomegaly or masses felt. Ext: Warm, no deformities Skin: No rashes, lesions or ulcers on visualized skin Neuro: Alert and oriented. No focal neurological deficits. Psych: Judgement and insight appear normal. Mood & affect appropriate.   Data Reviewed: I have personally reviewed following labs and imaging studies  CBC: Recent Labs  Lab 06/14/20 1634 06/15/20 0309  WBC 6.0 6.7  NEUTROABS 4.4 5.3  HGB 15.2 15.8  HCT 45.4 46.6  MCV 91.5 90.7  PLT 219 233   Basic Metabolic Panel: Recent Labs  Lab 06/14/20 1634 06/15/20 0148 06/15/20 0309  NA 136  --  135  K 3.9  --  3.9  CL 99  --  96*  CO2 24  --  25  GLUCOSE 141*  --  200*  BUN 19  --  19  CREATININE 1.30*  --  1.16  CALCIUM 9.3  --  9.1  MG  --  1.8 1.8  PHOS  --   --  3.9   GFR: Estimated Creatinine Clearance: 56.7 mL/min (by C-G formula based on SCr of 1.16 mg/dL). Liver Function Tests: Recent Labs  Lab 06/14/20 1634 06/15/20 0309  AST 16 16  ALT 19 19  ALKPHOS 41 40  BILITOT 1.2 1.4*  PROT 7.4 7.7  ALBUMIN 4.4 4.4   HbA1C: Recent Labs    06/15/20 0309  HGBA1C 6.6*   CBG: Recent Labs  Lab 06/15/20 0806 06/15/20 1147  GLUCAP 225* 268*   Lipid Profile: Recent Labs    06/14/20 1634  TRIG 133   Thyroid Function Tests: No results for input(s): TSH, T4TOTAL, FREET4, T3FREE, THYROIDAB in the last 72  hours. Anemia Panel: Recent Labs    06/14/20 1634 06/15/20 0309  FERRITIN 93 121   Urine analysis:    Component Value Date/Time   COLORURINE YELLOW 04/30/2008 2316   APPEARANCEUR CLEAR 04/30/2008 2316   LABSPEC 1.019 04/30/2008 2316   PHURINE 6.5 04/30/2008 2316   GLUCOSEU NEGATIVE 04/30/2008 2316   HGBUR NEGATIVE 04/30/2008 2316   BILIRUBINUR NEGATIVE 04/30/2008 2316   KETONESUR NEGATIVE 04/30/2008 2316   PROTEINUR NEGATIVE 04/30/2008 2316   UROBILINOGEN 1.0 04/30/2008 2316   NITRITE NEGATIVE 04/30/2008 2316   LEUKOCYTESUR  04/30/2008 2316    NEGATIVE MICROSCOPIC NOT DONE ON URINES WITH NEGATIVE PROTEIN, BLOOD, LEUKOCYTES,  NITRITE, OR GLUCOSE <1000 mg/dL.   Recent Results (from the past 240 hour(s))  Resp Panel by RT-PCR (Flu A&B, Covid) Nasopharyngeal Swab     Status: Abnormal   Collection Time: 06/14/20  4:34 PM   Specimen: Nasopharyngeal Swab; Nasopharyngeal(NP) swabs in vial transport medium  Result Value Ref Range Status   SARS Coronavirus 2 by RT PCR POSITIVE (A) NEGATIVE Final    Comment: RESULT CALLED TO, READ BACK BY AND VERIFIED WITH: DOSTER,S. RN @2026  06/14/20 BILLINGSLEY,L    Influenza A by PCR NEGATIVE NEGATIVE Final   Influenza B by PCR NEGATIVE NEGATIVE Final    Comment: Performed at [facility], [address], [place], [place]      Radiology Studies: CT Head Wo Contrast  Result Date: 06/14/2020 CLINICAL DATA:  Mental status change, unknown cause. Additional history provided: COVID positive. EXAM: CT HEAD WITHOUT CONTRAST TECHNIQUE: Contiguous axial images were obtained from the base of the skull through the vertex without intravenous contrast. COMPARISON:  No pertinent prior exams available for comparison. FINDINGS: Brain: Mild cerebral and cerebellar atrophy. Ill-defined hypoattenuation within the cerebral white matter is nonspecific, but compatible with chronic small vessel ischemic disease. There is no acute intracranial  hemorrhage. No demarcated cortical infarct. No extra-axial fluid collection. No evidence of intracranial mass. No midline shift. Vascular: No hyperdense vessel.  Atherosclerotic calcifications. Skull: Normal. Negative for fracture or focal suspicious osseous lesion. Sinuses/Orbits: Visualized orbits show no acute finding. Ethmoid sinus mucosal thickening (moderate/severe right, moderate left). Mild mucosal thickening elsewhere within the paranasal sinuses. IMPRESSION: No evidence of acute intracranial abnormality. Mild generalized parenchymal atrophy with chronic small vessel ischemic disease. Paranasal sinus mucosal thickening, most notably ethmoidal. Electronically Signed   By: 06/16/2020 DO   On: 06/14/2020 18:52   DG Chest Port 1 View  Result Date: 06/14/2020 CLINICAL DATA:  Cough, COVID positive. EXAM: PORTABLE CHEST 1 VIEW COMPARISON:  12/13/2017 FINDINGS: Mild left basilar opacity, suspicious for pneumonia in this patient with known COVID. Right lung is clear. No pleural effusion or pneumothorax. The heart is top-normal in size. Degenerative changes of the thoracic spine. IMPRESSION: Mild left basilar opacity, suspicious for pneumonia in this patient with known COVID. Electronically Signed   By: 12/15/2017 M.D.   On: 06/14/2020 16:58    Scheduled Meds:  vitamin C  500 mg Oral Daily   aspirin  81 mg Oral Daily   clopidogrel  75 mg Oral Daily   dexamethasone  6 mg Oral QHS   enoxaparin (LOVENOX) injection  40 mg Subcutaneous Q24H   insulin aspart  0-9 Units Subcutaneous TID WC   lisinopril  40 mg Oral Daily   metoprolol tartrate  50 mg Oral BID   rosuvastatin  10 mg Oral Daily   zinc sulfate  220 mg Oral Daily   Continuous Infusions:  [START ON 06/16/2020] remdesivir 100 mg in NS 100 mL       LOS: 0 days   Time spent: 35 minutes.  06/18/2020, MD [facility] [email or web address] 06/15/2020, 12:52 PM

## 2020-06-15 NOTE — Progress Notes (Signed)
OT Cancellation Note  Patient Details Name: SABA NEUMAN MRN: [id number] DOB: 1940-11-17   Cancelled Treatment:    Reason Eval/Treat Not Completed: Other (comment) First attempt in AM RN reports patient vomited, second attempt RN asked to hold, patient having chest pain. Will re-attempt at later date.  Marlyce Huge OT OT pager: [phone]   Carmelia Roller 06/15/2020, 12:33 PM

## 2020-06-15 NOTE — ED Notes (Signed)
Report called to floor

## 2020-06-15 NOTE — ED Notes (Signed)
Attempted to call report, no answer

## 2020-06-15 NOTE — ED Notes (Signed)
Attempted to call report again. No answer.

## 2020-06-15 NOTE — ED Notes (Signed)
Pt bedding wet, linen and gown changed. Given new sheets, blankets and gown. Urinal at bedside. Call light within reach. Bed alarm in place.

## 2020-06-15 NOTE — ED Notes (Signed)
Pt states pain relieved by NTG x1. Denies any chest pain at this time.

## 2020-06-15 NOTE — ED Notes (Addendum)
Pt called out, c/o midsternal non reproducible nonraidating chest pain 7/10. States he can not really describe it. Sudden onset. 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 EKG done, MD [facility] paged. EKG given transmitted and handed to ED physician at this time.

## 2020-06-15 NOTE — ED Notes (Signed)
US at bedside

## 2020-06-15 NOTE — ED Notes (Signed)
Patients son, Beckett Maden phone number [phone].

## 2020-06-15 NOTE — ED Notes (Signed)
Date and time results received: 06/15/20 2025 (use smartphrase ".now" to insert current time)  Test: COVID Critical Value: POSITIVE  Name of Provider Notified: Delo, MD  Orders Received? Or Actions Taken?: Orders Received - See Orders for details

## 2020-06-15 NOTE — ED Notes (Addendum)
Patient complaining of new onset heart burn and nausea. New EKG performed, updated Troponin lab taken and Zofran given. Manuela Schwartz, NP aware.

## 2020-06-15 NOTE — ED Notes (Signed)
Pt vomitting 

## 2020-06-15 NOTE — ED Notes (Signed)
Lab to add magnesium on to light green tube previously sent to lab

## 2020-06-16 LAB — C-REACTIVE PROTEIN: CRP: 4.6 mg/dL — ABNORMAL HIGH (ref <1.0)

## 2020-06-16 LAB — COMPREHENSIVE METABOLIC PANEL
ALT: 20 U/L (ref 0–44)
AST: 20 U/L (ref 15–41)
Albumin: 4.1 g/dL (ref 3.5–5.0)
Alkaline Phosphatase: 40 U/L (ref 38–126)
Anion gap: 13 (ref 5–15)
BUN: 25 mg/dL — ABNORMAL HIGH (ref 8–23)
CO2: 25 mmol/L (ref 22–32)
Calcium: 8.8 mg/dL — ABNORMAL LOW (ref 8.9–10.3)
Chloride: 98 mmol/L (ref 98–111)
Creatinine, Ser: 1.22 mg/dL (ref 0.61–1.24)
GFR, Estimated: >60 mL/min (ref >60)
Glucose, Bld: 191 mg/dL — ABNORMAL HIGH (ref 70–99)
Potassium: 4.6 mmol/L (ref 3.5–5.1)
Sodium: 136 mmol/L (ref 135–145)
Total Bilirubin: 0.7 mg/dL (ref 0.3–1.2)
Total Protein: 7.3 g/dL (ref 6.5–8.1)

## 2020-06-16 LAB — CBC WITH DIFFERENTIAL/PLATELET
Abs Immature Granulocytes: 0.03 10*3/uL (ref 0.00–0.07)
Basophils Absolute: 0 10*3/uL (ref 0.0–0.1)
Basophils Relative: 0 %
Eosinophils Absolute: 0 10*3/uL (ref 0.0–0.5)
Eosinophils Relative: 0 %
HCT: 47.5 % (ref 39.0–52.0)
Hemoglobin: 16 g/dL (ref 13.0–17.0)
Immature Granulocytes: 0 %
Lymphocytes Relative: 6 %
Lymphs Abs: 0.6 10*3/uL — ABNORMAL LOW (ref 0.7–4.0)
MCH: 30.6 pg (ref 26.0–34.0)
MCHC: 33.7 g/dL (ref 30.0–36.0)
MCV: 90.8 fL (ref 80.0–100.0)
Monocytes Absolute: 0.7 10*3/uL (ref 0.1–1.0)
Monocytes Relative: 7 %
Neutro Abs: 8.6 10*3/uL — ABNORMAL HIGH (ref 1.7–7.7)
Neutrophils Relative %: 87 %
Platelets: 284 10*3/uL (ref 150–400)
RBC: 5.23 MIL/uL (ref 4.22–5.81)
RDW: 12.4 % (ref 11.5–15.5)
WBC: 10 10*3/uL (ref 4.0–10.5)
nRBC: 0 % (ref 0.0–0.2)

## 2020-06-16 LAB — GLUCOSE, CAPILLARY
Glucose-Capillary: 188 mg/dL — ABNORMAL HIGH (ref 70–99)
Glucose-Capillary: 158 mg/dL — ABNORMAL HIGH (ref 70–99)
Glucose-Capillary: 169 mg/dL — ABNORMAL HIGH (ref 70–99)
Glucose-Capillary: 243 mg/dL — ABNORMAL HIGH (ref 70–99)

## 2020-06-16 LAB — D-DIMER, QUANTITATIVE: D-Dimer, Quant: 1.02 ug/mL-FEU — ABNORMAL HIGH (ref 0.00–0.50)

## 2020-06-16 MED ORDER — FAMOTIDINE 20 MG PO TABS
20.0000 mg | ORAL_TABLET | Freq: Every day | ORAL | Status: DC
  Administered 2020-06-16 – 2020-06-17 (×2): 20 mg via ORAL
  Filled 2020-06-16 (×2): qty 1

## 2020-06-16 MED ORDER — ALUM & MAG HYDROXIDE-SIMETH 200-200-20 MG/5ML PO SUSP
30.0000 mL | Freq: Four times a day (QID) | ORAL | Status: DC | PRN
  Administered 2020-06-16: 05:00:00 30 mL via ORAL
  Filled 2020-06-16: qty 30

## 2020-06-16 NOTE — Evaluation (Signed)
Physical Therapy Evaluation Patient Details Name: Joel Stanley MRN: [id number] DOB: 01-29-1941 Today's Date: 06/16/2020   History of Present Illness  Pt admitted with generalized weakness and dx with COVID.  Pt with hx of CAD, DM, CKD and MI  Clinical Impression  Pt admitted as above and presenting with functional mobility limitations 2* mild ambulatory balance deficits.  Pt ambulating 200' in room with mild general instability but no overt balance loss and maintaining SaO2 at 93% or higher on RA.  Pt hopeful for dc home tomorrow.    Follow Up Recommendations No PT follow up    Equipment Recommendations  None recommended by PT    Recommendations for Other Services       Precautions / Restrictions Precautions Precautions: Fall Restrictions Weight Bearing Restrictions: No      Mobility  Bed Mobility Overal bed mobility: Modified Independent             General bed mobility comments: pt supine<>sit unassisted    Transfers Overall transfer level: Needs assistance Equipment used: None Transfers: Sit to/from Stand Sit to Stand: Supervision         General transfer comment: for safety only  Ambulation/Gait Ambulation/Gait assistance: Min guard;Supervision Gait Distance (Feet): 200 Feet Assistive device: None Gait Pattern/deviations: Step-through pattern;Decreased step length - right;Decreased step length - left;Shuffle     General Gait Details: Ambulated in room only.  Mild general instability but no LOB.  Pt able to side-step and back-step with min difficulty and no LOB  Stairs            Wheelchair Mobility    Modified Rankin (Stroke Patients Only)       Balance Overall balance assessment: Mild deficits observed, not formally tested                                           Pertinent Vitals/Pain Pain Assessment: No/denies pain    Home Living Family/patient expects to be discharged to:: Private residence Living  Arrangements: Spouse/significant other Available Help at Discharge: Available PRN/intermittently Type of Home: Mobile home Home Access: Stairs to enter Entrance Stairs-Rails: None Entrance Stairs-Number of Steps: 3 Home Layout: One level Home Equipment: Walker - 2 wheels;Cane - single point;Bedside commode Additional Comments: Pt states he lives with GF but she will be staying elsewhere when he returns home to avoid her being infected with COVID    Prior Function Level of Independence: Independent               Hand Dominance        Extremity/Trunk Assessment   Upper Extremity Assessment Upper Extremity Assessment: Overall WFL for tasks assessed    Lower Extremity Assessment Lower Extremity Assessment: Overall WFL for tasks assessed    Cervical / Trunk Assessment Cervical / Trunk Assessment: Normal  Communication   Communication: No difficulties  Cognition Arousal/Alertness: Awake/alert Behavior During Therapy: WFL for tasks assessed/performed Overall Cognitive Status: Within Functional Limits for tasks assessed                                        General Comments      Exercises     Assessment/Plan    PT Assessment Patient needs continued PT services  PT Problem List Decreased balance;Decreased activity tolerance  PT Treatment Interventions      PT Goals (Current goals can be found in the Care Plan section)  Acute Rehab PT Goals Patient Stated Goal: HOME PT Goal Formulation: With patient Time For Goal Achievement: 06/30/20 Potential to Achieve Goals: Good    Frequency Min 3X/week   Barriers to discharge Decreased caregiver support Sig other will be staying out of the home to prevent her being infected with COVID when pt dc home    Co-evaluation               AM-PAC PT "6 Clicks" Mobility  Outcome Measure Help needed turning from your back to your side while in a flat bed without using bedrails?: None Help needed  moving from lying on your back to sitting on the side of a flat bed without using bedrails?: None Help needed moving to and from a bed to a chair (including a wheelchair)?: A Little Help needed standing up from a chair using your arms (e.g., wheelchair or bedside chair)?: A Little Help needed to walk in hospital room?: A Little Help needed climbing 3-5 steps with a railing? : A Little 6 Click Score: 20    End of Session   Activity Tolerance: Patient tolerated treatment well Patient left: in bed;with call bell/phone within reach Nurse Communication: Mobility status PT Visit Diagnosis: Unsteadiness on feet (R26.81);Difficulty in walking, not elsewhere classified (R26.2)    Time: 6314-9702 PT Time Calculation (min) (ACUTE ONLY): 31 min   Charges:   PT Evaluation $PT Eval Low Complexity: 1 Low          Mauro Kaufmann PT Acute Rehabilitation Services Pager [phone] Office [phone]   Murriel Eidem 06/16/2020, 4:05 PM

## 2020-06-16 NOTE — Plan of Care (Signed)

## 2020-06-16 NOTE — Progress Notes (Signed)
PROGRESS NOTE  Joel Stanley  PFX:[id number] DOB: June 16, 1941 DOA: 06/14/2020 PCP: [facility]   Brief Narrative: Joel Stanley is a 79 y.o. male with a history of CAD s/p PCI x6, stage IIIa CKD, T2DM, HTN, HLD, and covid-19 diagnosed at the [facility] 12/23 who presented later that day to the ED due to profound fatigue and weakness as well as cough and dyspnea. Work up revealed a left basilar opacity, CRP elevated at 4.5, troponin negative and ECG without ST elevations. He remained too weak to be safely discharged, so admission was requested. He remains severely weak requiring a bed pan for toileting, and has developed severe substernal chest pain. Remdesivir and steroids have been started for suspected early covid pneumonia and steroid-induced hyperglycemia is requiring insulin for management.   Assessment & Plan: Principal Problem:   Pneumonia due to COVID-19 virus Active Problems:   CAD (coronary artery disease)   Hypertension associated with diabetes (HCC)   Type 2 diabetes mellitus (HCC)   CKD (chronic kidney disease), stage III (HCC)   Hyperlipidemia associated with type 2 diabetes mellitus (HCC)   Generalized weakness  Breakthrough covid-19 pneumonia: SARS-CoV-2 positive on 12/23 despite Pfizer vaccinations x2 in Jan 2021 and in the Summer of 2021 Acute hypoxic respiratory failure, mild but ongoing -Continue Remdesivir -Continue steroids -Mental status and hypoxia have improved drastically over the past 24 hours likely in the setting of early treatment and vaccination status  Generalized weakness: Negative CT head, no focal deficits.  - PT/OT requested. The patient's baseline is ambulatory, he occasionally uses a walker, but is independent. This morning he was tachypneic at rest, mildly labored just sitting up for lung exam and requested a bed pan as he couldn't stand with assistance.   NIDT2DM with steroid-induced hyperglycemia: HbA1c 6.6% indicating adequate control  - Augment  SSI to moderate, add HS coverage, decrease steroid intensity, add linagliptin.  CAD with history of 6 stents; HTN, HLD:  - Patient's mental status now back to baseline denies any further chest pain -Given previous altered mental status patient may have been confusing chest pain with dyspnea, regardless this is resolved with negative troponin and EKG - Give pt's home beta blocker, aspirin, plavix, statin.      Vomiting: This could be underlying cause of atypical chest pain.  - Continue zofran, PRN IV compazine.  - Add on lipase, with isolated elevation in bilirubin, tenderness, RUQ [place] no acute process  Stage IIIa CKD: Based on available values.  - Monitor renal function.   DVT prophylaxis: Lovenox Code Status: Full Family Communication: Incorrect contact numbers, will attempt to update with patient Disposition Plan:  Status is: Inpatient  Remains inpatient appropriate because:Unsafe d/c plan and Inpatient level of care appropriate due to severity of illness  Dispo: The patient is from: Home              Anticipated d/c is to: Likely home pending clinical course as above              Anticipated d/c date is: 24 to 48 hours              Patient currently is not medically stable to d/c.  Consultants:   None  Procedures:   None  Antimicrobials:  Remdesivir   Subjective: No acute issues or events overnight, patient feels remarkably improved over the past 24 hours.  He indicates he was previously disoriented fatigued weak and could not walk.  He still feels somewhat fatigued with poor  ambulation status but his mental status is back to baseline.  He denies chest pain, shortness of breath, nausea, vomiting, diarrhea, constipation, headache, fevers, chills.  Objective: Vitals:   06/15/20 1556 06/15/20 2041 06/15/20 2336 06/16/20 0407  BP: (!) 160/89 (!) 174/96 (!) 146/93 (!) 155/91  Pulse: 72 83 67 71  Resp: 18 18 18 18   Temp: 98.6 F (37 C) 98.7 F (37.1 C) 98.4 F (36.9 C)  98.5 F (36.9 C)  TempSrc:  Oral Oral Oral  SpO2: 95% 94% 94% 96%  Weight:      Height:        Intake/Output Summary (Last 24 hours) at 06/16/2020 0731 Last data filed at 06/16/2020 0600 Gross per 24 hour  Intake 490 ml  Output 1250 ml  Net -760 ml   Filed Weights   06/14/20 1617  Weight: 90.3 kg   General:  Pleasantly resting in bed, No acute distress. HEENT:  Normocephalic atraumatic.  Sclerae nonicteric, noninjected.  Extraocular movements intact bilaterally. Neck:  Without mass or deformity.  Trachea is midline. Lungs:  Clear to auscultate bilaterally without rhonchi, wheeze, or rales. Heart:  Regular rate and rhythm.  Without murmurs, rubs, or gallops. Abdomen:  Soft, nontender, nondistended.  Without guarding or rebound. Extremities: Without cyanosis, clubbing, edema, or obvious deformity. Vascular:  Dorsalis pedis and posterior tibial pulses palpable bilaterally. Skin:  Warm and dry, no erythema, no ulcerations.   Data Reviewed: I have personally reviewed following labs and imaging studies  CBC: Recent Labs  Lab 06/14/20 1634 06/15/20 0309 06/16/20 0341  WBC 6.0 6.7 10.0  NEUTROABS 4.4 5.3 8.6*  HGB 15.2 15.8 16.0  HCT 45.4 46.6 47.5  MCV 91.5 90.7 90.8  PLT 219 233 284   Basic Metabolic Panel: Recent Labs  Lab 06/14/20 1634 06/15/20 0148 06/15/20 0309 06/16/20 0341  NA 136  --  135 136  K 3.9  --  3.9 4.6  CL 99  --  96* 98  CO2 24  --  25 25  GLUCOSE 141*  --  200* 191*  BUN 19  --  19 25*  CREATININE 1.30*  --  1.16 1.22  CALCIUM 9.3  --  9.1 8.8*  MG  --  1.8 1.8  --   PHOS  --   --  3.9  --    GFR: Estimated Creatinine Clearance: 53.9 mL/min (by C-G formula based on SCr of 1.22 mg/dL). Liver Function Tests: Recent Labs  Lab 06/14/20 1634 06/15/20 0309 06/16/20 0341  AST 16 16 20   ALT 19 19 20   ALKPHOS 41 40 40  BILITOT 1.2 1.4* 0.7  PROT 7.4 7.7 7.3  ALBUMIN 4.4 4.4 4.1   HbA1C: Recent Labs    06/15/20 0309  HGBA1C 6.6*    CBG: Recent Labs  Lab 06/15/20 0806 06/15/20 1147 06/15/20 1646 06/15/20 2040  GLUCAP 225* 268* 202* 184*   Lipid Profile: Recent Labs    06/14/20 1634  TRIG 133   Thyroid Function Tests: No results for input(s): TSH, T4TOTAL, FREET4, T3FREE, THYROIDAB in the last 72 hours. Anemia Panel: Recent Labs    06/14/20 1634 06/15/20 0309  FERRITIN 93 121   Urine analysis:    Component Value Date/Time   COLORURINE YELLOW 04/30/2008 2316   APPEARANCEUR CLEAR 04/30/2008 2316   LABSPEC 1.019 04/30/2008 2316   PHURINE 6.5 04/30/2008 2316   GLUCOSEU NEGATIVE 04/30/2008 2316   HGBUR NEGATIVE 04/30/2008 2316   BILIRUBINUR NEGATIVE 04/30/2008 2316   KETONESUR NEGATIVE 04/30/2008  2316   PROTEINUR NEGATIVE 04/30/2008 2316   UROBILINOGEN 1.0 04/30/2008 2316   NITRITE NEGATIVE 04/30/2008 2316   LEUKOCYTESUR  04/30/2008 2316    NEGATIVE MICROSCOPIC NOT DONE ON URINES WITH NEGATIVE PROTEIN, BLOOD, LEUKOCYTES, NITRITE, OR GLUCOSE <1000 mg/dL.   Recent Results (from the past 240 hour(s))  Resp Panel by RT-PCR (Flu A&B, Covid) Nasopharyngeal Swab     Status: Abnormal   Collection Time: 06/14/20  4:34 PM   Specimen: Nasopharyngeal Swab; Nasopharyngeal(NP) swabs in vial transport medium  Result Value Ref Range Status   SARS Coronavirus 2 by RT PCR POSITIVE (A) NEGATIVE Final    Comment: RESULT CALLED TO, READ BACK BY AND VERIFIED WITH: DOSTER,S. RN @2026  06/14/20 BILLINGSLEY,L    Influenza A by PCR NEGATIVE NEGATIVE Final   Influenza B by PCR NEGATIVE NEGATIVE Final    Comment: Performed at [facility], [address] [place], [place]  Blood Culture (routine x 2)     Status: None (Preliminary result)   Collection Time: 06/14/20  5:51 PM   Specimen: BLOOD  Result Value Ref Range Status   Specimen Description   Final    BLOOD LEFT ANTECUBITAL Performed at [facility], [address] [place], [place]    Special Requests    Final    BOTTLES DRAWN AEROBIC AND ANAEROBIC Blood Culture adequate volume Performed at [facility], [address] [place], [place]    Culture   Final    NO GROWTH < 24 HOURS Performed at [facility], [address] [place], [place]    Report Status PENDING  Incomplete  Blood Culture (routine x 2)     Status: None (Preliminary result)   Collection Time: 06/14/20  5:51 PM   Specimen: BLOOD  Result Value Ref Range Status   Specimen Description   Final    BLOOD BLOOD LEFT FOREARM Performed at [facility], [address], [place], [place]    Special Requests   Final    BOTTLES DRAWN AEROBIC AND ANAEROBIC Blood Culture adequate volume Performed at [facility], [address] [place], [place]    Culture   Final    NO GROWTH < 24 HOURS Performed at [facility], [address] [place], [place]    Report Status PENDING  Incomplete      Radiology Studies: CT Head Wo Contrast  Result Date: 06/14/2020 CLINICAL DATA:  Mental status change, unknown cause. Additional history provided: COVID positive. EXAM: CT HEAD WITHOUT CONTRAST TECHNIQUE: Contiguous axial images were obtained from the base of the skull through the vertex without intravenous contrast. COMPARISON:  No pertinent prior exams available for comparison. FINDINGS: Brain: Mild cerebral and cerebellar atrophy. Ill-defined hypoattenuation within the cerebral white matter is nonspecific, but compatible with chronic small vessel ischemic disease. There is no acute intracranial hemorrhage. No demarcated cortical infarct. No extra-axial fluid collection. No evidence of intracranial mass. No midline shift. Vascular: No hyperdense vessel.  Atherosclerotic calcifications. Skull: Normal. Negative for fracture or focal suspicious osseous lesion. Sinuses/Orbits: Visualized orbits show no acute finding. Ethmoid sinus mucosal  thickening (moderate/severe right, moderate left). Mild mucosal thickening elsewhere within the paranasal sinuses. IMPRESSION: No evidence of acute intracranial abnormality. Mild generalized parenchymal atrophy with chronic small vessel ischemic disease. Paranasal sinus mucosal thickening, most notably ethmoidal. Electronically Signed   By: 06/16/2020 DO   On: 06/14/2020  18:52   DG Chest Port 1 View  Result Date: 06/14/2020 CLINICAL DATA:  Cough, COVID positive. EXAM: PORTABLE CHEST 1 VIEW COMPARISON:  12/13/2017 FINDINGS: Mild left basilar opacity, suspicious for pneumonia in this patient with known COVID. Right lung is clear. No pleural effusion or pneumothorax. The heart is top-normal in size. Degenerative changes of the thoracic spine. IMPRESSION: Mild left basilar opacity, suspicious for pneumonia in this patient with known COVID. Electronically Signed   By: Charline BillsSriyesh  Krishnan M.D.   On: 06/14/2020 16:58   US Abdomen Limited RUQ (LIVER/GB)  Result Date: 06/15/2020 CLINICAL DATA:  Elevated bilirubin with right upper quadrant pain EXAM: ULTRASOUND ABDOMEN LIMITED RIGHT UPPER QUADRANT COMPARISON:  04/13/2008 FINDINGS: Gallbladder: Surgically removed. Common bile duct: Diameter: 6.4 mm Liver: Increased in echogenicity consistent with fatty infiltration. Portal vein is patent on color Doppler imaging with normal direction of blood flow towards the liver. Other: Incidental note is made of a 2.6 cm upper pole right renal cyst. IMPRESSION: Fatty liver. Status post cholecystectomy. Incidental note is made of right renal cyst. Electronically Signed   By: Alcide CleverMark  Lukens M.D.   On: 06/15/2020 15:48    Scheduled Meds: . vitamin C  500 mg Oral Daily  . aspirin  81 mg Oral Daily  . clopidogrel  75 mg Oral Daily  . dexamethasone  4 mg Oral Daily  . enoxaparin (LOVENOX) injection  40 mg Subcutaneous Q24H  . insulin aspart  0-15 Units Subcutaneous TID WC  . insulin aspart  0-5 Units Subcutaneous QHS  .  linagliptin  5 mg Oral Daily  . lisinopril  40 mg Oral Daily  . metoprolol tartrate  50 mg Oral BID  . rosuvastatin  10 mg Oral Daily  . zinc sulfate  220 mg Oral Daily   Continuous Infusions: . remdesivir 100 mg in NS 100 mL       LOS: 1 day   Time spent: 35 minutes.  Azucena FallenWilliam C Adar Rase, DO [facility] [email or web address] 06/16/2020, 7:31 AM

## 2020-06-16 NOTE — Progress Notes (Signed)
Patient asleep with episodes of desaturation in the low 80's. Unable to prone, repositioned to his side, and satcame up to 90% shortly after he began to desat to 80-85%. Replaced the O2 sensor and O2 at 1L via [place], sats now in the mid 90"s. Will continue to monitor the patient.

## 2020-06-17 LAB — COMPREHENSIVE METABOLIC PANEL
ALT: 25 U/L (ref 0–44)
AST: 23 U/L (ref 15–41)
Albumin: 3.8 g/dL (ref 3.5–5.0)
Alkaline Phosphatase: 42 U/L (ref 38–126)
Anion gap: 11 (ref 5–15)
BUN: 34 mg/dL — ABNORMAL HIGH (ref 8–23)
CO2: 25 mmol/L (ref 22–32)
Calcium: 8.5 mg/dL — ABNORMAL LOW (ref 8.9–10.3)
Chloride: 99 mmol/L (ref 98–111)
Creatinine, Ser: 1.23 mg/dL (ref 0.61–1.24)
GFR, Estimated: 60 mL/min — ABNORMAL LOW (ref >60)
Glucose, Bld: 162 mg/dL — ABNORMAL HIGH (ref 70–99)
Potassium: 4.3 mmol/L (ref 3.5–5.1)
Sodium: 135 mmol/L (ref 135–145)
Total Bilirubin: 0.6 mg/dL (ref 0.3–1.2)
Total Protein: 6.8 g/dL (ref 6.5–8.1)

## 2020-06-17 LAB — CBC WITH DIFFERENTIAL/PLATELET
Abs Immature Granulocytes: 0.02 10*3/uL (ref 0.00–0.07)
Basophils Absolute: 0 10*3/uL (ref 0.0–0.1)
Basophils Relative: 0 %
Eosinophils Absolute: 0 10*3/uL (ref 0.0–0.5)
Eosinophils Relative: 0 %
HCT: 46.7 % (ref 39.0–52.0)
Hemoglobin: 15.3 g/dL (ref 13.0–17.0)
Immature Granulocytes: 0 %
Lymphocytes Relative: 11 %
Lymphs Abs: 1 10*3/uL (ref 0.7–4.0)
MCH: 30.2 pg (ref 26.0–34.0)
MCHC: 32.8 g/dL (ref 30.0–36.0)
MCV: 92.1 fL (ref 80.0–100.0)
Monocytes Absolute: 0.9 10*3/uL (ref 0.1–1.0)
Monocytes Relative: 10 %
Neutro Abs: 7.2 10*3/uL (ref 1.7–7.7)
Neutrophils Relative %: 79 %
Platelets: 289 10*3/uL (ref 150–400)
RBC: 5.07 MIL/uL (ref 4.22–5.81)
RDW: 12.6 % (ref 11.5–15.5)
WBC: 9.1 10*3/uL (ref 4.0–10.5)
nRBC: 0 % (ref 0.0–0.2)

## 2020-06-17 LAB — TROPONIN I (HIGH SENSITIVITY): Troponin I (High Sensitivity): 11 ng/L (ref <18)

## 2020-06-17 LAB — GLUCOSE, CAPILLARY
Glucose-Capillary: 223 mg/dL — ABNORMAL HIGH (ref 70–99)
Glucose-Capillary: 210 mg/dL — ABNORMAL HIGH (ref 70–99)

## 2020-06-17 LAB — D-DIMER, QUANTITATIVE: D-Dimer, Quant: 1.5 ug/mL-FEU — ABNORMAL HIGH (ref 0.00–0.50)

## 2020-06-17 LAB — C-REACTIVE PROTEIN: CRP: 1.7 mg/dL — ABNORMAL HIGH (ref <1.0)

## 2020-06-17 MED ORDER — CALCIUM CARBONATE ANTACID 500 MG PO CHEW
1.0000 | CHEWABLE_TABLET | Freq: Three times a day (TID) | ORAL | Status: DC | PRN
  Administered 2020-06-17: 06:00:00 200 mg via ORAL
  Filled 2020-06-17: qty 1

## 2020-06-17 MED ORDER — LISINOPRIL 40 MG PO TABS: 40.0000 mg | ORAL_TABLET | Freq: Every day | ORAL | 0 refills | Status: AC

## 2020-06-17 NOTE — Progress Notes (Signed)
OT Cancellation Note  Patient Details Name: Joel Stanley MRN: [id number] DOB: [birth year]   Cancelled Treatment:    Reason Eval/Treat Not Completed: OT screened, no needs identified, will sign off. Per chart review and conversation with pt's nurse, pt appears to be at/close to baseline with basic ADLs. Walked 200' with PT yesterday at supervision level and on RA. Pt is for d/c home today. Will screen out.  Raynald Kemp, OT Acute Rehabilitation Services Pager: [phone] Office: [phone]  06/17/2020, 11:22 AM

## 2020-06-17 NOTE — Progress Notes (Signed)
Patient c/o heartburn. Offered Maalox but patient refused. States it makes him more sick. Covering MD notified.

## 2020-06-17 NOTE — Plan of Care (Signed)

## 2020-06-17 NOTE — Discharge Summary (Signed)
Physician Discharge Summary  NIVEK POWLEY TDH:[id number] DOB: 1941-03-05 DOA: 06/14/2020  [facility]  Admit date: 06/14/2020 Discharge date: 06/17/2020  Admitted From: Home Disposition:  Home  Recommendations for Outpatient Follow-up:  1. Follow up with PCP in 1-2 weeks 2. Please obtain BMP/CBC in one week 3. Please follow up on the following pending results:  Home Health:None  Equipment/Devices:None  Discharge Condition: Stable  CODE STATUS: Full  Diet recommendation: Diabetic diet    Brief/Interim Summary: Joel Stanley is a 79 y.o. male with a history of CAD s/p PCI x6, stage IIIa CKD, T2DM, HTN, HLD, and covid-19 diagnosed at the [facility] 12/23 who presented later that day to the ED due to profound fatigue and weakness as well as cough and dyspnea. Work up revealed a left basilar opacity, CRP elevated at 4.5, troponin negative and ECG without ST elevations. He remained too weak to be safely discharged, so admission was requested. He remains severely weak requiring a bed pan for toileting, and has developed severe substernal chest pain. Remdesivir and steroids have been started for suspected early covid pneumonia and steroid-induced hyperglycemia is requiring insulin for management.  Patient's respiratory status, confusion, and weakness dramatically improved over the past 48h with steroid and remdesivir. Now ambulating without hypoxia or symptoms - eager and requesting discharge home which is certainly reasonable at this point given resolution of symptoms.  Discharge Diagnoses:  Principal Problem:   Pneumonia due to COVID-19 virus Active Problems:   CAD (coronary artery disease)   Hypertension associated with diabetes (HCC)   Type 2 diabetes mellitus (HCC)   CKD (chronic kidney disease), stage III (HCC)   Hyperlipidemia associated with type 2 diabetes mellitus (HCC)   Generalized weakness    Discharge Instructions  Discharge Instructions    Diet -  low sodium heart healthy   Complete by: As directed    Discharge instructions   Complete by: As directed    ?   Person Under Monitoring Name: Joel Stanley  Location: [address] [place] [address]   Infection Prevention Recommendations for Individuals Confirmed to have, or Being Evaluated for, 2019 Novel Coronavirus (COVID-19) Infection Who Receive Care at Home  Individuals who are confirmed to have, or are being evaluated for, COVID-19 should follow the prevention steps below until a healthcare provider or local or state health department says they can return to normal activities.  Stay home except to get medical care You should restrict activities outside your home, except for getting medical care. Do not go to work, school, or public areas, and do not use public transportation or taxis.  Call ahead before visiting your doctor Before your medical appointment, call the healthcare provider and tell them that you have, or are being evaluated for, COVID-19 infection. This will help the healthcare provider's office take steps to keep other people from getting infected. Ask your healthcare provider to call the local or state health department.  Monitor your symptoms Seek prompt medical attention if your illness is worsening (e.g., difficulty breathing). Before going to your medical appointment, call the healthcare provider and tell them that you have, or are being evaluated for, COVID-19 infection. Ask your healthcare provider to call the local or state health department.  Wear a facemask You should wear a facemask that covers your nose and mouth when you are in the same room with other people and when you visit a healthcare provider. People who live with or visit you should also wear a facemask  while they are in the same room with you.  Separate yourself from other people in your home As much as possible, you should stay in a different room from other people in  your home. Also, you should use a separate bathroom, if available.  Avoid sharing household items You should not share dishes, drinking glasses, cups, eating utensils, towels, bedding, or other items with other people in your home. After using these items, you should wash them thoroughly with soap and water.  Cover your coughs and sneezes Cover your mouth and nose with a tissue when you cough or sneeze, or you can cough or sneeze into your sleeve. Throw used tissues in a lined trash can, and immediately wash your hands with soap and water for at least 20 seconds or use an alcohol-based hand rub.  Wash your [employer] your hands often and thoroughly with soap and water for at least 20 seconds. You can use an alcohol-based hand sanitizer if soap and water are not available and if your hands are not visibly dirty. Avoid touching your eyes, nose, and mouth with unwashed hands.   Prevention Steps for Caregivers and Household Members of Individuals Confirmed to have, or Being Evaluated for, COVID-19 Infection Being Cared for in the Home  If you live with, or provide care at home for, a person confirmed to have, or being evaluated for, COVID-19 infection please follow these guidelines to prevent infection:  Follow healthcare provider's instructions Make sure that you understand and can help the patient follow any healthcare provider instructions for all care.  Provide for the patient's basic needs You should help the patient with basic needs in the home and provide support for getting groceries, prescriptions, and other personal needs.  Monitor the patient's symptoms If they are getting sicker, call his or her medical provider and tell them that the patient has, or is being evaluated for, COVID-19 infection. This will help the healthcare provider's office take steps to keep other people from getting infected. Ask the healthcare provider to call the local or state health  department.  Limit the number of people who have contact with the patient If possible, have only one caregiver for the patient. Other household members should stay in another home or place of residence. If this is not possible, they should stay in another room, or be separated from the patient as much as possible. Use a separate bathroom, if available. Restrict visitors who do not have an essential need to be in the home.  Keep older adults, very young children, and other sick people away from the patient Keep older adults, very young children, and those who have compromised immune systems or chronic health conditions away from the patient. This includes people with chronic heart, lung, or kidney conditions, diabetes, and cancer.  Ensure good ventilation Make sure that shared spaces in the home have good air flow, such as from an air conditioner or an opened window, weather permitting.  Wash your hands often Wash your hands often and thoroughly with soap and water for at least 20 seconds. You can use an alcohol based hand sanitizer if soap and water are not available and if your hands are not visibly dirty. Avoid touching your eyes, nose, and mouth with unwashed hands. Use disposable paper towels to dry your hands. If not available, use dedicated cloth towels and replace them when they become wet.  Wear a facemask and gloves Wear a disposable facemask at all times in the room and  gloves when you touch or have contact with the patient's blood, body fluids, and/or secretions or excretions, such as sweat, saliva, sputum, nasal mucus, vomit, urine, or feces.  Ensure the mask fits over your nose and mouth tightly, and do not touch it during use. Throw out disposable facemasks and gloves after using them. Do not reuse. Wash your hands immediately after removing your facemask and gloves. If your personal clothing becomes contaminated, carefully remove clothing and launder. Wash your hands after  handling contaminated clothing. Place all used disposable facemasks, gloves, and other waste in a lined container before disposing them with other household waste. Remove gloves and wash your hands immediately after handling these items.  Do not share dishes, glasses, or other household items with the patient Avoid sharing household items. You should not share dishes, drinking glasses, cups, eating utensils, towels, bedding, or other items with a patient who is confirmed to have, or being evaluated for, COVID-19 infection. After the person uses these items, you should wash them thoroughly with soap and water.  Wash laundry thoroughly Immediately remove and wash clothes or bedding that have blood, body fluids, and/or secretions or excretions, such as sweat, saliva, sputum, nasal mucus, vomit, urine, or feces, on them. Wear gloves when handling laundry from the patient. Read and follow directions on labels of laundry or clothing items and detergent. In general, wash and dry with the warmest temperatures recommended on the label.  Clean all areas the individual has used often Clean all touchable surfaces, such as counters, tabletops, doorknobs, bathroom fixtures, toilets, phones, keyboards, tablets, and bedside tables, every day. Also, clean any surfaces that may have blood, body fluids, and/or secretions or excretions on them. Wear gloves when cleaning surfaces the patient has come in contact with. Use a diluted bleach solution (e.g., dilute bleach with 1 part bleach and 10 parts water) or a household disinfectant with a label that says EPA-registered for coronaviruses. To make a bleach solution at home, add 1 tablespoon of bleach to 1 quart (4 cups) of water. For a larger supply, add  cup of bleach to 1 gallon (16 cups) of water. Read labels of cleaning products and follow recommendations provided on product labels. Labels contain instructions for safe and effective use of the cleaning product  including precautions you should take when applying the product, such as wearing gloves or eye protection and making sure you have good ventilation during use of the product. Remove gloves and wash hands immediately after cleaning.  Monitor yourself for signs and symptoms of illness Caregivers and household members are considered close contacts, should monitor their health, and will be asked to limit movement outside of the home to the extent possible. Follow the monitoring steps for close contacts listed on the symptom monitoring form.   ? If you have additional questions, contact your local health department or call the epidemiologist on call at [phone] (available 24/7). ? This guidance is subject to change. For the most up-to-date guidance from [facility], please refer to their website: TripMetro.hu   Increase activity slowly   Complete by: As directed      Allergies as of 06/17/2020   No Known Allergies     Medication List    TAKE these medications   acetaminophen 650 MG CR tablet Commonly known as: TYLENOL Take 650 mg by mouth 2 (two) times daily.   aspirin 81 MG chewable tablet Chew 1 tablet (81 mg total) by mouth daily.   clopidogrel 75 MG tablet Commonly known  as: PLAVIX Take 75 mg by mouth daily.   lisinopril 40 MG tablet Commonly known as: ZESTRIL Take 1 tablet (40 mg total) by mouth daily. What changed: when to take this   metFORMIN 500 MG tablet Commonly known as: GLUCOPHAGE Take 1 tablet (500 mg total) by mouth 2 (two) times daily with a meal.   metoprolol tartrate 100 MG tablet Commonly known as: LOPRESSOR Take 50 mg by mouth 2 (two) times daily. Taking 1/2 tablet twice a day   nitroGLYCERIN 0.4 MG SL tablet Commonly known as: NITROSTAT Place 1 tablet (0.4 mg total) under the tongue every 5 (five) minutes x 3 doses as needed for chest pain.   rosuvastatin 10 MG tablet Commonly known as:  CRESTOR Take 10 mg by mouth every evening.   Vitamin D-3 25 MCG (1000 UT) Caps Take 2,000 Units by mouth daily.       No Known Allergies  Consultations:  None3   Procedures/Studies: CT Head Wo Contrast  Result Date: 06/14/2020 CLINICAL DATA:  Mental status change, unknown cause. Additional history provided: COVID positive. EXAM: CT HEAD WITHOUT CONTRAST TECHNIQUE: Contiguous axial images were obtained from the base of the skull through the vertex without intravenous contrast. COMPARISON:  No pertinent prior exams available for comparison. FINDINGS: Brain: Mild cerebral and cerebellar atrophy. Ill-defined hypoattenuation within the cerebral white matter is nonspecific, but compatible with chronic small vessel ischemic disease. There is no acute intracranial hemorrhage. No demarcated cortical infarct. No extra-axial fluid collection. No evidence of intracranial mass. No midline shift. Vascular: No hyperdense vessel.  Atherosclerotic calcifications. Skull: Normal. Negative for fracture or focal suspicious osseous lesion. Sinuses/Orbits: Visualized orbits show no acute finding. Ethmoid sinus mucosal thickening (moderate/severe right, moderate left). Mild mucosal thickening elsewhere within the paranasal sinuses. IMPRESSION: No evidence of acute intracranial abnormality. Mild generalized parenchymal atrophy with chronic small vessel ischemic disease. Paranasal sinus mucosal thickening, most notably ethmoidal. Electronically Signed   By: Jackey Loge DO   On: 06/14/2020 18:52   DG Chest Port 1 View  Result Date: 06/14/2020 CLINICAL DATA:  Cough, COVID positive. EXAM: PORTABLE CHEST 1 VIEW COMPARISON:  12/13/2017 FINDINGS: Mild left basilar opacity, suspicious for pneumonia in this patient with known COVID. Right lung is clear. No pleural effusion or pneumothorax. The heart is top-normal in size. Degenerative changes of the thoracic spine. IMPRESSION: Mild left basilar opacity, suspicious for  pneumonia in this patient with known COVID. Electronically Signed   By: Charline Bills M.D.   On: 06/14/2020 16:58   US Abdomen Limited RUQ (LIVER/GB)  Result Date: 06/15/2020 CLINICAL DATA:  Elevated bilirubin with right upper quadrant pain EXAM: ULTRASOUND ABDOMEN LIMITED RIGHT UPPER QUADRANT COMPARISON:  04/13/2008 FINDINGS: Gallbladder: Surgically removed. Common bile duct: Diameter: 6.4 mm Liver: Increased in echogenicity consistent with fatty infiltration. Portal vein is patent on color Doppler imaging with normal direction of blood flow towards the liver. Other: Incidental note is made of a 2.6 cm upper pole right renal cyst. IMPRESSION: Fatty liver. Status post cholecystectomy. Incidental note is made of right renal cyst. Electronically Signed   By: Alcide Clever M.D.   On: 06/15/2020 15:48     Subjective: No acute issues/events overnight   Discharge Exam: Vitals:   06/16/20 2035 06/17/20 0505  BP: 136/83 (!) 152/82  Pulse: 82 65  Resp: 20 16  Temp: 99.1 F (37.3 C) 99 F (37.2 C)  SpO2: 95% 92%   Vitals:   06/16/20 0407 06/16/20 1412 06/16/20 2035 06/17/20 0505  BP: (!) 155/91 119/75 136/83 (!) 152/82  Pulse: 71 67 82 65  Resp: Temp: 98.5 F (36.9 C) 98.1 F (36.7 C) 99.1 F (37.3 C) 99 F (37.2 C)  TempSrc: Oral  Oral Oral  SpO2: 96% 93% 95% 92%  Weight:      Height:        General: Pt is alert, awake, not in acute distress Cardiovascular: RRR, S1/S2 +, no rubs, no gallops Respiratory: CTA bilaterally, no wheezing, no rhonchi Abdominal: Soft, NT, ND, bowel sounds + Extremities: no edema, no cyanosis    The results of significant diagnostics from this hospitalization (including imaging, microbiology, ancillary and laboratory) are listed below for reference.     Microbiology: Recent Results (from the past 240 hour(s))  Resp Panel by RT-PCR (Flu A&B, Covid) Nasopharyngeal Swab     Status: Abnormal   Collection Time: 06/14/20  4:34 PM    Specimen: Nasopharyngeal Swab; Nasopharyngeal(NP) swabs in vial transport medium  Result Value Ref Range Status   SARS Coronavirus 2 by RT PCR POSITIVE (A) NEGATIVE Final    Comment: RESULT CALLED TO, READ BACK BY AND VERIFIED WITH: DOSTER,S. RN  06/14/20 BILLINGSLEY,L    Influenza A by PCR NEGATIVE NEGATIVE Final   Influenza B by PCR NEGATIVE NEGATIVE Final    Comment: Performed at [facility], [address] [place], [place] [address]  Blood Culture (routine x 2)     Status: None (Preliminary result)   Collection Time: 06/14/20  5:51 PM   Specimen: BLOOD  Result Value Ref Range Status   Specimen Description   Final    BLOOD LEFT ANTECUBITAL Performed at [facility], [address], [place], [place] [address]    Special Requests   Final    BOTTLES DRAWN AEROBIC AND ANAEROBIC Blood Culture adequate volume Performed at [facility], [address], [place], [place] [address]    Culture   Final    NO GROWTH 3 DAYS Performed at [facility], [address] [place], [place] [address]    Report Status PENDING  Incomplete  Blood Culture (routine x 2)     Status: None (Preliminary result)   Collection Time: 06/14/20  5:51 PM   Specimen: BLOOD  Result Value Ref Range Status   Specimen Description   Final    BLOOD BLOOD LEFT FOREARM Performed at [facility], [address] [place], [place] [address]    Special Requests   Final    BOTTLES DRAWN AEROBIC AND ANAEROBIC Blood Culture adequate volume Performed at [facility], [address] [place], [place] [address]    Culture   Final    NO GROWTH 3 DAYS Performed at [facility], [address], [place], [place] [address]    Report Status PENDING  Incomplete     Labs: BNP (last 3 results) No results for input(s): BNP in the last 8760 hours. Basic Metabolic Panel: Recent Labs  Lab 06/14/20 1634 06/15/20 0148  06/15/20 0309 06/16/20 0341 06/17/20 0310  NA 136  --  135 136 135  K 3.9  --  3.9 4.6 4.3  CL 99  --  96* 98 99  CO2 24  --  GLUCOSE 141*  --  200* 191* 162*  BUN 19  --  19 25* 34*  CREATININE 1.30*  --  1.16 1.22 1.23  CALCIUM 9.3  --  9.1 8.8* 8.5*  MG  --  1.8 1.8  --   --   PHOS  --   --  3.9  --   --    Liver Function Tests: Recent Labs  Lab 06/14/20 1634 06/15/20 0309 06/16/20 0341 06/17/20 0310  AST 16 16 20 23   ALT 19 19 20 25   ALKPHOS 41 40 40 42  BILITOT 1.2 1.4* 0.7 0.6  PROT 7.4 7.7 7.3 6.8  ALBUMIN 4.4 4.4 4.1 3.8   Recent Labs  Lab 06/15/20 0918  LIPASE 51   No results for input(s): AMMONIA in the last 168 hours. CBC: Recent Labs  Lab 06/14/20 1634 06/15/20 0309 06/16/20 0341 06/17/20 0310  WBC 6.0 6.7 10.0 9.1  NEUTROABS 4.4 5.3 8.6* 7.2  HGB 15.2 15.8 16.0 15.3  HCT 45.4 46.6 47.5 46.7  MCV 91.5 90.7 90.8 92.1  PLT 219 233 284 289   Cardiac Enzymes: No results for input(s): CKTOTAL, CKMB, CKMBINDEX, TROPONINI in the last 168 hours. BNP: Invalid input(s): POCBNP CBG: Recent Labs  Lab 06/16/20 1129 06/16/20 1709 06/16/20 2037 06/17/20 0757 06/17/20 1115  GLUCAP 158* 169* 243* 223* 210*   D-Dimer Recent Labs    06/16/20 0341 06/17/20 0310  DDIMER 1.02* 1.50*   Hgb A1c Recent Labs    06/15/20 0309  HGBA1C 6.6*   Lipid Profile No results for input(s): CHOL, HDL, LDLCALC, TRIG, CHOLHDL, LDLDIRECT in the last 72 hours. Thyroid function studies No results for input(s): TSH, T4TOTAL, T3FREE, THYROIDAB in the last 72 hours.  Invalid input(s): FREET3 Anemia work up Recent Labs    06/15/20 0309  FERRITIN 121   Urinalysis    Component Value Date/Time   COLORURINE YELLOW 04/30/2008 2316   APPEARANCEUR CLEAR 04/30/2008 2316   LABSPEC 1.019 04/30/2008 2316   PHURINE 6.5 04/30/2008 2316   GLUCOSEU NEGATIVE 04/30/2008 2316   HGBUR NEGATIVE 04/30/2008 2316   BILIRUBINUR NEGATIVE 04/30/2008 2316   KETONESUR  NEGATIVE 04/30/2008 2316   PROTEINUR NEGATIVE 04/30/2008 2316   UROBILINOGEN 1.0 04/30/2008 2316   NITRITE NEGATIVE 04/30/2008 2316   LEUKOCYTESUR  04/30/2008 2316    NEGATIVE MICROSCOPIC NOT DONE ON URINES WITH NEGATIVE PROTEIN, BLOOD, LEUKOCYTES, NITRITE, OR GLUCOSE <1000 mg/dL.   Sepsis Labs Invalid input(s): PROCALCITONIN,  WBC,  LACTICIDVEN Microbiology Recent Results (from the past 240 hour(s))  Resp Panel by RT-PCR (Flu A&B, Covid) Nasopharyngeal Swab     Status: Abnormal   Collection Time: 06/14/20  4:34 PM   Specimen: Nasopharyngeal Swab; Nasopharyngeal(NP) swabs in vial transport medium  Result Value Ref Range Status   SARS Coronavirus 2 by RT PCR POSITIVE (A) NEGATIVE Final    Comment: RESULT CALLED TO, READ BACK BY AND VERIFIED WITH: DOSTER,S. RN @2026  06/14/20 BILLINGSLEY,L    Influenza A by PCR NEGATIVE NEGATIVE Final   Influenza B by PCR NEGATIVE NEGATIVE Final    Comment: Performed at [facility], [address] [place], [place] [address]  Blood Culture (routine x 2)     Status: None (Preliminary result)   Collection Time: 06/14/20  5:51 PM   Specimen: BLOOD  Result Value Ref Range Status   Specimen Description   Final    BLOOD LEFT ANTECUBITAL Performed at [facility], [address] [place], [place] [address]    Special Requests   Final    BOTTLES DRAWN AEROBIC AND ANAEROBIC Blood Culture adequate volume Performed at [facility], [address] [place], [place] [address]  Culture   Final    NO GROWTH 3 DAYS Performed at [facility], [address], [place], [place] [address]    Report Status PENDING  Incomplete  Blood Culture (routine x 2)     Status: None (Preliminary result)   Collection Time: 06/14/20  5:51 PM   Specimen: BLOOD  Result Value Ref Range Status   Specimen Description   Final    BLOOD BLOOD LEFT FOREARM Performed at [facility] [address], [place], [place] [address]    Special Requests   Final    BOTTLES DRAWN AEROBIC AND ANAEROBIC Blood Culture adequate volume Performed at [facility], [address], [place], [place] [address]    Culture   Final    NO GROWTH 3 DAYS Performed at [facility], [address] [place], [place] [address]    Report Status PENDING  Incomplete     Time coordinating discharge: Over 30 minutes  SIGNED:   Azucena Fallen, DO [facility] 06/17/2020, 5:25 PM Pager   If 7PM-7AM, please contact night-coverage [email or web address]

## 2020-06-17 NOTE — Progress Notes (Signed)
Discharge instructions given with stated understanding.  Patient waiting for transportation home at this time 

## 2020-06-19 LAB — CULTURE, BLOOD (ROUTINE X 2)
Culture: NO GROWTH
Culture: NO GROWTH
Special Requests: ADEQUATE
Special Requests: ADEQUATE

## 2020-08-27 ENCOUNTER — Other Ambulatory Visit: Payer: Self-pay

## 2020-08-27 ENCOUNTER — Ambulatory Visit (INDEPENDENT_AMBULATORY_CARE_PROVIDER_SITE_OTHER): Payer: No Typology Code available for payment source | Admitting: Neurology

## 2020-08-27 DIAGNOSIS — R29898 Other symptoms and signs involving the musculoskeletal system: Secondary | ICD-10-CM

## 2020-08-27 DIAGNOSIS — Z0289 Encounter for other administrative examinations: Secondary | ICD-10-CM

## 2020-09-21 DIAGNOSIS — R29898 Other symptoms and signs involving the musculoskeletal system: Secondary | ICD-10-CM | POA: Insufficient documentation

## 2020-09-21 NOTE — Progress Notes (Signed)
[facility] report is under procedure tab. 

## 2020-09-21 NOTE — Procedures (Signed)
Full Name: Joel Stanley Gender: Male MRN #: [id number] Date of Birth: 02-28-1941    Visit Date: 08/27/2020 08:52 Age: 80 Years Examining Physician: Levert Feinstein, MD  Referring Physician: Zenda Alpers, MD History:  80 year old male with right leg weakness.  Summary of tests: Nerve Conduction Study: Bilateral Sural and superficial peroneal sensory responses were normal.  Bilateral tibial, peroneal to EDB and right ulnar motor responses were normal.   Electromyography: Selected needle examination of bilateral lower extremity muscles and bilateral lumbosacral paraspinal muscles were normal.  Conclusion: This is a normal study.  There is no electrodiagnostic evidence of large fiber peripheral neuropathy or bilateral lumbosacral radiculopathy.    ------------------------------- Levert Feinstein, M.D. PhD  [facility] [address] [place], [place] [address] Tel: [phone] Fax: [phone]  Verbal informed consent was obtained from the patient, patient was informed of potential risk of procedure, including bruising, bleeding, hematoma formation, infection, muscle weakness, muscle pain, numbness, among others.        MNC    Nerve / Sites Muscle Latency Ref. Amplitude Ref. Rel Amp Segments Distance Velocity Ref. Area    ms ms mV mV %  cm m/s m/s mVms  R Ulnar - ADM     Wrist ADM 3.2 ?3.3 8.6 ?6.0 100 Wrist - ADM 7   25.9     B.Elbow ADM 7.5  6.5  75.6 B.Elbow - Wrist 22 51 ?49 20.9     A.Elbow ADM 9.5  7.4  114 A.Elbow - B.Elbow 10 51 ?49 24.5  R Peroneal - EDB     Ankle EDB 5.1 ?6.5 4.9 ?2.0 100 Ankle - EDB 9   18.0     Fib head EDB 12.9  4.0  82.7 Fib head - Ankle 27 35 ?44 15.6     Pop fossa EDB 15.8  3.9  97.7 Pop fossa - Fib head 10 34 ?44 17.1         Pop fossa - Ankle      L Peroneal - EDB     Ankle EDB 6.1 ?6.5 5.0 ?2.0 100 Ankle - EDB 9   21.7     Fib head EDB 14.6  4.7  93.4 Fib head - Ankle 28 33 ?44 19.8     Pop fossa EDB 17.6   4.3  92.8 Pop fossa - Fib head 10 33 ?44 19.1         Pop fossa - Ankle      R Tibial - AH     Ankle AH 3.7 ?5.8 3.3 ?4.0 100 Ankle - AH 9   11.9     Pop fossa AH 16.2  2.7  81.5 Pop fossa - Ankle 41 33 ?41 10.8  L Tibial - AH     Ankle AH 4.4 ?5.8 4.7 ?4.0 100 Ankle - AH 9   12.2     Pop fossa AH 16.0  2.2  46.5 Pop fossa - Ankle 40 34 ?41 13.0               SNC    Nerve / Sites Rec. Site Peak Lat Ref.  Amp Ref. Segments Distance    ms ms V V  cm  R Sural - Ankle (Calf)     Calf Ankle 4.1 ?4.4 9 ?6 Calf - Ankle 14  L Sural - Ankle (Calf)     Calf Ankle 3.9 ?4.4 6 ?6 Calf - Ankle 14  R  Superficial peroneal - Ankle     Lat leg Ankle 4.0 ?4.4 6 ?6 Lat leg - Ankle 14  L Superficial peroneal - Ankle     Lat leg Ankle 4.2 ?4.4 6 ?6 Lat leg - Ankle 14             F  Wave    Nerve F Lat Ref.   ms ms  R Tibial - AH 59.3 ?56.0  L Tibial - AH 60.2 ?56.0  R Ulnar - ADM 33.4 ?32.0           EMG Summary Table    Spontaneous MUAP Recruitment  Muscle IA Fib PSW Fasc Other Amp Dur. Poly Pattern  R. Tibialis anterior Normal None None None _______ Normal Normal Normal Normal  R. Tibialis posterior Normal None None None _______ Normal Normal Normal Normal  R. Peroneus longus Normal None None None _______ Normal Normal Normal Normal  R. Gastrocnemius (Medial head) Normal None None None _______ Normal Normal Normal Normal  R. Vastus lateralis Normal None None None _______ Normal Normal Normal Normal  L. Tibialis anterior Normal None None None _______ Normal Normal Normal Normal  L. Tibialis posterior Normal None None None _______ Normal Normal Normal Normal  L. Peroneus longus Normal None None None _______ Normal Normal Normal Normal  L. Vastus lateralis Normal None None None _______ Normal Normal Normal Normal  L. Gastrocnemius (Medial head) Normal None None None _______ Normal Normal Normal Normal  R. Lumbar paraspinals (low) Normal None None None _______ Normal Normal Normal Normal  R.  Lumbar paraspinals (mid) Normal None None None _______ Normal Normal Normal Normal  L. Lumbar paraspinals (low) Normal None None None _______ Normal Normal Normal Normal  L. Lumbar paraspinals (mid) Normal None None None _______ Normal Normal Normal Normal

## 2020-11-29 ENCOUNTER — Ambulatory Visit: Payer: Non-veteran care | Admitting: Neurology

## 2021-05-28 ENCOUNTER — Ambulatory Visit (INDEPENDENT_AMBULATORY_CARE_PROVIDER_SITE_OTHER): Payer: Medicare PPO | Admitting: Cardiovascular Disease

## 2021-05-28 ENCOUNTER — Encounter: Payer: Self-pay | Admitting: Cardiovascular Disease

## 2021-05-28 ENCOUNTER — Other Ambulatory Visit: Payer: Self-pay

## 2021-05-28 DIAGNOSIS — I2510 Atherosclerotic heart disease of native coronary artery without angina pectoris: Secondary | ICD-10-CM

## 2021-05-28 DIAGNOSIS — I152 Hypertension secondary to endocrine disorders: Secondary | ICD-10-CM

## 2021-05-28 DIAGNOSIS — E782 Mixed hyperlipidemia: Secondary | ICD-10-CM | POA: Diagnosis not present

## 2021-05-28 DIAGNOSIS — E1159 Type 2 diabetes mellitus with other circulatory complications: Secondary | ICD-10-CM | POA: Diagnosis not present

## 2021-05-28 NOTE — Assessment & Plan Note (Signed)
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  I intervened on him in the setting of unstable angina 07/13/2010 I reevaluated his PLA stent which had a 75% "in-stent restenosis and stented his PDA.  His last catheterization performed by myself 12/14/2017 via the right radial approach revealed a mid LAD occlusion which is old with left to left collaterals, patent stent in PDA and PLA branches.  He denies chest pain or shortness of breath.

## 2021-05-28 NOTE — Assessment & Plan Note (Signed)
History of essential hypertension a blood pressure measured today 132/72.  He is on hydrochlorothiazide, lisinopril and metoprolol.

## 2021-05-28 NOTE — Assessment & Plan Note (Addendum)
History of hyperlipidemia on statin therapy followed by the [facility].  His most recent lipid profile performed 01/24/2021 at the [facility] revealed a total cholesterol 100, LDL 35 and HDL 31.

## 2021-05-28 NOTE — Patient Instructions (Signed)

## 2021-05-28 NOTE — Progress Notes (Signed)
05/28/2021 [name]   [birth year]  [id number]  Primary Physician Administration, [facility] Primary Cardiologist: Lorretta Harp MD FACP, [place], [place], [place]  HPI:  Joel Stanley is a 80 y.o.  mildly to moderately overweight divorced Caucasian male father of 43, grandfather of 2 grandchildren whose care I assumed from Dr. Aldona Bar. I last saw him in the office 04/06/2020. He is currently retired. 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 His cardiac risk factor profile is positive for remote tobacco abuse, having quit 30 years ago, treated hypertension, hyperlipidemia and non-insulin-requiring diabetes. He does have a family history of heart disease with a father who died of an MI at age 74 and a brother who has CAD as well. He had his first MI in 1997 and multiple stents since that time, beginning in 2006 and 2009. I intervened on him in the setting of unstable angina on July 21, 2010 and re-dilated his PLA stent which had a 75% "in-stent" restenosis and stented his PDA. His EF at that time was 45% with moderate inferobasal hypokinesia. He does get dyspnea but denies chest pain. [facility] and [facility] Chalmers Cater follow his lab work.  He did have a Myoview stress test performed 09/21/13 which was low risk and nonischemic.    He was admitted to the hospital in June 2019 with chest pain.  He underwent diagnostic coronary angiography by myself on 12/14/2017 via the right radial approach revealing an occluded mid LAD which was old with left to left collaterals, patent stents in the PDA and PLA branches.  EF is 45%.     Since I saw him a year ago he continues to do well.  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  His blood work is followed at the [facility].  He unfortunately contracted COVID in December of last year was hospitalized for 3 days.  This is left him with some neurologic residual with occasional instability and falls.  Current Meds  Medication Sig    acetaminophen (TYLENOL) 650 MG CR tablet Take 650 mg by mouth 2 (two) times daily.   aspirin 81 MG chewable tablet Chew 1 tablet (81 mg total) by mouth daily.   Cholecalciferol (VITAMIN D-3) 1000 UNITS CAPS Take 2,000 Units by mouth daily.   clopidogrel (PLAVIX) 75 MG tablet Take 75 mg by mouth daily.   EPINEPHrine 0.3 mg/0.3 mL IJ SOAJ injection INJECT 0.3 ML INTRAMUSCULARLY AS DIRECTED FOR ANAPHYLACTIC REACTION   famotidine (PEPCID) 20 MG tablet Take 1 tablet by mouth 2 (two) times daily.   hydrochlorothiazide (HYDRODIURIL) 25 MG tablet Take 25 mg by mouth daily.   lisinopril (ZESTRIL) 40 MG tablet Take 1 tablet (40 mg total) by mouth daily.   metFORMIN (GLUCOPHAGE) 500 MG tablet Take 1 tablet (500 mg total) by mouth 2 (two) times daily with a meal.   metoprolol (LOPRESSOR) 100 MG tablet Take 50 mg by mouth 2 (two) times daily. Taking 1/2 tablet twice a day   nitroGLYCERIN (NITROSTAT) 0.4 MG SL tablet Place 1 tablet (0.4 mg total) under the tongue every 5 (five) minutes x 3 doses as needed for chest pain.   polyvinyl alcohol (ARTIFICIAL TEARS) 1.4 % ophthalmic solution 1 drop as needed for dry eyes.   rosuvastatin (CRESTOR) 10 MG tablet Take 10 mg by mouth every evening.     Allergies  Allergen Reactions   Yellow Jacket Venom     Social History  Socioeconomic History   Marital status: Widowed    Spouse name: Not on file   Number of children: Not on file   Years of education: Not on file   Highest education level: Not on file  Occupational History   Not on file  Tobacco Use   Smoking status: Former    Types: Cigarettes    Quit date: 03/02/1973    Years since quitting: 48.2   Smokeless tobacco: Never  Vaping Use   Vaping Use: Never used  Substance and Sexual Activity   Alcohol use: Yes    Comment: social    Drug use: Never   Sexual activity: Not on file  Other Topics Concern   Not on file  Social History Narrative   Not on file   Social Determinants of Health    Financial Resource Strain: Not on file  Food Insecurity: Not on file  Transportation Needs: Not on file  Physical Activity: Not on file  Stress: Not on file  Social Connections: Not on file  Intimate Partner Violence: Not on file     Review of Systems: General: negative for chills, fever, night sweats or weight changes.  Cardiovascular: negative for chest pain, dyspnea on exertion, edema, orthopnea, palpitations, paroxysmal nocturnal dyspnea or shortness of breath Dermatological: negative for rash Respiratory: negative for cough or wheezing Urologic: negative for hematuria Abdominal: negative for nausea, vomiting, diarrhea, bright red blood per rectum, melena, or hematemesis Neurologic: negative for visual changes, syncope, or dizziness All other systems reviewed and are otherwise negative except as noted above.    Blood pressure 132/72, pulse (!) 49, height 5\' 11"  (1.803 m), weight 207 lb (93.9 kg), SpO2 98 %.  General appearance: alert and no distress Neck: no adenopathy, no carotid bruit, no JVD, supple, symmetrical, trachea midline, and thyroid not enlarged, symmetric, no tenderness/mass/nodules Lungs: clear to auscultation bilaterally Heart: regular rate and rhythm, S1, S2 normal, no murmur, click, rub or gallop Extremities: extremities normal, atraumatic, no cyanosis or edema Pulses: 2+ and symmetric Skin: Skin color, texture, turgor normal. No rashes or lesions Neurologic: Grossly normal  EKG sinus bradycardia 59 with inferior Q waves and early R wave transition.  I personally reviewed this EKG.  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  I intervened on him in the setting of unstable angina 07/13/2010 I reevaluated his PLA stent which had a 75% "in-stent restenosis and stented his PDA.  His last catheterization performed by myself 12/14/2017 via the right radial approach revealed a mid LAD  occlusion which is old with left to left collaterals, patent stent in PDA and PLA branches.  He denies chest pain or shortness of breath.  Hypertension associated with diabetes (HCC) History of essential hypertension a blood pressure measured today 132/72.  He is on hydrochlorothiazide, lisinopril and metoprolol.  Hyperlipidemia History of hyperlipidemia on statin therapy followed by the [facility].     [facility] MD FACP,FACC,FAHA, [facility] 05/28/2021 8:58 AM

## 2021-05-31 IMAGING — DX DG CHEST 1V PORT
1 series · 1 of 1 positions shown · non-contrast
Comparison: 12/13/2017

CLINICAL DATA: Cough, COVID positive.

EXAM:
PORTABLE CHEST 1 VIEW

[chest ap]
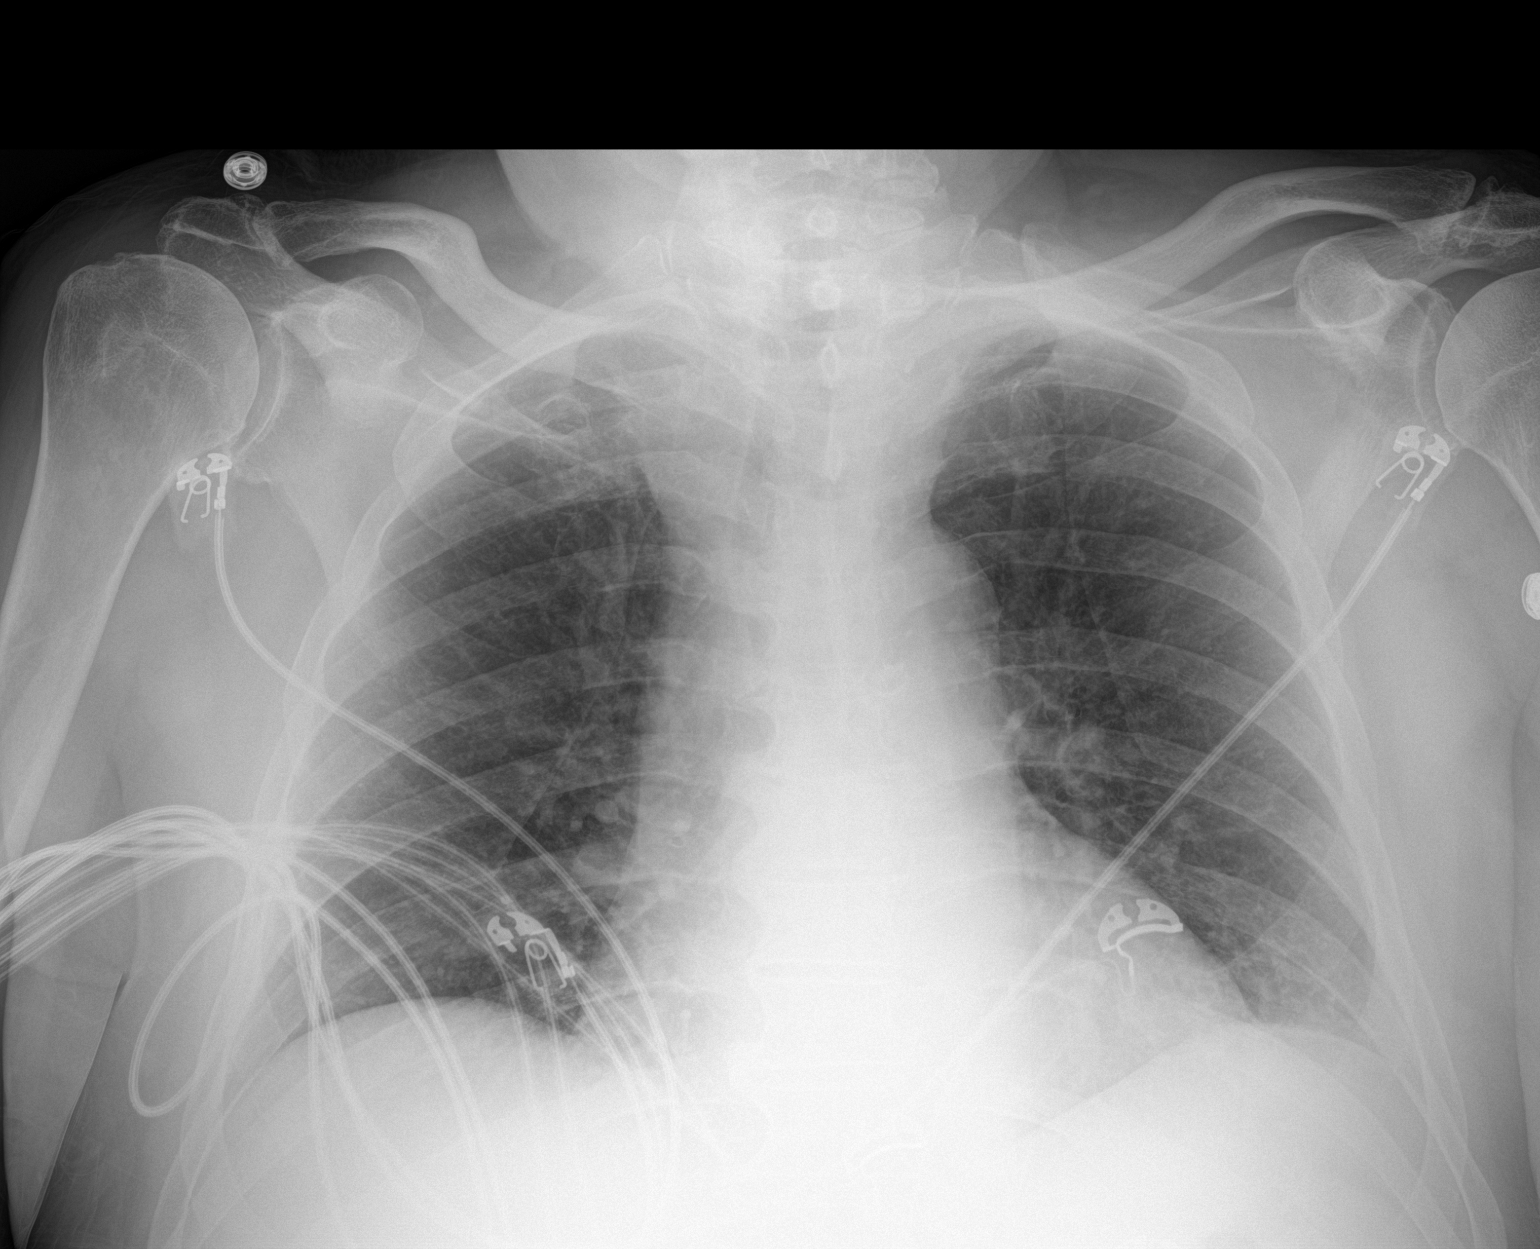

[1 of 1 positions shown; findings below may reference images not displayed]

FINDINGS: Mild left basilar opacity, suspicious for pneumonia in this patient
with known COVID. Right lung is clear. No pleural effusion or
pneumothorax.

The heart is top-normal in size.

Degenerative changes of the thoracic spine.
IMPRESSION: Mild left basilar opacity, suspicious for pneumonia in this patient
with known COVID.

## 2021-06-01 IMAGING — US US ABDOMEN LIMITED
1 series · 14 of 25 positions shown · non-contrast
Comparison: 04/13/2008

CLINICAL DATA: Elevated bilirubin with right upper quadrant pain

EXAM:
ULTRASOUND ABDOMEN LIMITED RIGHT UPPER QUADRANT

[Series 1: us abdomen limited · 14 of 49 slices shown]
[im 1/49]
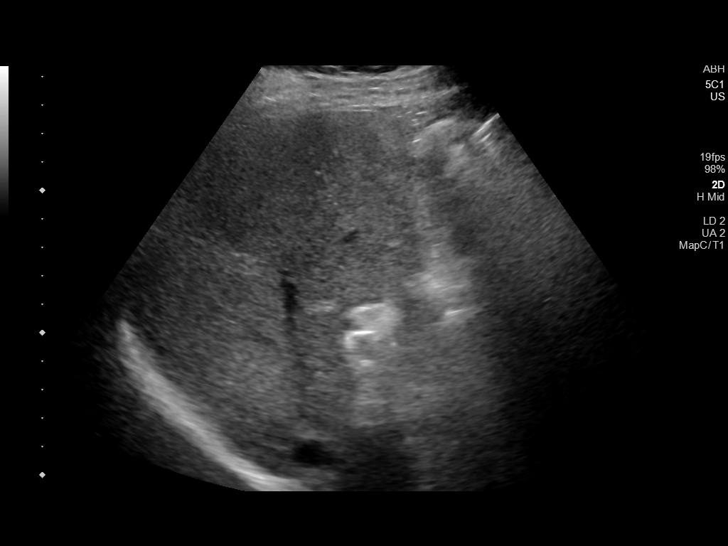
[im 5/49]
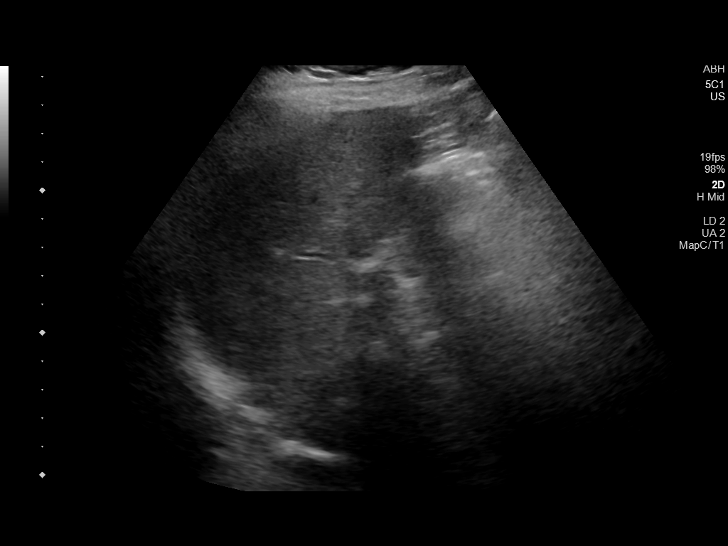
[im 9/49]
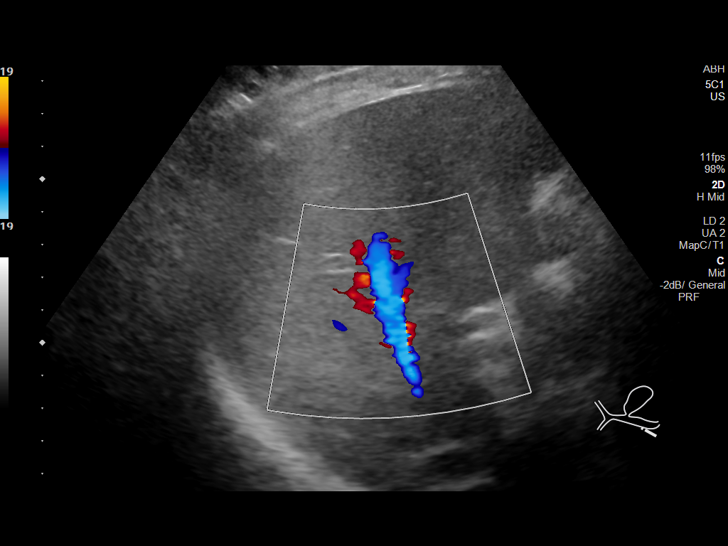
[im 13/49]
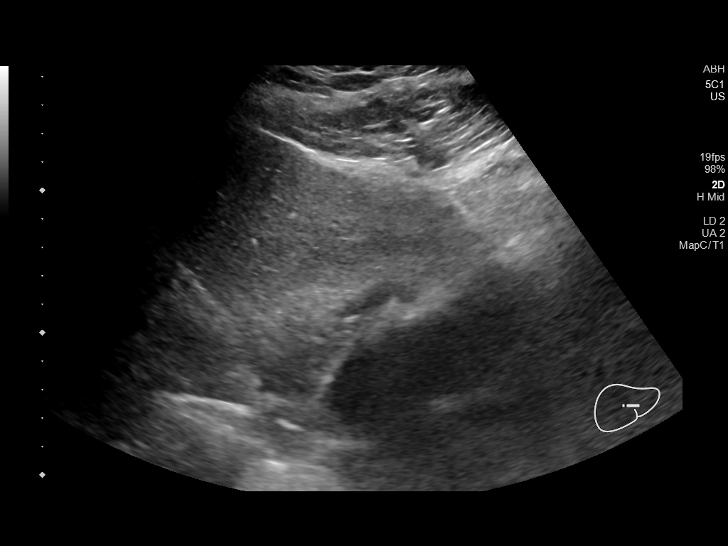
[im 17/49]
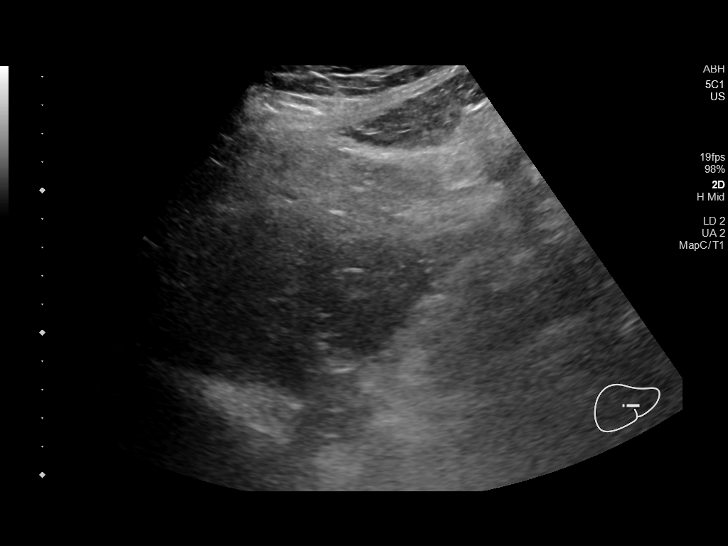
[im 19/49]
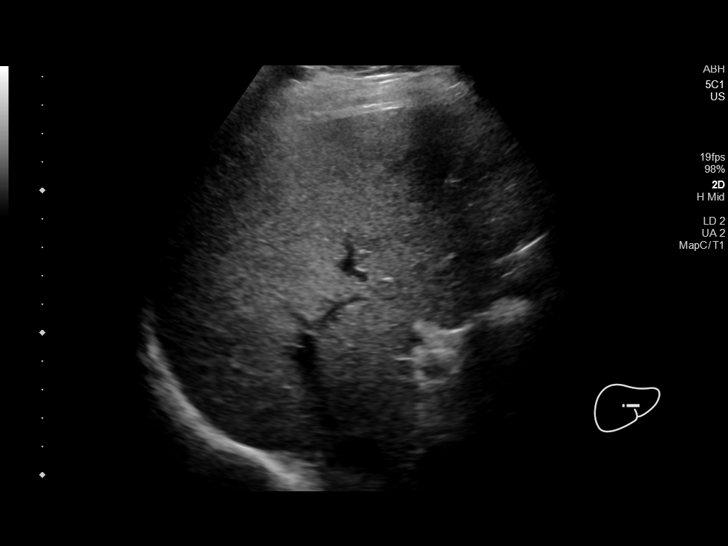
[im 23/49]
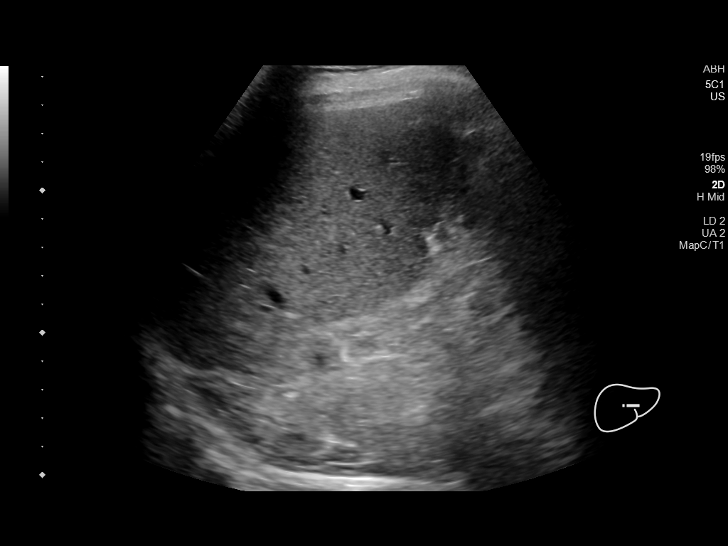
[im 27/49]
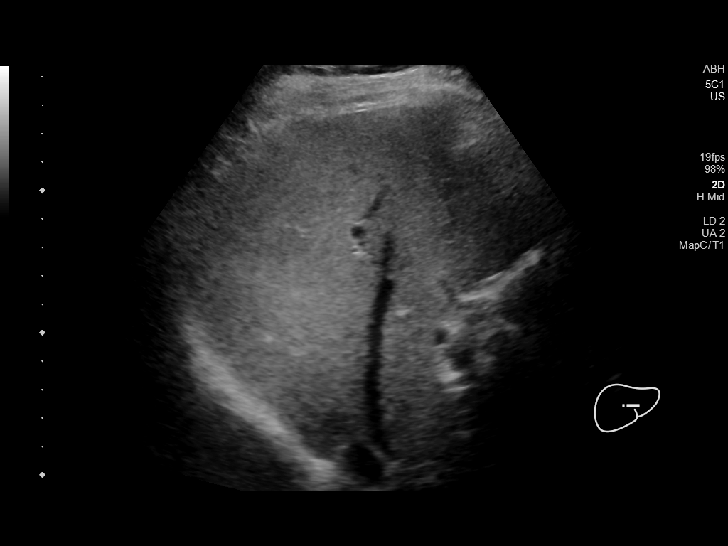
[im 31/49]
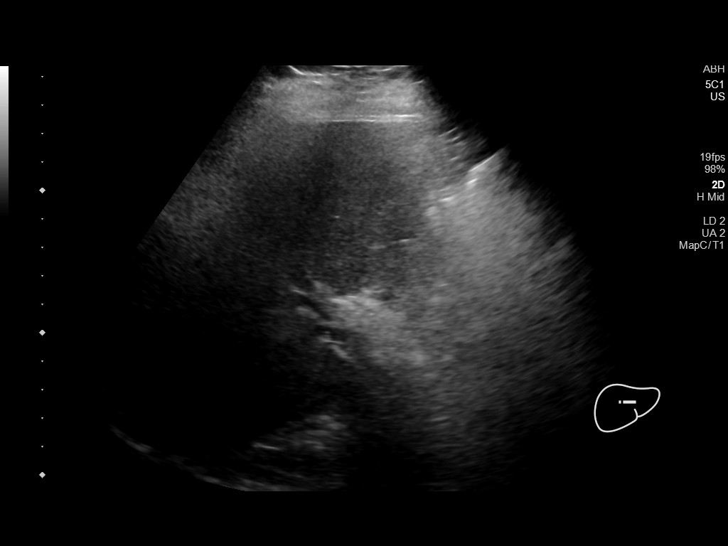
[im 33/49]
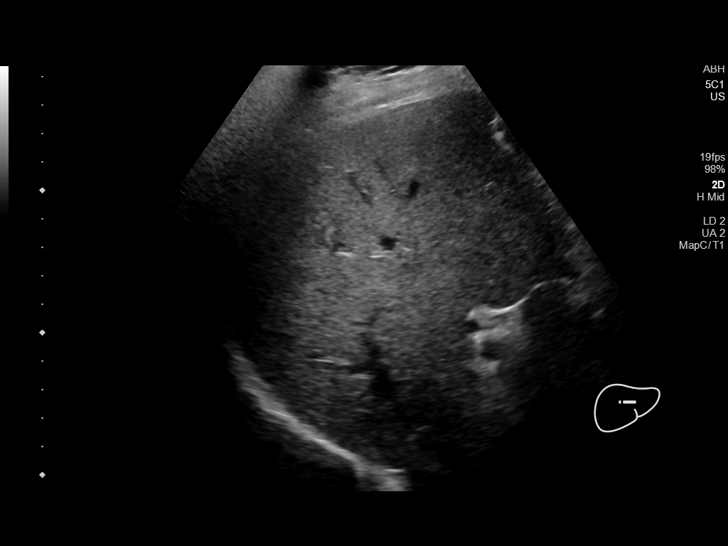
[im 37/49]
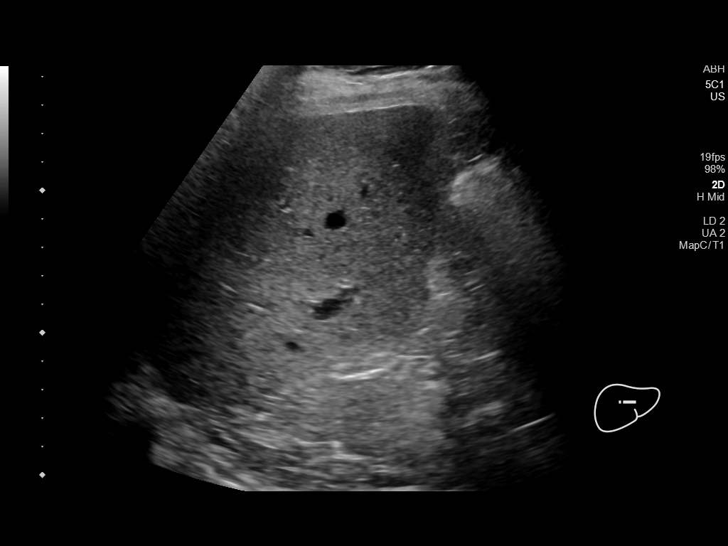
[im 41/49]
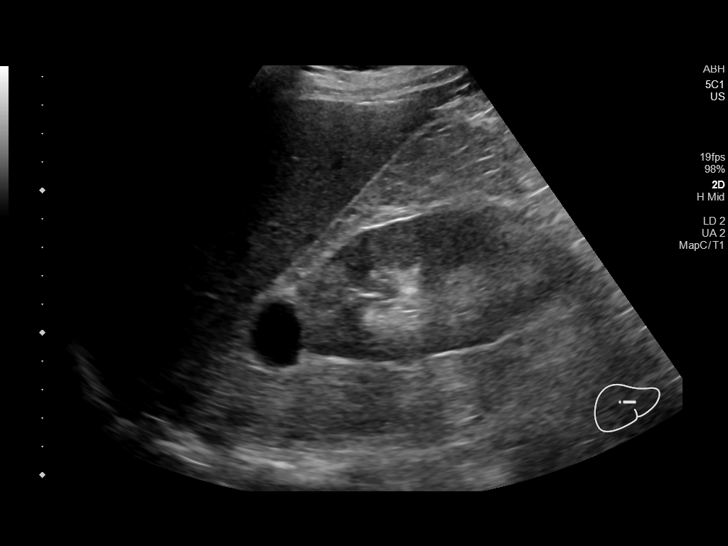
[im 45/49]
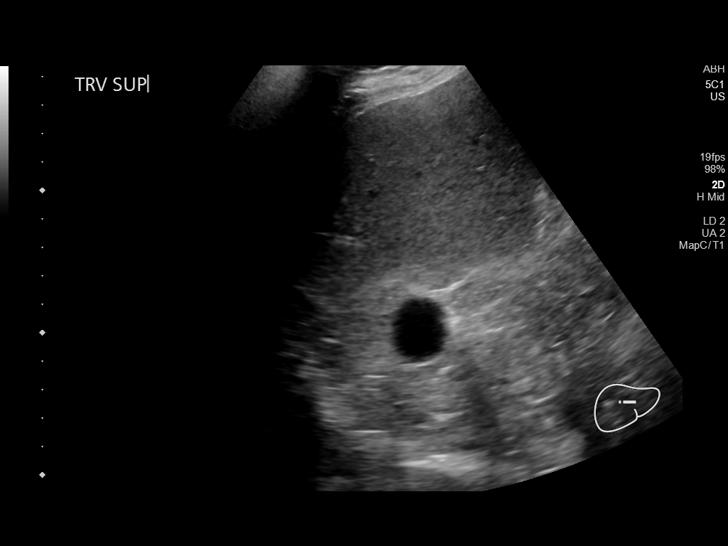
[im 49/49]
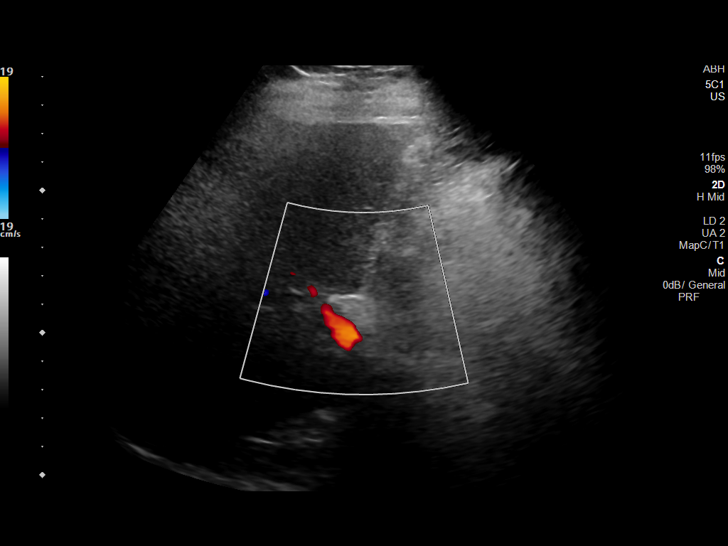

[14 of 25 positions shown; findings below may reference images not displayed]

FINDINGS: Gallbladder:

Surgically removed.

Common bile duct:

Diameter: 6.4 mm

Liver:

Increased in echogenicity consistent with fatty infiltration. Portal
vein is patent on color Doppler imaging with normal direction of
blood flow towards the liver.

Other: Incidental note is made of a 2.6 cm upper pole right renal
cyst.
IMPRESSION: Fatty liver.

Status post cholecystectomy.

Incidental note is made of right renal cyst.

## 2021-07-24 ENCOUNTER — Telehealth: Payer: Self-pay | Admitting: Cardiovascular Disease

## 2021-07-24 NOTE — Telephone Encounter (Signed)
° °  Name: Joel Stanley  DOB: [birth year]  MRN: [id number]   Primary Cardiologist: Nanetta Batty, MD  Chart reviewed as part of pre-operative protocol coverage. Patient was contacted 07/24/2021 in reference to pre-operative risk assessment for pending surgery as outlined below.  Joel Stanley was last seen on 05/28/2021 by Dr. Nanetta Batty.  Since that day, Joel Stanley has done well without exertional chest pain or worsening dyspnea.  Therefore, based on ACC/AHA guidelines, the patient would be at acceptable risk for the planned procedure without further cardiovascular testing.   Will check with the surgeon's office to see if the patient need to come off of aspirin and Plavix prior to the procedure.  Surgery is tomorrow, we typically hold aspirin and Plavix for 5 days prior to the surgery, there is not enough holding time between now and time of this procedure.  However, most of the time, vitrectomy surgery is considered low bleeding risk surgery and holding the antiplatelet therapy may not be necessary prior to this type of surgery.  The patient was advised that if he develops new symptoms prior to surgery to contact our office to arrange for a follow-up visit, and he verbalized understanding.  I will route this recommendation to the requesting party via Epic fax function and remove from pre-op pool. Please call with questions.  [place], [place] 07/24/2021, 2:56 PM

## 2021-07-24 NOTE — Telephone Encounter (Signed)
° °  Pre-operative Risk Assessment    Patient Name: Joel Stanley  DOB: [birth year] MRN: [id number]      Request for Surgical Clearance    Procedure:   Vitrectomy Surgery  Date of Surgery:  Clearance 07/26/21                                 Surgeon:  Dr Sherlynn Stalls Surgeon's Group or Practice Name:   [facility] Phone number:   [phone] Fax number:  [phone]   Type of Clearance Requested:   - Medical  - Pharmacy:  Hold Clopidogrel (Plavix)     Type of Anesthesia:  MAC   Additional requests/questions:  Please advise surgeon/provider what medications should be held.  Olin Pia   07/24/2021, 11:09 AM

## 2021-07-24 NOTE — Telephone Encounter (Signed)
Follow Up:    Joel Stanley  is calling to check on the status of patient's clearance. Patient is scheduled for surgery for tomorrow(07-25-21). Please fax asap to [phone] Att:  Josephine Cables

## 2021-07-24 NOTE — Telephone Encounter (Signed)
Spoke with Joel Stanley of Dr. Allyne Gee office, Dr. Allyne Gee would prefer to hold aspirin and plavix for however short of a time available. The patient has taken the aspirin and plavix this morning, I will instruct him to hold ASA and plavix tomorrow morning prior to the surgery and restart afterward at the surgeon's discretion.

## 2021-07-24 NOTE — Telephone Encounter (Signed)
° °  Lauren with [facility] calling to f/u, she said pt's procedure is tomorrow 07/25/21

## 2021-09-10 ENCOUNTER — Encounter (HOSPITAL_BASED_OUTPATIENT_CLINIC_OR_DEPARTMENT_OTHER): Payer: Self-pay | Admitting: *Deleted

## 2021-09-10 ENCOUNTER — Other Ambulatory Visit: Payer: Self-pay

## 2021-09-10 ENCOUNTER — Emergency Department (HOSPITAL_BASED_OUTPATIENT_CLINIC_OR_DEPARTMENT_OTHER): Payer: No Typology Code available for payment source

## 2021-09-10 ENCOUNTER — Emergency Department (HOSPITAL_BASED_OUTPATIENT_CLINIC_OR_DEPARTMENT_OTHER)
Admission: EM | Admit: 2021-09-10 | Discharge: 2021-09-10 | Disposition: A | Discharge status: Home / Self Care | Payer: No Typology Code available for payment source | Attending: Emergency Medicine | Admitting: Emergency Medicine

## 2021-09-10 DIAGNOSIS — E860 Dehydration: Secondary | ICD-10-CM | POA: Insufficient documentation

## 2021-09-10 DIAGNOSIS — I10 Essential (primary) hypertension: Secondary | ICD-10-CM | POA: Diagnosis not present

## 2021-09-10 DIAGNOSIS — R531 Weakness: Secondary | ICD-10-CM | POA: Diagnosis present

## 2021-09-10 DIAGNOSIS — Z7984 Long term (current) use of oral hypoglycemic drugs: Secondary | ICD-10-CM | POA: Diagnosis not present

## 2021-09-10 DIAGNOSIS — E119 Type 2 diabetes mellitus without complications: Secondary | ICD-10-CM | POA: Diagnosis not present

## 2021-09-10 DIAGNOSIS — I2510 Atherosclerotic heart disease of native coronary artery without angina pectoris: Secondary | ICD-10-CM | POA: Diagnosis not present

## 2021-09-10 DIAGNOSIS — Z79899 Other long term (current) drug therapy: Secondary | ICD-10-CM | POA: Diagnosis not present

## 2021-09-10 DIAGNOSIS — Z7982 Long term (current) use of aspirin: Secondary | ICD-10-CM | POA: Insufficient documentation

## 2021-09-10 DIAGNOSIS — Z7902 Long term (current) use of antithrombotics/antiplatelets: Secondary | ICD-10-CM | POA: Diagnosis not present

## 2021-09-10 LAB — CBC WITH DIFFERENTIAL/PLATELET
Abs Immature Granulocytes: 0.02 10*3/uL (ref 0.00–0.07)
Basophils Absolute: 0 10*3/uL (ref 0.0–0.1)
Basophils Relative: 0 %
Eosinophils Absolute: 0.3 10*3/uL (ref 0.0–0.5)
Eosinophils Relative: 5 %
HCT: 46.6 % (ref 39.0–52.0)
Hemoglobin: 15.8 g/dL (ref 13.0–17.0)
Immature Granulocytes: 0 %
Lymphocytes Relative: 3 %
Lymphs Abs: 0.2 10*3/uL — ABNORMAL LOW (ref 0.7–4.0)
MCH: 30.7 pg (ref 26.0–34.0)
MCHC: 33.9 g/dL (ref 30.0–36.0)
MCV: 90.5 fL (ref 80.0–100.0)
Monocytes Absolute: 0.3 10*3/uL (ref 0.1–1.0)
Monocytes Relative: 4 %
Neutro Abs: 5.7 10*3/uL (ref 1.7–7.7)
Neutrophils Relative %: 88 %
Platelets: 257 10*3/uL (ref 150–400)
RBC: 5.15 MIL/uL (ref 4.22–5.81)
RDW: 12.9 % (ref 11.5–15.5)
WBC: 6.6 10*3/uL (ref 4.0–10.5)
nRBC: 0 % (ref 0.0–0.2)

## 2021-09-10 LAB — COMPREHENSIVE METABOLIC PANEL
ALT: 19 U/L (ref 0–44)
AST: 20 U/L (ref 15–41)
Albumin: 4.1 g/dL (ref 3.5–5.0)
Alkaline Phosphatase: 51 U/L (ref 38–126)
Anion gap: 12 (ref 5–15)
BUN: 33 mg/dL — ABNORMAL HIGH (ref 8–23)
CO2: 22 mmol/L (ref 22–32)
Calcium: 8.9 mg/dL (ref 8.9–10.3)
Chloride: 97 mmol/L — ABNORMAL LOW (ref 98–111)
Creatinine, Ser: 1.64 mg/dL — ABNORMAL HIGH (ref 0.61–1.24)
GFR, Estimated: 42 mL/min — ABNORMAL LOW (ref >60)
Glucose, Bld: 280 mg/dL — ABNORMAL HIGH (ref 70–99)
Potassium: 4.5 mmol/L (ref 3.5–5.1)
Sodium: 131 mmol/L — ABNORMAL LOW (ref 135–145)
Total Bilirubin: 1.3 mg/dL — ABNORMAL HIGH (ref 0.3–1.2)
Total Protein: 7.6 g/dL (ref 6.5–8.1)

## 2021-09-10 LAB — URINALYSIS, ROUTINE W REFLEX MICROSCOPIC
Bilirubin Urine: NEGATIVE
Glucose, UA: >=500 mg/dL — AB
Ketones, ur: 15 mg/dL — AB
Leukocytes,Ua: NEGATIVE
Nitrite: NEGATIVE
Protein, ur: NEGATIVE mg/dL
Specific Gravity, Urine: 1.015 (ref 1.005–1.030)
pH: 5 (ref 5.0–8.0)

## 2021-09-10 LAB — LACTIC ACID, PLASMA: Lactic Acid, Venous: 1.6 mmol/L (ref 0.5–1.9)

## 2021-09-10 LAB — LIPASE, BLOOD: Lipase: 151 U/L — ABNORMAL HIGH (ref 11–51)

## 2021-09-10 LAB — URINALYSIS, MICROSCOPIC (REFLEX): WBC, UA: NONE SEEN WBC/hpf (ref 0–5)

## 2021-09-10 LAB — TROPONIN I (HIGH SENSITIVITY)
Troponin I (High Sensitivity): 11 ng/L (ref <18)
Troponin I (High Sensitivity): 10 ng/L (ref <18)

## 2021-09-10 MED ORDER — SODIUM CHLORIDE 0.9 % IV BOLUS
250.0000 mL | Freq: Once | INTRAVENOUS | Status: AC
  Administered 2021-09-10: 250 mL via INTRAVENOUS

## 2021-09-10 MED ORDER — SODIUM CHLORIDE 0.9 % IV BOLUS
1000.0000 mL | Freq: Once | INTRAVENOUS | Status: AC
Start: 2021-09-10 — End: 2021-09-10
  Administered 2021-09-10: 1000 mL via INTRAVENOUS

## 2021-09-10 NOTE — ED Notes (Signed)
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 ?

## 2021-09-10 NOTE — Discharge Instructions (Signed)
Follow-up with your primary care doctor to further discuss your diabetes medications. ?

## 2021-09-10 NOTE — ED Triage Notes (Signed)
Hypotension. Fatigue x 3 days. He came from [facility] hospital after having a Covid and Flu test that will result in an hour.  ?

## 2021-09-10 NOTE — ED Provider Notes (Signed)
[facility] EMERGENCY DEPARTMENT Provider Note   CSN: [id number] Arrival date & time: 09/10/21  1259     History  Chief Complaint  Patient presents with   Hypotension   Fatigue    Joel Stanley is a 81 y.o. male.  The history is provided by the patient.  Weakness Severity:  Mild Onset quality:  Gradual Duration:  2 days Timing:  Constant Progression:  Unchanged Chronicity:  New Context: dehydration   Context: not recent infection   Relieved by:  Nothing Worsened by:  Nothing Associated symptoms: vomiting   Associated symptoms: no abdominal pain, no anorexia, no aphasia, no arthralgias, no ataxia, no chest pain, no cough, no diarrhea, no difficulty walking, no dizziness, no drooling, no dysphagia, no dysuria, no numbness in extremities, no falls, no fever, no foul-smelling urine, no frequency, no headaches, no hematochezia, no lethargy, no loss of consciousness, no melena, no myalgias, no nausea, no near-syncope, no seizures, no sensory-motor deficit, no shortness of breath, no stroke symptoms, no syncope, no urgency and no vision change   Risk factors: coronary artery disease   Risk factors comment:  HTN. HLD     Home Medications Prior to Admission medications   Medication Sig Start Date End Date Taking? Authorizing Provider  acetaminophen (TYLENOL) 650 MG CR tablet Take 650 mg by mouth 2 (two) times daily.    [provider]  aspirin 81 MG chewable tablet Chew 1 tablet (81 mg total) by mouth daily. 12/15/17   Joseph Art, DO  Cholecalciferol (VITAMIN D-3) 1000 UNITS CAPS Take 2,000 Units by mouth daily.    [provider]  clopidogrel (PLAVIX) 75 MG tablet Take 75 mg by mouth daily.    [provider]  EPINEPHrine 0.3 mg/0.3 mL IJ SOAJ injection INJECT 0.3 ML INTRAMUSCULARLY AS DIRECTED FOR ANAPHYLACTIC REACTION 08/02/20   [provider]  famotidine (PEPCID) 20 MG tablet Take 1 tablet by mouth 2 (two) times daily.  08/02/20   [provider]  hydrochlorothiazide (HYDRODIURIL) 25 MG tablet Take 25 mg by mouth daily.    [provider]  lisinopril (ZESTRIL) 40 MG tablet Take 1 tablet (40 mg total) by mouth daily. 06/17/20   Azucena Fallen, MD  metFORMIN (GLUCOPHAGE) 500 MG tablet Take 1 tablet (500 mg total) by mouth 2 (two) times daily with a meal. 12/17/17   Joseph Art, DO  metoprolol (LOPRESSOR) 100 MG tablet Take 50 mg by mouth 2 (two) times daily. Taking 1/2 tablet twice a day    [provider]  nitroGLYCERIN (NITROSTAT) 0.4 MG SL tablet Place 1 tablet (0.4 mg total) under the tongue every 5 (five) minutes x 3 doses as needed for chest pain. 12/14/17   Joseph Art, DO  polyvinyl alcohol (ARTIFICIAL TEARS) 1.4 % ophthalmic solution 1 drop as needed for dry eyes.    [provider]  rosuvastatin (CRESTOR) 10 MG tablet Take 10 mg by mouth every evening.    [provider]      Allergies    Yellow jacket venom    Review of Systems   Review of Systems  Constitutional:  Negative for fever.  HENT:  Negative for drooling.   Respiratory:  Negative for cough and shortness of breath.   Cardiovascular:  Negative for chest pain, syncope and near-syncope.  Gastrointestinal:  Positive for vomiting. Negative for abdominal pain, anorexia, diarrhea, dysphagia, hematochezia, melena and nausea.  Genitourinary:  Negative for dysuria, frequency and urgency.  Musculoskeletal:  Negative for arthralgias, falls and myalgias.  Neurological:  Positive for weakness. Negative for dizziness, seizures, loss of consciousness and headaches.   Physical Exam Updated Vital Signs BP 137/70   Pulse 71   Temp 98.1 F (36.7 C) (Oral)   Resp 18   Ht 5\' 11"  (1.803 m)   Wt 93.9 kg   SpO2 94%   BMI 28.87 kg/m  Physical Exam Vitals and nursing note reviewed.  Constitutional:      General: He is not in acute distress.    Appearance: He is well-developed.  HENT:     Head:  Normocephalic and atraumatic.     Nose: Nose normal.     Mouth/Throat:     Mouth: Mucous membranes are moist.  Eyes:     Extraocular Movements: Extraocular movements intact.     Conjunctiva/sclera: Conjunctivae normal.     Pupils: Pupils are equal, round, and reactive to light.  Cardiovascular:     Rate and Rhythm: Normal rate and regular rhythm.     Pulses: Normal pulses.     Heart sounds: Normal heart sounds. No murmur heard. Pulmonary:     Effort: Pulmonary effort is normal. No respiratory distress.     Breath sounds: Normal breath sounds.  Abdominal:     Palpations: Abdomen is soft.     Tenderness: There is no abdominal tenderness.  Musculoskeletal:        General: No swelling.     Cervical back: Neck supple.  Skin:    General: Skin is warm and dry.     Capillary Refill: Capillary refill takes less than 2 seconds.  Neurological:     General: No focal deficit present.     Mental Status: He is alert and oriented to person, place, and time.     Cranial Nerves: No cranial nerve deficit.     Sensory: No sensory deficit.     Motor: No weakness.     Coordination: Coordination normal.     Comments: 5+ out of 5 strength throughout, normal sensation, no drift, normal finger-nose-finger, normal speech  Psychiatric:        Mood and Affect: Mood normal.    ED Results / Procedures / Treatments   Labs (all labs ordered are listed, but only abnormal results are displayed) Labs Reviewed  CBC WITH DIFFERENTIAL/PLATELET - Abnormal; Notable for the following components:      Result Value   Lymphs Abs 0.2 (*)    All other components within normal limits  COMPREHENSIVE METABOLIC PANEL - Abnormal; Notable for the following components:   Sodium 131 (*)    Chloride 97 (*)    Glucose, Bld 280 (*)    BUN 33 (*)    Creatinine, Ser 1.64 (*)    Total Bilirubin 1.3 (*)    GFR, Estimated 42 (*)    All other components within normal limits  LIPASE, BLOOD - Abnormal; Notable for the following  components:   Lipase 151 (*)    All other components within normal limits  URINALYSIS, ROUTINE W REFLEX MICROSCOPIC - Abnormal; Notable for the following components:   Glucose, UA >=500 (*)    Hgb urine dipstick TRACE (*)    Ketones, ur 15 (*)    All other components within normal limits  URINALYSIS, MICROSCOPIC (REFLEX) - Abnormal; Notable for the following components:   Bacteria, UA RARE (*)    All other components within normal limits  LACTIC ACID, PLASMA  TROPONIN I (HIGH SENSITIVITY)  TROPONIN I (HIGH  SENSITIVITY)    EKG EKG Interpretation  Date/Time:  Tuesday September 10 2021 13:25:51 EDT Ventricular Rate:  59 PR Interval:  174 QRS Duration: 98 QT Interval:  424 QTC Calculation: 419 R Axis:   -53 Text Interpretation: Sinus bradycardia with marked sinus arrhythmia Left axis deviation Left ventricular hypertrophy ( R in aVL , Romhilt-Estes ) Possible Lateral infarct , age undetermined Inferior infarct , age undetermined Abnormal ECG When compared with ECG of 15-Jun-2020 09:07, New ST abnormalities inferior leads, artifact Confirmed by Alvira Monday ([id number]) on 09/10/2021 1:44:45 PM  Radiology DG Chest Portable 1 View  Result Date: 09/10/2021 CLINICAL DATA:  Cough.  Hypertension.  Fatigue. EXAM: PORTABLE CHEST 1 VIEW COMPARISON:  06/14/2020 FINDINGS: The heart size and mediastinal contours are within normal limits. Both lungs are clear. The visualized skeletal structures are unremarkable. IMPRESSION: No active disease. Electronically Signed   By: Paulina Fusi M.D.   On: 09/10/2021 15:08    Procedures Procedures    Medications Ordered in ED Medications  sodium chloride 0.9 % bolus 1,000 mL (0 mLs Intravenous Stopped 09/10/21 1648)  sodium chloride 0.9 % bolus 250 mL (250 mLs Intravenous New Bag/Given 09/10/21 1648)    ED Course/ Medical Decision Making/ A&P                           Medical Decision Making Amount and/or Complexity of Data Reviewed Labs:  ordered. Radiology: ordered.   CASSANDRA ONDERDONK is here for generalized fatigue for the last several days.  Possibly had low blood pressure while at the [place] today being tested for COVID and flu which were negative.  Patient with normal vitals here.  Has had some general weakness but denies any chest pain, shortness of breath, abdominal pain, nausea, vomiting, diarrhea.  Patient overall appears well.  Neurologically he is intact.  Denies any pain with urination.  EKG per my review and interpretation shows sinus rhythm.  No ischemic changes.  Patient has history of CAD, hypertension, high cholesterol, diabetes as comorbidities.  Differential is wide including dehydration versus electrolyte abnormality versus infectious process.  Much lower concern for cardiac process such as ACS or PE.  Will check CBC, CMP, lipase, lactic acid, chest x-ray, urinalysis.  Will give IV fluids and reevaluate.  Lab work per my review and interpretation shows no evidence of urine infection.  Troponin normal x2.  Doubt ACS.  Chest x-ray per my review and interpretation shows no evidence of pneumonia.  No other acute process.  Patient has no significant anemia, leukocytosis.  Creatinine mildly elevated from baseline to 1.6.  Blood sugars 280.  However patient is not in DKA.  Lipase is mildly elevated but he has no abdominal pain or nausea or vomiting.  No concern for pancreatitis or cholecystitis.  Feeling much better after IV fluids.  Overall suspect dehydration likely the cause of his symptoms today.  I think he would benefit from additional changes to diabetes medications.  Recommended he follow-up with his primary care doctor for this.  Discharged in good condition.  This chart was dictated using voice recognition software.  Despite best efforts to proofread,  errors can occur which can change the documentation meaning.         Final Clinical Impression(s) / ED Diagnoses Final diagnoses:  Dehydration    Rx / DC  Orders ED Discharge Orders     None         Virgina Norfolk, DO 09/10/21  1748 ° °

## 2021-10-29 ENCOUNTER — Telehealth: Payer: Self-pay | Admitting: *Deleted

## 2021-10-29 NOTE — Telephone Encounter (Signed)
? ?  Name: Joel Stanley  ?DOB: 1941/04/02  ?MRN: [id number]  ? ?Primary Cardiologist: Nanetta Batty, MD ? ?Chart reviewed as part of pre-operative protocol coverage. Patient was contacted 10/29/2021 in reference to pre-operative risk assessment for pending surgery as outlined below.  Joel Stanley was last seen on 05/28/2021 by Dr. Allyson Sabal.  Since that day, Joel Stanley has done well without exertional chest pain or worsening dyspnea. ? ?Therefore, based on ACC/AHA guidelines, the patient would be at acceptable risk for the planned procedure without further cardiovascular testing.  ? ?Patient may hold aspirin and Plavix for 7 days prior to the procedure and restart as soon as possible afterward at the surgeon's discretion. ? ?The patient was advised that if he develops new symptoms prior to surgery to contact our office to arrange for a follow-up visit, and he verbalized understanding. ? ?I will route this recommendation to the requesting party via Epic fax function and remove from pre-op pool. Please call with questions. ? ?Azalee Course, [place] ?10/29/2021, 2:57 PM ? ?

## 2021-10-29 NOTE — Telephone Encounter (Signed)
? ?  Pre-operative Risk Assessment  ?  ?Patient Name: Joel Stanley  ?DOB: [birth year] ?MRN: [id number]  ? ?  ? ?Request for Surgical Clearance   ? ?Procedure:   vitrectomy ? ?Date of Surgery:  Clearance 11/07/21                              ?   ?Surgeon:  Dr. Sherlynn Stalls ?Surgeon's Group or Practice Name:  [facility], [place] ?Phone number:  [phone] ?Fax number:  [phone] ?  ?Type of Clearance Requested:   ?- Medical  ?- Pharmacy:  Hold Clopidogrel (Plavix)   ?  ?Type of Anesthesia:  MAC ?  ?Additional requests/questions:   ? ?Signed, ?Dearra Myhand A Sharion Grieves   ?10/29/2021, 9:30 AM  ? ?

## 2022-05-20 ENCOUNTER — Emergency Department (HOSPITAL_COMMUNITY): Payer: No Typology Code available for payment source

## 2022-05-20 ENCOUNTER — Emergency Department (HOSPITAL_COMMUNITY)
Admission: EM | Admit: 2022-05-20 | Discharge: 2022-05-20 | Disposition: A | Discharge status: Home / Self Care | Payer: No Typology Code available for payment source | Attending: Emergency Medicine | Admitting: Emergency Medicine

## 2022-05-20 ENCOUNTER — Other Ambulatory Visit: Payer: Self-pay

## 2022-05-20 ENCOUNTER — Encounter (HOSPITAL_COMMUNITY): Payer: Self-pay | Admitting: Radiology

## 2022-05-20 DIAGNOSIS — R000 Tachycardia, unspecified: Secondary | ICD-10-CM | POA: Insufficient documentation

## 2022-05-20 DIAGNOSIS — Z7982 Long term (current) use of aspirin: Secondary | ICD-10-CM | POA: Insufficient documentation

## 2022-05-20 DIAGNOSIS — Z79899 Other long term (current) drug therapy: Secondary | ICD-10-CM | POA: Diagnosis not present

## 2022-05-20 DIAGNOSIS — Z1152 Encounter for screening for COVID-19: Secondary | ICD-10-CM | POA: Diagnosis not present

## 2022-05-20 DIAGNOSIS — Z7984 Long term (current) use of oral hypoglycemic drugs: Secondary | ICD-10-CM | POA: Insufficient documentation

## 2022-05-20 DIAGNOSIS — R531 Weakness: Secondary | ICD-10-CM | POA: Diagnosis present

## 2022-05-20 LAB — ETHANOL: Alcohol, Ethyl (B): <10 mg/dL (ref <10)

## 2022-05-20 LAB — HEPATIC FUNCTION PANEL
ALT: 15 U/L (ref 0–44)
AST: 19 U/L (ref 15–41)
Albumin: 4.2 g/dL (ref 3.5–5.0)
Alkaline Phosphatase: 44 U/L (ref 38–126)
Bilirubin, Direct: 0.2 mg/dL (ref 0.0–0.2)
Indirect Bilirubin: 0.8 mg/dL (ref 0.3–0.9)
Total Bilirubin: 1 mg/dL (ref 0.3–1.2)
Total Protein: 7.5 g/dL (ref 6.5–8.1)

## 2022-05-20 LAB — CBC
HCT: 46.3 % (ref 39.0–52.0)
Hemoglobin: 15.8 g/dL (ref 13.0–17.0)
MCH: 30.1 pg (ref 26.0–34.0)
MCHC: 34.1 g/dL (ref 30.0–36.0)
MCV: 88.2 fL (ref 80.0–100.0)
Platelets: 227 10*3/uL (ref 150–400)
RBC: 5.25 MIL/uL (ref 4.22–5.81)
RDW: 12.9 % (ref 11.5–15.5)
WBC: 6.1 10*3/uL (ref 4.0–10.5)
nRBC: 0 % (ref 0.0–0.2)

## 2022-05-20 LAB — BASIC METABOLIC PANEL
Anion gap: 13 (ref 5–15)
BUN: 17 mg/dL (ref 8–23)
CO2: 22 mmol/L (ref 22–32)
Calcium: 8.8 mg/dL — ABNORMAL LOW (ref 8.9–10.3)
Chloride: 100 mmol/L (ref 98–111)
Creatinine, Ser: 1.24 mg/dL (ref 0.61–1.24)
GFR, Estimated: 58 mL/min — ABNORMAL LOW (ref >60)
Glucose, Bld: 272 mg/dL — ABNORMAL HIGH (ref 70–99)
Potassium: 3.7 mmol/L (ref 3.5–5.1)
Sodium: 135 mmol/L (ref 135–145)

## 2022-05-20 LAB — SARS CORONAVIRUS 2 BY RT PCR: SARS Coronavirus 2 by RT PCR: NEGATIVE

## 2022-05-20 LAB — CBG MONITORING, ED: Glucose-Capillary: 264 mg/dL — ABNORMAL HIGH (ref 70–99)

## 2022-05-20 MED ORDER — SODIUM CHLORIDE 0.9 % IV BOLUS
500.0000 mL | Freq: Once | INTRAVENOUS | Status: AC
  Administered 2022-05-20: 500 mL via INTRAVENOUS
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
## 2022-05-20 NOTE — ED Triage Notes (Signed)
Pt bib ems from home c/o weakness x 3 days. Pt a&o x 4, non productive cough.
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
## 2022-05-20 NOTE — ED Provider Notes (Signed)
4:52 PM Assumed care of patient from off-going team. For more details, please see note from same day.  In brief, this is a 81 y.o. male p/w lower extremity weakness  Plan/Dispo at time of sign-out & ED Course since sign-out: [ ]  MRI r/o stroke, then DC if okay  BP (!) 125/105 (BP Location: Right Arm)   Pulse 86   Temp (!) 97.4 F (36.3 C) (Oral)   Resp 19   Ht 5\' 11"  (1.803 m)   Wt 94 kg   SpO2 94%   BMI 28.90 kg/m    ED Course:   Clinical Course as of 05/21/22 0046  Tue May 20, 2022  1901 MR BRAIN WO CONTRAST 1. No acute intracranial abnormality. 2. Advanced volume loss and findings of chronic small vessel ischemia.   [HN]  1953 ECG Heart Rate: 99 After an additional 500 cc fluid [HN]    Clinical Course User Index [HN] 05/23/22, MD   Discussed with family at bedside that patient was likely dehydrated today.  No CVA on MRI.  They report that he has had this problem before, once approximately 6 to 8 months ago.  I discussed ways with them to keep him hydrated, such as starting habits like drinking a glass of water every morning when he wakes up or with eating breakfast.  Encourage follow-up with PCP.  Dispo: DC with discharge instructions and return precautions ------------------------------- May 22, 2022, MD Emergency Medicine  This note was created using dictation software, which may contain spelling or grammatical errors.   Loetta Rough, MD 05/21/22 [phone]

## 2022-05-20 NOTE — Discharge Instructions (Addendum)
Thank you for coming to [facility] Emergency Department. You were seen for generalized weakness. We did an exam, labs, and imaging, and these showed no acute findings.  Please follow up with your primary care provider within 1 week.   Do not hesitate to return to the ED or call 911 if you experience: -Worsening symptoms -Lightheadedness, passing out -Fevers/chills -Anything else that concerns you

## 2022-07-29 ENCOUNTER — Ambulatory Visit: Payer: No Typology Code available for payment source | Attending: Cardiovascular Disease

## 2022-07-29 ENCOUNTER — Other Ambulatory Visit: Payer: Self-pay | Admitting: Cardiovascular Disease

## 2022-07-29 ENCOUNTER — Encounter: Payer: Self-pay | Admitting: Cardiovascular Disease

## 2022-07-29 ENCOUNTER — Ambulatory Visit: Payer: Medicare PPO | Attending: Cardiovascular Disease | Admitting: Cardiovascular Disease

## 2022-07-29 VITALS — BP 124/76 | HR 42 | Ht 70.0 in | Wt 201.6 lb

## 2022-07-29 DIAGNOSIS — E1159 Type 2 diabetes mellitus with other circulatory complications: Secondary | ICD-10-CM

## 2022-07-29 DIAGNOSIS — E782 Mixed hyperlipidemia: Secondary | ICD-10-CM

## 2022-07-29 DIAGNOSIS — R001 Bradycardia, unspecified: Secondary | ICD-10-CM

## 2022-07-29 DIAGNOSIS — I2510 Atherosclerotic heart disease of native coronary artery without angina pectoris: Secondary | ICD-10-CM

## 2022-07-29 DIAGNOSIS — I152 Hypertension secondary to endocrine disorders: Secondary | ICD-10-CM

## 2022-07-29 MED ORDER — NITROGLYCERIN 0.4 MG SL SUBL: 0.4000 mg | SUBLINGUAL_TABLET | SUBLINGUAL | 3 refills | Status: DC | PRN

## 2022-07-29 MED ORDER — NITROGLYCERIN 0.4 MG SL SUBL: 0.4000 mg | SUBLINGUAL_TABLET | SUBLINGUAL | 3 refills | Status: AC | PRN

## 2022-07-29 NOTE — Addendum Note (Signed)
Addended by: Orma Render on: 07/29/2022 04:46 PM   Modules accepted: Orders

## 2022-07-29 NOTE — Assessment & Plan Note (Signed)
History of hyperlipidemia on statin therapy.  We will recheck a fasting lipid liver profile. 

## 2022-07-29 NOTE — Assessment & Plan Note (Signed)
History of essential hypertension with blood pressure measured today 124/76.  He is on hydrochlorothiazide, lisinopril and metoprolol.

## 2022-07-29 NOTE — Progress Notes (Signed)
07/29/2022 Joel Stanley   [birth year]  [id number]  Primary Physician Administration, [facility] Cardiologist: Lorretta Harp MD FACP, [place], [place], [place]  HPI:  Joel Stanley is a 82 y.o.   mildly to moderately overweight divorced Caucasian male father of 2, grandfather of 2 grandchildren whose care I assumed from Dr. Aldona Bar. I last saw him in the office 05/28/2021.  He is paid by his significant other Pamala Hurry today.  He is currently retired. 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 His cardiac risk factor profile is positive for remote tobacco abuse, having quit 30 years ago, treated hypertension, hyperlipidemia and non-insulin-requiring diabetes. He does have a family history of heart disease with a father who died of an MI at age 3 and a brother who has CAD as well. He had his first MI in 1997 and multiple stents since that time, beginning in 2006 and 2009. I intervened on him in the setting of unstable angina on July 21, 2010 and re-dilated his PLA stent which had a 75% "in-stent" restenosis and stented his PDA. His EF at that time was 45% with moderate inferobasal hypokinesia. He does get dyspnea but denies chest pain. [facility] Chalmers Cater follow his lab work.  He did have a Myoview stress test performed 09/21/13 which was low risk and nonischemic.    He was admitted to the hospital in June 2019 with chest pain.  He underwent diagnostic coronary angiography by myself on 12/14/2017 via the right radial approach revealing an occluded mid LAD which was old with left to left collaterals, patent stents in the PDA and PLA branches.  EF is 45%.     Since I saw him a year ago he continues to do well.  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  He did have COVID several years ago which left him with a neurologic deficit and balance issues.  He also has diabetic peripheral neuropathy.  His significant above says that he is developed some short-term  memory issues as well.  He denies chest pain or shortness of breath.   Current Meds  Medication Sig   acetaminophen (TYLENOL) 650 MG CR tablet Take 650 mg by mouth 2 (two) times daily.   aspirin 81 MG chewable tablet Chew 1 tablet (81 mg total) by mouth daily.   Cholecalciferol (VITAMIN D-3) 1000 UNITS CAPS Take 2,000 Units by mouth daily.   clopidogrel (PLAVIX) 75 MG tablet Take 75 mg by mouth daily.   famotidine (PEPCID) 20 MG tablet Take 1 tablet by mouth 2 (two) times daily.   hydrochlorothiazide (HYDRODIURIL) 25 MG tablet Take 25 mg by mouth daily.   lisinopril (ZESTRIL) 40 MG tablet Take 1 tablet (40 mg total) by mouth daily.   metFORMIN (GLUCOPHAGE) 500 MG tablet Take 1 tablet (500 mg total) by mouth 2 (two) times daily with a meal.   metoprolol (LOPRESSOR) 100 MG tablet Take 50 mg by mouth 2 (two) times daily. Taking 1/2 tablet twice a day   polyvinyl alcohol (ARTIFICIAL TEARS) 1.4 % ophthalmic solution 1 drop as needed for dry eyes.   rosuvastatin (CRESTOR) 10 MG tablet Take 10 mg by mouth every evening.     Allergies  Allergen Reactions   Yellow Jacket Venom     Social History   Socioeconomic History   Marital status: Widowed    Spouse name: Not on file   Number of children: Not on file  Years of education: Not on file   Highest education level: Not on file  Occupational History   Not on file  Tobacco Use   Smoking status: Former    Types: Cigarettes    Quit date: 03/02/1973    Years since quitting: 49.4   Smokeless tobacco: Never  Vaping Use   Vaping Use: Never used  Substance and Sexual Activity   Alcohol use: Yes    Comment: social    Drug use: Never   Sexual activity: Not on file  Other Topics Concern   Not on file  Social History Narrative   Not on file   Social Determinants of Health   Financial Resource Strain: Not on file  Food Insecurity: Not on file  Transportation Needs: Not on file  Physical Activity: Not on file  Stress: Not on file   Social Connections: Not on file  Intimate Partner Violence: Not on file     Review of Systems: General: negative for chills, fever, night sweats or weight changes.  Cardiovascular: negative for chest pain, dyspnea on exertion, edema, orthopnea, palpitations, paroxysmal nocturnal dyspnea or shortness of breath Dermatological: negative for rash Respiratory: negative for cough or wheezing Urologic: negative for hematuria Abdominal: negative for nausea, vomiting, diarrhea, bright red blood per rectum, melena, or hematemesis Neurologic: negative for visual changes, syncope, or dizziness All other systems reviewed and are otherwise negative except as noted above.    Blood pressure 124/76, pulse (!) 42, height 5\' 10"  (1.778 m), weight 201 lb 9.6 oz (91.4 kg), SpO2 96 %.  General appearance: alert and no distress Neck: no adenopathy, no carotid bruit, no JVD, supple, symmetrical, trachea midline, and thyroid not enlarged, symmetric, no tenderness/mass/nodules Lungs: clear to auscultation bilaterally Heart: regular rate and rhythm, S1, S2 normal, no murmur, click, rub or gallop Extremities: extremities normal, atraumatic, no cyanosis or edema Pulses: 2+ and symmetric Skin: Skin color, texture, turgor normal. No rashes or lesions Neurologic: Grossly normal  EKG sinus bradycardia 42 with left axis deviation and early R wave transition.  I personally reviewed this EKG.  ASSESSMENT AND PLAN:   CAD (coronary artery disease) History of CAD status post myocardial infarction 1997 with multiple stents since that time.  He has stents in 2006 and 2009.  I intervened on him in the setting of unstable angina 07/21/2010 and redilate his PLA stent which had 75% "in-stent restenosis.  His last catheterization by myself performed radially 12/10/2017 revealed an occluded mid LAD which was old with left left collaterals, patent stent in the PDA and PLA.  See it.  45% at that time.  He lives on a 51 acre farm but  is slowing down somewhat.  He is asymptomatic.  Hypertension associated with diabetes ([place]) History of essential hypertension with blood pressure measured today 124/76.  He is on hydrochlorothiazide, lisinopril and metoprolol.  Hyperlipidemia History of hyperlipidemia on statin therapy.  We will recheck a fasting lipid liver profile.  Slow heart rate Heart rate today is in the low 40s.  He is on beta-blocker.  He says that he is asymptomatic although he does have balance issues.  I am going to check a 2-week Zio patch to further evaluate.     Lorretta Harp MD FACP,FACC,FAHA, [facility] 07/29/2022 11:32 AM

## 2022-07-29 NOTE — Patient Instructions (Signed)
Medication Instructions:  Your physician recommends that you continue on your current medications as directed. Please refer to the Current Medication list given to you today.  *If you need a refill on your cardiac medications before your next appointment, please call your pharmacy*   Lab Work: Your physician recommends that you return for lab work in: the next week or 2 for FASTING lipid/liver panel  If you have labs (blood work) drawn today and your tests are completely normal, you will receive your results only by: [place] (if you have MyChart) OR A paper copy in the mail If you have any lab test that is abnormal or we need to change your treatment, we will call you to review the results.   Testing/Procedures:  Bryn Gulling- Long Term Monitor Instructions  Your physician has requested you wear a ZIO patch monitor for 14 days.  This is a single patch monitor. [employer] supplies one patch monitor per enrollment. Additional stickers are not available. Please do not apply patch if you will be having a Nuclear Stress Test,  Echocardiogram, Cardiac CT, MRI, or Chest Xray during the period you would be wearing the  monitor. The patch cannot be worn during these tests. You cannot remove and re-apply the  ZIO XT patch monitor.  Your ZIO patch monitor will be mailed 3 day USPS to your address on file. It may take 3-5 days  to receive your monitor after you have been enrolled.  Once you have received your monitor, please review the enclosed instructions. Your monitor  has already been registered assigning a specific monitor serial # to you.  Billing and Patient Assistance Program Information  We have supplied [employer] with any of your insurance information on file for billing purposes. [employer] offers a sliding scale Patient Assistance Program for patients that do not have  insurance, or whose insurance does not completely cover the cost of the ZIO monitor.  You must apply for the Patient  Assistance Program to qualify for this discounted rate.  To apply, please call [employer] at [phone], select option 4, select option 2, ask to apply for  Patient Assistance Program. Theodore Demark will ask your household income, and how many people  are in your household. They will quote your out-of-pocket cost based on that information.  [employer] will also be able to set up a 54-month, interest-free payment plan if needed.  Applying the monitor   Shave hair from upper left chest.  Hold abrader disc by orange tab. Rub abrader in 40 strokes over the upper left chest as  indicated in your monitor instructions.  Clean area with 4 enclosed alcohol pads. Let dry.  Apply patch as indicated in monitor instructions. Patch will be placed under collarbone on left  side of chest with arrow pointing upward.  Rub patch adhesive wings for 2 minutes. Remove white label marked "1". Remove the white  label marked "2". Rub patch adhesive wings for 2 additional minutes.  While looking in a mirror, press and release button in center of patch. A small green light will  flash 3-4 times. This will be your only indicator that the monitor has been turned on.  Do not shower for the first 24 hours. You may shower after the first 24 hours.  Press the button if you feel a symptom. You will hear a small click. Record Date, Time and  Symptom in the Patient Logbook.  When you are ready to remove the patch, follow instructions on the last 2  pages of Patient  Logbook. Stick patch monitor onto the last page of Patient Logbook.  Place Patient Logbook in the blue and white box. Use locking tab on box and tape box closed  securely. The blue and white box has prepaid postage on it. Please place it in the mailbox as  soon as possible. Your physician should have your test results approximately 7 days after the  monitor has been mailed back to [facility]  Call [place] at [phone] if you have questions  regarding  your ZIO XT patch monitor. Call them immediately if you see an orange light blinking on your  monitor.  If your monitor falls off in less than 4 days, contact our Monitor department at [phone].  If your monitor becomes loose or falls off after 4 days call [employer] at [phone] for  suggestions on securing your monitor    Follow-Up: At [facility], you and your health needs are our priority.  As part of our continuing mission to provide you with exceptional heart care, we have created designated Provider Care Teams.  These Care Teams include your primary Cardiologist (physician) and Advanced Practice Providers (APPs -  Physician Assistants and Nurse Practitioners) who all work together to provide you with the care you need, when you need it.  We recommend signing up for the patient portal called "MyChart".  Sign up information is provided on this After Visit Summary.  MyChart is used to connect with patients for Virtual Visits (Telemedicine).  Patients are able to view lab/test results, encounter notes, upcoming appointments, etc.  Non-urgent messages can be sent to your provider as well.   To learn more about what you can do with MyChart, go to [email or web address].    Your next appointment:   6 month(s)  Provider:   Quay Burow, MD

## 2022-07-29 NOTE — Assessment & Plan Note (Signed)
Heart rate today is in the low 40s.  He is on beta-blocker.  He says that he is asymptomatic although he does have balance issues.  I am going to check a 2-week Zio patch to further evaluate.

## 2022-07-29 NOTE — Progress Notes (Unsigned)
Enrolled for [employer] to mail a ZIO XT long term holter monitor to the patients address on file.  

## 2022-07-29 NOTE — Assessment & Plan Note (Signed)
History of CAD status post myocardial infarction 1997 with multiple stents since that time.  He has stents in 2006 and 2009.  I intervened on him in the setting of unstable angina 07/21/2010 and redilate his PLA stent which had 75% "in-stent restenosis.  His last catheterization by myself performed radially 12/10/2017 revealed an occluded mid LAD which was old with left left collaterals, patent stent in the PDA and PLA.  See it.  45% at that time.  He lives on a 3 acre farm but is slowing down somewhat.  He is asymptomatic.

## 2022-08-07 DIAGNOSIS — R001 Bradycardia, unspecified: Secondary | ICD-10-CM | POA: Diagnosis not present

## 2022-08-27 IMAGING — DX DG CHEST 1V PORT
1 series · 1 of 1 positions shown · non-contrast
Comparison: 06/14/2020

CLINICAL DATA: Cough.  Hypertension.  Fatigue.

EXAM:
PORTABLE CHEST 1 VIEW

[chest ap]
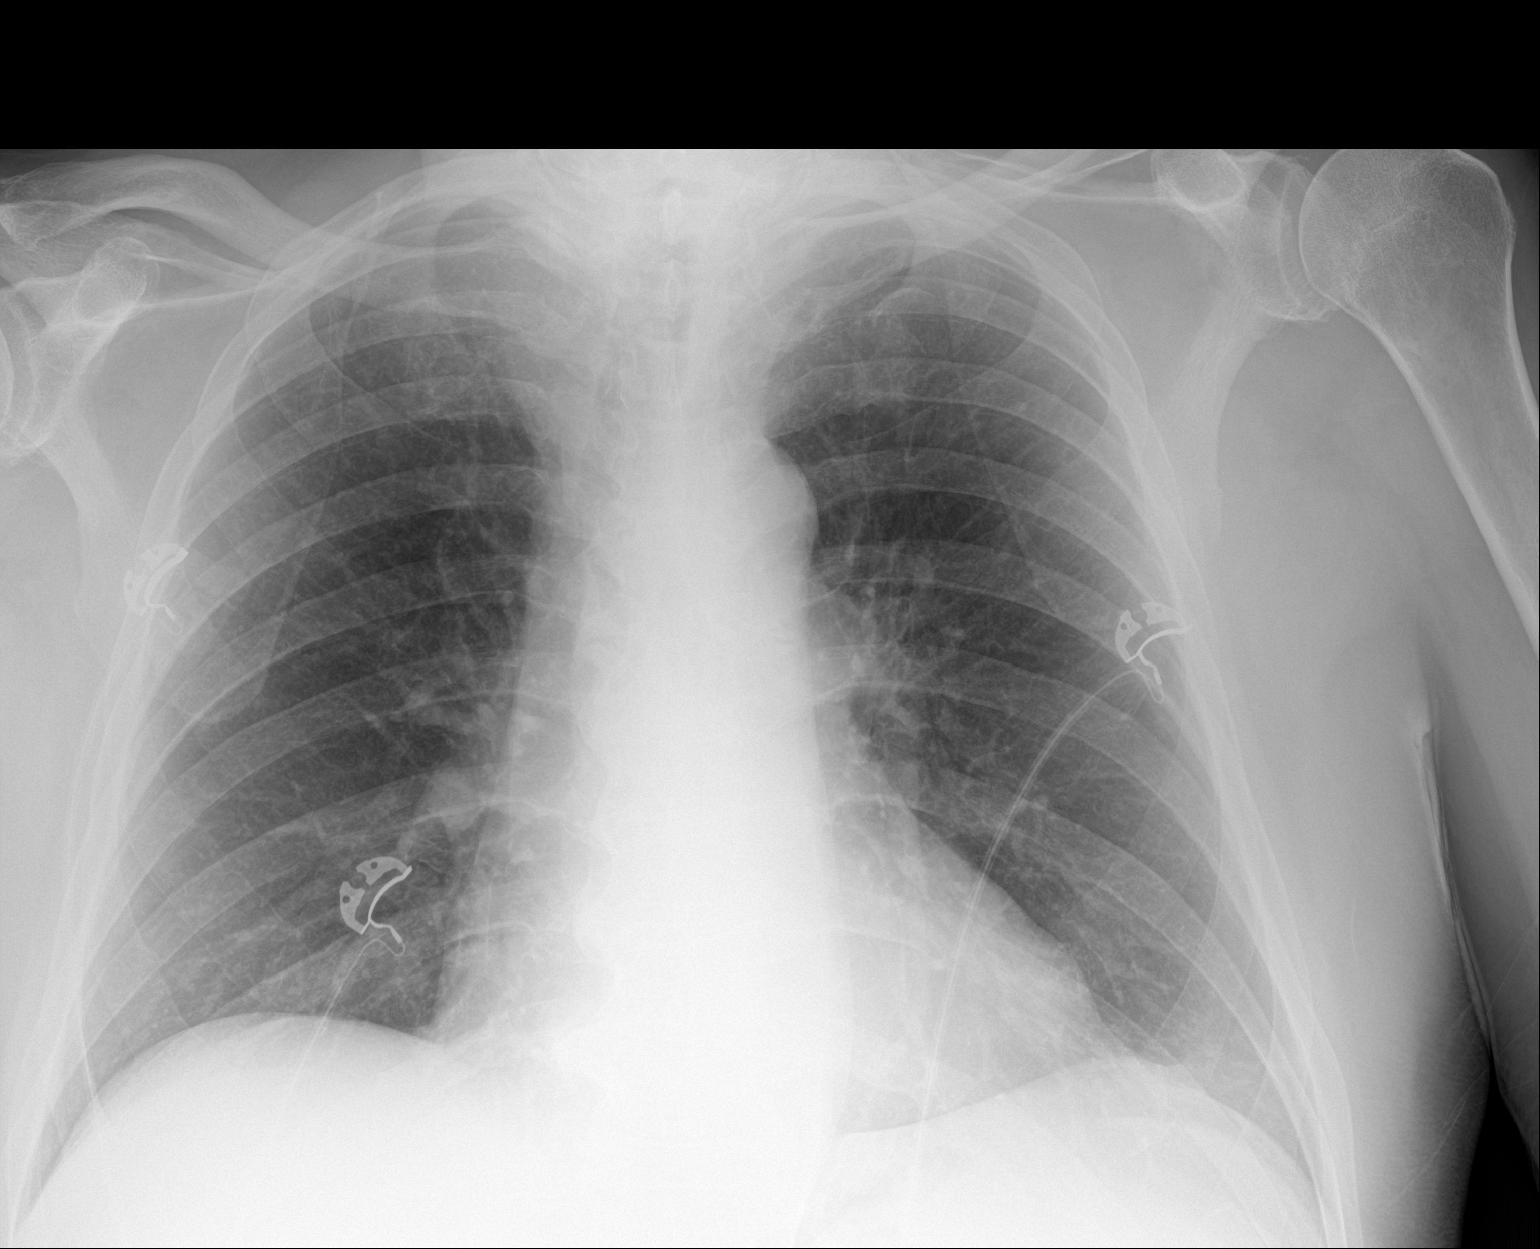

[1 of 1 positions shown; findings below may reference images not displayed]

FINDINGS: The heart size and mediastinal contours are within normal limits.
Both lungs are clear. The visualized skeletal structures are
unremarkable.
IMPRESSION: No active disease.

## 2022-09-02 ENCOUNTER — Telehealth: Payer: Self-pay | Admitting: Physician Assistant

## 2022-09-02 NOTE — Telephone Encounter (Signed)
Call received from [employer]  Back to back pauses at feb 25 at [place]  "3.0 sec then 3.9 sec separated by 4 beats" Rhythm prior to and after pauses was sinus  rhythm.  Will route to Dr. Gwenlyn Found. Monitor placed for bradycardia during office visit 07/29/22.

## 2022-09-08 ENCOUNTER — Ambulatory Visit
Payer: No Typology Code available for payment source | Attending: Cardiovascular Disease | Admitting: Cardiovascular Disease

## 2022-09-08 ENCOUNTER — Encounter: Payer: Self-pay | Admitting: Cardiovascular Disease

## 2022-09-08 ENCOUNTER — Telehealth: Payer: Self-pay | Admitting: Cardiovascular Disease

## 2022-09-08 VITALS — BP 126/72 | HR 56 | Ht 70.0 in | Wt 201.2 lb

## 2022-09-08 DIAGNOSIS — R001 Bradycardia, unspecified: Secondary | ICD-10-CM | POA: Diagnosis not present

## 2022-09-08 DIAGNOSIS — I471 Supraventricular tachycardia, unspecified: Secondary | ICD-10-CM | POA: Diagnosis not present

## 2022-09-08 NOTE — Telephone Encounter (Signed)
Called patient, he states they were unable to make the 3:30 PM, so he was rescheduled for the 2:15 PM spot- advised I would notify RN.  Thanks!

## 2022-09-08 NOTE — Telephone Encounter (Signed)
Error

## 2022-09-08 NOTE — Telephone Encounter (Signed)
Pt is calling to get results of heart monitor. He states that he was left a message today to schedule with Dr. Gwenlyn Found for today. If this is the case he would like it noted that he has another appt today elsewhere at 3:30 P.M. today. Requesting return call.

## 2022-09-08 NOTE — Patient Instructions (Signed)
Medication Instructions:  Your physician recommends that you continue on your current medications as directed. Please refer to the Current Medication list given to you today.  *If you need a refill on your cardiac medications before your next appointment, please call your pharmacy*   Follow-Up: At [facility], you and your health needs are our priority.  As part of our continuing mission to provide you with exceptional heart care, we have created designated Provider Care Teams.  These Care Teams include your primary Cardiologist (physician) and Advanced Practice Providers (APPs -  Physician Assistants and Nurse Practitioners) who all work together to provide you with the care you need, when you need it.  We recommend signing up for the patient portal called "MyChart".  Sign up information is provided on this After Visit Summary.  MyChart is used to connect with patients for Virtual Visits (Telemedicine).  Patients are able to view lab/test results, encounter notes, upcoming appointments, etc.  Non-urgent messages can be sent to your provider as well.   To learn more about what you can do with MyChart, go to [email or web address].    Your next appointment:   6 month(s)  Provider:   Jonathan Berry, MD    

## 2022-09-08 NOTE — Progress Notes (Signed)
Joel Stanley returns today for follow-up of his 2-week Zio patch done because of bradycardia.  He is on 50 mg metoprolol twice daily.  He says he is always had a "slow heart rate".  His monitor did show frequent PVCs with 1% burden, short runs of NSVT and SVT and pauses up to 4.7 seconds.  I was considering decreasing his beta-blocker dose but this may increase his PVC burden.  I am going to refer him to EP for further evaluation and treatment.  Lorretta Harp, M.D., [place], [facility], [place] [address] [place], [place]  [id number]  [phone] 09/08/2022 2:14 PM

## 2022-10-23 NOTE — Progress Notes (Deleted)
  Electrophysiology Office Note:    Date:  10/23/2022   ID:  Joel, Stanley 02/08/1941, MRN [id number]  [facility] Cardiologist:  Nanetta Batty, MD  [facility] Electrophysiologist:  Lanier Prude, MD   Referring MD: [name], MD   Chief Complaint: bradycardia    History of Present Illness:    Joel Stanley is a 82 y.o. male who I am seeing today for bradycardia at the request of Dr Allyson Sabal. He has a history of HTN, prior tobacco use, HLD, NIDDM and CAD.  He saw Dr Allyson Sabal 07/29/2022. At that visit he was bradycardic in the 40s. A zio monitor was ordered that showed HR 33 - 193 with an average rate of 60. He had occasional PVCs (1%).          Their past medical, social and family history was reveiwed.   ROS:   Please see the history of present illness.    All other systems reviewed and are negative.  EKGs/Labs/Other Studies Reviewed:    The following studies were reviewed today:  09/03/2022 Zio HR 33 - 193, average 60 bpm. Occasional PVC, 1%. 37 pauses, longest 4.7 seconds.  07/29/2022 ECG shows sinus bradycardia.   EKG:  The ekg ordered today demonstrates ***   Physical Exam:    VS:  There were no vitals taken for this visit.    Wt Readings from Last 3 Encounters:  09/08/22 201 lb 3.2 oz (91.3 kg)  07/29/22 201 lb 9.6 oz (91.4 kg)  05/20/22 207 lb 3.7 oz (94 kg)     GEN: *** Well nourished, well developed in no acute distress CARDIAC: ***RRR, no murmurs, rubs, gallops RESPIRATORY:  Clear to auscultation without rales, wheezing or rhonchi       ASSESSMENT AND PLAN:    No diagnosis found.  #Bradycardia Asymptomatic.  Recommend reducing metoprolol tartrate to 25mg  PO BID.  At this point I do not think a permanent pacemaker is indicated. We discussed indications for permanent pacemaker and the possibility that he may require a PPM at some point.   #PVC's Occasional on recent zio. Asymptomatic.    Follow up with EP on an  as needed basis.        Signed, Rossie Muskrat. [name], MD, [facility], [facility] 10/23/2022 7:26 PM    Electrophysiology [facility]

## 2022-10-24 ENCOUNTER — Ambulatory Visit: Payer: Medicare PPO | Attending: Cardiology | Admitting: Cardiology

## 2022-10-24 DIAGNOSIS — R001 Bradycardia, unspecified: Secondary | ICD-10-CM

## 2022-10-24 DIAGNOSIS — I493 Ventricular premature depolarization: Secondary | ICD-10-CM

## 2022-10-27 ENCOUNTER — Encounter: Payer: Self-pay | Admitting: Cardiology

## 2022-11-19 NOTE — Progress Notes (Unsigned)
Electrophysiology Office Note:    Date:  11/20/2022   ID:  Joel Stanley, Joel Stanley 13, 1942, MRN [id number]  [facility] Cardiologist:  Nanetta Batty, MD  [facility] Electrophysiologist:  Lanier Prude, MD   Referring MD: [name], MD   Chief Complaint: Bradycardia  History of Present Illness:    Joel Stanley is a 82 y.o. male who I am seeing today for an evaluation of bradycardia at the request of Dr. Gery Pray.  The patient was seen by Dr. Allyson Sabal September 08, 2022.  That appointment was to follow-up a ZIO monitor which demonstrated episodes of bradycardia and pauses up to 4.7 seconds.  The patient has a medical history that includes tobacco abuse, hypertension, hyperlipidemia, diabetes.  An appointment in February, Dr. Allyson Sabal ordered a ZIO monitor to further assess bradycardia. Today he reports general fatigue.  His wife confirms that his energy level has been low for quite some time.  He is unable to do the things he wants to do.  He thought it was due to age.  He tells me that his heart rates have always been fairly low but never this low.     Their past medical, social and family history was reveiwed.   ROS:   Please see the history of present illness.    All other systems reviewed and are negative.  EKGs/Labs/Other Studies Reviewed:    The following studies were reviewed today:  September 03, 2022 ZIO monitor personally reviewed Heart rate 33-1 93, average 60 Occasional ventricular ectopy, 1% 37 pauses, longest 4.7 seconds  July 29, 2022 EKG shows sinus bradycardia  EKG:  The ekg ordered today demonstrates sinus bradycardia with a ventricular rate of 29 bpm.  Narrow QRS.  PR interval is 222 ms.   Physical Exam:    VS:  BP 132/78   Pulse (!) 29   Ht 5\' 10"  (1.778 m)   Wt 200 lb 9.6 oz (91 kg)   SpO2 96%   BMI 28.78 kg/m     Wt Readings from Last 3 Encounters:  11/20/22 200 lb 9.6 oz (91 kg)  09/08/22 201 lb 3.2 oz (91.3 kg)  07/29/22 201 lb  9.6 oz (91.4 kg)     GEN:  Well nourished, well developed in no acute distress CARDIAC: Regular rhythm, bradycardic, no murmurs, rubs, gallops RESPIRATORY:  Clear to auscultation without rales, wheezing or rhonchi       ASSESSMENT AND PLAN:    1. Sinus bradycardia   2. Sinus pause   3. Coronary artery disease involving native coronary artery of native heart without angina pectoris   4. Primary hypertension     #Sinus bradycardia #Sinus node dysfunction #Pauses Asymptomatic.  In the setting of moderate dose metoprolol Stop metoprolol. Needs to be seen on Monday in clinic for repeat EKG.  If his heart rates are still low despite being off of metoprolol, he will need a permanent pacemaker. He is not to drive or operate heavy machinery until his workup is completed. If he were to proceed with pacemaker implant, plan would be to hold his Plavix for 5 days.  His prior coronary interventions were in 2016, 2009 and 2012.  #Hypertension At goal today.  Recommend checking blood pressures 1-2 times per week at home and recording the values.  Recommend bringing these recordings to the primary care physician.   #Coronary artery disease No ischemic symptoms today  Follow-up on Monday with APP.    Signed, Rossie Muskrat. [name], MD,  [facility], [facility] 11/20/2022 10:24 AM    Electrophysiology [facility]

## 2022-11-20 ENCOUNTER — Ambulatory Visit: Payer: Medicare PPO | Attending: Cardiology | Admitting: Cardiology

## 2022-11-20 ENCOUNTER — Encounter: Payer: Self-pay | Admitting: Cardiology

## 2022-11-20 VITALS — BP 132/78 | HR 29 | Ht 70.0 in | Wt 200.6 lb

## 2022-11-20 DIAGNOSIS — I455 Other specified heart block: Secondary | ICD-10-CM | POA: Diagnosis not present

## 2022-11-20 DIAGNOSIS — I2510 Atherosclerotic heart disease of native coronary artery without angina pectoris: Secondary | ICD-10-CM

## 2022-11-20 DIAGNOSIS — I10 Essential (primary) hypertension: Secondary | ICD-10-CM | POA: Diagnosis not present

## 2022-11-20 DIAGNOSIS — R001 Bradycardia, unspecified: Secondary | ICD-10-CM | POA: Diagnosis not present

## 2022-11-20 NOTE — Patient Instructions (Addendum)
DO NOT OPERATE OR DRIVE HEAVY MACHINERY UNTIL WORKUP IS COMPLETE  Medication Instructions:  Your physician has recommended you make the following change in your medication:  1) STOP taking metoprolol  *If you need a refill on your cardiac medications before your next appointment, please call your pharmacy*  Follow-Up: At [facility], you and your health needs are our priority.  As part of our continuing mission to provide you with exceptional heart care, we have created designated Provider Care Teams.  These Care Teams include your primary Cardiologist (physician) and Advanced Practice Providers (APPs -  Physician Assistants and Nurse Practitioners) who all work together to provide you with the care you need, when you need it.  Your next appointment:   Monday 6/3 at 2:45pm  Provider:   Jari Favre, PA-C

## 2022-11-24 ENCOUNTER — Ambulatory Visit: Payer: No Typology Code available for payment source | Attending: Physician Assistant | Admitting: Physician Assistant

## 2022-11-24 ENCOUNTER — Encounter: Payer: Self-pay | Admitting: Physician Assistant

## 2022-11-24 VITALS — BP 138/80 | HR 74 | Ht 70.0 in | Wt 199.4 lb

## 2022-11-24 DIAGNOSIS — E1159 Type 2 diabetes mellitus with other circulatory complications: Secondary | ICD-10-CM | POA: Diagnosis not present

## 2022-11-24 DIAGNOSIS — E782 Mixed hyperlipidemia: Secondary | ICD-10-CM | POA: Diagnosis not present

## 2022-11-24 DIAGNOSIS — I10 Essential (primary) hypertension: Secondary | ICD-10-CM | POA: Diagnosis not present

## 2022-11-24 DIAGNOSIS — I2510 Atherosclerotic heart disease of native coronary artery without angina pectoris: Secondary | ICD-10-CM

## 2022-11-24 DIAGNOSIS — I152 Hypertension secondary to endocrine disorders: Secondary | ICD-10-CM

## 2022-11-24 DIAGNOSIS — I455 Other specified heart block: Secondary | ICD-10-CM

## 2022-11-24 DIAGNOSIS — R001 Bradycardia, unspecified: Secondary | ICD-10-CM

## 2022-11-24 DIAGNOSIS — Z7984 Long term (current) use of oral hypoglycemic drugs: Secondary | ICD-10-CM

## 2022-11-24 NOTE — Patient Instructions (Signed)
Medication Instructions:  Your physician recommends that you continue on your current medications as directed. Please refer to the Current Medication list given to you today.  *If you need a refill on your cardiac medications before your next appointment, please call your pharmacy*  Lab Work: None ordered If you have labs (blood work) drawn today and your tests are completely normal, you will receive your results only by: MyChart Message (if you have MyChart) OR A paper copy in the mail If you have any lab test that is abnormal or we need to change your treatment, we will call you to review the results.   Follow-Up: At [facility], you and your health needs are our priority.  As part of our continuing mission to provide you with exceptional heart care, we have created designated Provider Care Teams.  These Care Teams include your primary Cardiologist (physician) and Advanced Practice Providers (APPs -  Physician Assistants and Nurse Practitioners) who all work together to provide you with the care you need, when you need it.  We recommend signing up for the patient portal called "MyChart".  Sign up information is provided on this After Visit Summary.  MyChart is used to connect with patients for Virtual Visits (Telemedicine).  Patients are able to view lab/test results, encounter notes, upcoming appointments, etc.  Non-urgent messages can be sent to your provider as well.   To learn more about what you can do with MyChart, go to [email or web address].    Your next appointment:   Follow up with [facility] cardiology  Low-Sodium Eating Plan Salt (sodium) helps you keep a healthy balance of fluids in your body. Too much sodium can raise your blood pressure. It can also cause fluid and waste to be held in your body. Your health care provider or dietitian may recommend a low-sodium eating plan if you have high blood pressure (hypertension), kidney disease, liver disease, or heart failure.  Eating less sodium can help lower your blood pressure and reduce swelling. It can also protect your heart, liver, and kidneys. What are tips for following this plan? Reading food labels  Check food labels for the amount of sodium per serving. If you eat more than one serving, you must multiply the listed amount by the number of servings. Choose foods with less than 140 milligrams (mg) of sodium per serving. Avoid foods with 300 mg of sodium or more per serving. Always check how much sodium is in a product, even if the label says "unsalted" or "no salt added." Shopping  Buy products labeled as "low-sodium" or "no salt added." Buy fresh foods. Avoid canned foods and pre-made or frozen meals. Avoid canned, cured, or processed meats. Buy breads that have less than 80 mg of sodium per slice. Cooking  Eat more home-cooked food. Try to eat less restaurant, buffet, and fast food. Try not to add salt when you cook. Use salt-free seasonings or herbs instead of table salt or sea salt. Check with your provider or pharmacist before using salt substitutes. Cook with plant-based oils, such as canola, sunflower, or olive oil. Meal planning When eating at a restaurant, ask if your food can be made with less salt or no salt. Avoid dishes labeled as brined, pickled, cured, or smoked. Avoid dishes made with soy sauce, miso, or teriyaki sauce. Avoid foods that have monosodium glutamate (MSG) in them. MSG may be added to some restaurant food, sauces, soups, bouillon, and canned foods. Make meals that can be grilled, baked, poached, roasted,  or steamed. These are often made with less sodium. General information Try to limit your sodium intake to 1,500-2,300 mg each day, or the amount told by your provider. What foods should I eat? Fruits Fresh, frozen, or canned fruit. Fruit juice. Vegetables Fresh or frozen vegetables. "No salt added" canned vegetables. "No salt added" tomato sauce and paste. Low-sodium or  reduced-sodium tomato and vegetable juice. Grains Low-sodium cereals, such as oats, puffed wheat and rice, and shredded wheat. Low-sodium crackers. Unsalted rice. Unsalted pasta. Low-sodium bread. Whole grain breads and whole grain pasta. Meats and other proteins Fresh or frozen meat, poultry, seafood, and fish. These should have no added salt. Low-sodium canned tuna and salmon. Unsalted nuts. Dried peas, beans, and lentils without added salt. Unsalted canned beans. Eggs. Unsalted nut butters. Dairy Milk. Soy milk. Cheese that is naturally low in sodium, such as ricotta cheese, fresh mozzarella, or Swiss cheese. Low-sodium or reduced-sodium cheese. Cream cheese. Yogurt. Seasonings and condiments Fresh and dried herbs and spices. Salt-free seasonings. Low-sodium mustard and ketchup. Sodium-free salad dressing. Sodium-free light mayonnaise. Fresh or refrigerated horseradish. Lemon juice. Vinegar. Other foods Homemade, reduced-sodium, or low-sodium soups. Unsalted popcorn and pretzels. Low-salt or salt-free chips. The items listed above may not be all the foods and drinks you can have. Talk to a dietitian to learn more. What foods should I avoid? Vegetables Sauerkraut, pickled vegetables, and relishes. Olives. [place] fries. Onion rings. Regular canned vegetables, except low-sodium or reduced-sodium items. Regular canned tomato sauce and paste. Regular tomato and vegetable juice. Frozen vegetables in sauces. Grains Instant hot cereals. Bread stuffing, pancake, and biscuit mixes. Croutons. Seasoned rice or pasta mixes. Noodle soup cups. Boxed or frozen macaroni and cheese. Regular salted crackers. Self-rising flour. Meats and other proteins Meat or fish that is salted, canned, smoked, spiced, or pickled. Precooked or cured meat, such as sausages or meat loaves. Tomasa Blase. Ham. Pepperoni. Hot dogs. Corned beef. Chipped beef. Salt pork. Jerky. Pickled herring, anchovies, and sardines. Regular canned tuna.  Salted nuts. Dairy Processed cheese and cheese spreads. Hard cheeses. Cheese curds. Blue cheese. Feta cheese. String cheese. Regular cottage cheese. Buttermilk. Canned milk. Fats and oils Salted butter. Regular margarine. Ghee. Bacon fat. Seasonings and condiments Onion salt, garlic salt, seasoned salt, table salt, and sea salt. Canned and packaged gravies. Worcestershire sauce. Tartar sauce. Barbecue sauce. Teriyaki sauce. Soy sauce, including reduced-sodium soy sauce. Steak sauce. Fish sauce. Oyster sauce. Cocktail sauce. Horseradish that you find on the shelf. Regular ketchup and mustard. Meat flavorings and tenderizers. Bouillon cubes. Hot sauce. Pre-made or packaged marinades. Pre-made or packaged taco seasonings. Relishes. Regular salad dressings. Salsa. Other foods Salted popcorn and pretzels. Corn chips and puffs. Potato and tortilla chips. Canned or dried soups. Pizza. Frozen entrees and pot pies. The items listed above may not be all the foods and drinks you should avoid. Talk to a dietitian to learn more. This information is not intended to replace advice given to you by your health care provider. Make sure you discuss any questions you have with your health care provider. Document Revised: 06/26/2022 Document Reviewed: 06/26/2022 Elsevier Patient Education  2024 [employer]  Heart-Healthy Eating Plan Many factors influence your heart health, including eating and exercise habits. Heart health is also called coronary health. Coronary risk increases with abnormal blood fat (lipid) levels. A heart-healthy eating plan includes limiting unhealthy fats, increasing healthy fats, limiting salt (sodium) intake, and making other diet and lifestyle changes. What is my plan? Your health care provider may recommend  that: You limit your fat intake to _________% or less of your total calories each day. You limit your saturated fat intake to _________% or less of your total calories each day. You  limit the amount of cholesterol in your diet to less than _________ mg per day. You limit the amount of sodium in your diet to less than _________ mg per day. What are tips for following this plan? Cooking Cook foods using methods other than frying. Baking, boiling, grilling, and broiling are all good options. Other ways to reduce fat include: Removing the skin from poultry. Removing all visible fats from meats. Steaming vegetables in water or broth. Meal planning  At meals, imagine dividing your plate into fourths: Fill one-half of your plate with vegetables and green salads. Fill one-fourth of your plate with whole grains. Fill one-fourth of your plate with lean protein foods. Eat 2-4 cups of vegetables per day. One cup of vegetables equals 1 cup (91 g) broccoli or cauliflower florets, 2 medium carrots, 1 large bell pepper, 1 large sweet potato, 1 large tomato, 1 medium white potato, 2 cups (150 g) raw leafy greens. Eat 1-2 cups of fruit per day. One cup of fruit equals 1 small apple, 1 large banana, 1 cup (237 g) mixed fruit, 1 large orange,  cup (82 g) dried fruit, 1 cup (240 mL) 100% fruit juice. Eat more foods that contain soluble fiber. Examples include apples, broccoli, carrots, beans, peas, and barley. Aim to get 25-30 g of fiber per day. Increase your consumption of legumes, nuts, and seeds to 4-5 servings per week. One serving of dried beans or legumes equals  cup (90 g) cooked, 1 serving of nuts is  oz (12 almonds, 24 pistachios, or 7 walnut halves), and 1 serving of seeds equals  oz (8 g). Fats Choose healthy fats more often. Choose monounsaturated and polyunsaturated fats, such as olive and canola oils, avocado oil, flaxseeds, walnuts, almonds, and seeds. Eat more omega-3 fats. Choose salmon, mackerel, sardines, tuna, flaxseed oil, and ground flaxseeds. Aim to eat fish at least 2 times each week. Check food labels carefully to identify foods with trans fats or high amounts  of saturated fat. Limit saturated fats. These are found in animal products, such as meats, butter, and cream. Plant sources of saturated fats include palm oil, palm kernel oil, and coconut oil. Avoid foods with partially hydrogenated oils in them. These contain trans fats. Examples are stick margarine, some tub margarines, cookies, crackers, and other baked goods. Avoid fried foods. General information Eat more home-cooked food and less restaurant, buffet, and fast food. Limit or avoid alcohol. Limit foods that are high in added sugar and simple starches such as foods made using white refined flour (white breads, pastries, sweets). Lose weight if you are overweight. Losing just 5-10% of your body weight can help your overall health and prevent diseases such as diabetes and heart disease. Monitor your sodium intake, especially if you have high blood pressure. Talk with your health care provider about your sodium intake. Try to incorporate more vegetarian meals weekly. What foods should I eat? Fruits All fresh, canned (in natural juice), or frozen fruits. Vegetables Fresh or frozen vegetables (raw, steamed, roasted, or grilled). Green salads. Grains Most grains. Choose whole wheat and whole grains most of the time. Rice and pasta, including brown rice and pastas made with whole wheat. Meats and other proteins Lean, well-trimmed beef, veal, pork, and lamb. Chicken and [place] without skin. All fish and shellfish.  Wild duck, rabbit, pheasant, and venison. Egg whites or low-cholesterol egg substitutes. Dried beans, peas, lentils, and tofu. Seeds and most nuts. Dairy Low-fat or nonfat cheeses, including ricotta and mozzarella. Skim or 1% milk (liquid, powdered, or evaporated). Buttermilk made with low-fat milk. Nonfat or low-fat yogurt. Fats and oils Non-hydrogenated (trans-free) margarines. Vegetable oils, including soybean, sesame, sunflower, olive, avocado, peanut, safflower, corn, canola, and  cottonseed. Salad dressings or mayonnaise made with a vegetable oil. Beverages Water (mineral or sparkling). Coffee and tea. Unsweetened ice tea. Diet beverages. Sweets and desserts Sherbet, gelatin, and fruit ice. Small amounts of dark chocolate. Limit all sweets and desserts. Seasonings and condiments All seasonings and condiments. The items listed above may not be a complete list of foods and beverages you can eat. Contact a dietitian for more options. What foods should I avoid? Fruits Canned fruit in heavy syrup. Fruit in cream or butter sauce. Fried fruit. Limit coconut. Vegetables Vegetables cooked in cheese, cream, or butter sauce. Fried vegetables. Grains Breads made with saturated or trans fats, oils, or whole milk. Croissants. Sweet rolls. Donuts. High-fat crackers, such as cheese crackers and chips. Meats and other proteins Fatty meats, such as hot dogs, ribs, sausage, bacon, rib-eye roast or steak. High-fat deli meats, such as salami and bologna. Caviar. Domestic duck and goose. Organ meats, such as liver. Dairy Cream, sour cream, cream cheese, and creamed cottage cheese. Whole-milk cheeses. Whole or 2% milk (liquid, evaporated, or condensed). Whole buttermilk. Cream sauce or high-fat cheese sauce. Whole-milk yogurt. Fats and oils Meat fat, or shortening. Cocoa butter, hydrogenated oils, palm oil, coconut oil, palm kernel oil. Solid fats and shortenings, including bacon fat, salt pork, lard, and butter. Nondairy cream substitutes. Salad dressings with cheese or sour cream. Beverages Regular sodas and any drinks with added sugar. Sweets and desserts Frosting. Pudding. Cookies. Cakes. Pies. Milk chocolate or white chocolate. Buttered syrups. Full-fat ice cream or ice cream drinks. The items listed above may not be a complete list of foods and beverages to avoid. Contact a dietitian for more information. Summary Heart-healthy meal planning includes limiting unhealthy fats,  increasing healthy fats, limiting salt (sodium) intake and making other diet and lifestyle changes. Lose weight if you are overweight. Losing just 5-10% of your body weight can help your overall health and prevent diseases such as diabetes and heart disease. Focus on eating a balance of foods, including fruits and vegetables, low-fat or nonfat dairy, lean protein, nuts and legumes, whole grains, and heart-healthy oils and fats. This information is not intended to replace advice given to you by your health care provider. Make sure you discuss any questions you have with your health care provider. Document Revised: 07/15/2021 Document Reviewed: 07/15/2021 Elsevier Patient Education  2024 [employer].

## 2022-11-24 NOTE — Progress Notes (Signed)
Office Visit    Patient Name: Joel Stanley Date of Encounter: 11/24/2022  PCP:  Administration, [facility]  Cardiologist:  Nanetta Batty, MD  Advanced Practice Provider:  No care team member to display Electrophysiologist:  Lanier Prude, MD   HPI    Joel Stanley is a 82 y.o. male with a past medical history of bradycardia, coronary artery disease, family history of heart disease, hypertension, hyperlipidemia, OSA, type 2 diabetes mellitus presents today for follow-up appointment.  Patient was seen by Dr. Gery Pray September 08, 2022.  The appointment was to follow-up with his ZIO monitor which demonstrated episodes of bradycardia and pauses up to 4.7 seconds.  Patient had medical history included tobacco abuse.  Appointment in February with Dr. Gery Pray offered a ZIO monitor to further assess bradycardia.  He was seen 11/20/2022 by Dr. [name] for further evaluation.  He reported fatigue that appointment.  Confirms that energy levels have been low for quite some time.  Unable to do things he wants to do.  He thought it was perhaps due to age.  Tells me heart rates have always been fairly low but never this low.  Metoprolol was stopped.  Plan for repeat EKG in the clinic Monday (today).  If heart rates remain low despite being off metoprolol, will need a PPM.  If PPM is needed, plan will be to hold his Plavix for 5 days prior to implantation.  Today, he comes in for EKG evaluation.  Currently his heart rate is 74 bpm.  He feels much better and blood pressure is better controlled at this time.  Heart rates were as low as 29 in the past on metoprolol.  He has since discontinued this medication.  Compliant with other medications.  He tells me he typically follows with the [place].  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  It does not appear that he needs permanent pacemaker at this time.  Reports no shortness of breath nor dyspnea on exertion. Reports no chest  pain, pressure, or tightness. No edema, orthopnea, PND. Reports no palpitations.  Past Medical History    Past Medical History:  Diagnosis Date   Coronary artery disease    Exertional angina    Family history of heart disease    Hyperlipidemia    Hypertension    Obstructive sleep apnea    Type 2 diabetes mellitus (HCC)    Past Surgical History:  Procedure Laterality Date   APPENDECTOMY  1969   CARDIAC CATHETERIZATION  08/21/2010   PLA 75% fairly focal "in-stent" restenosis-cutting balloon atherectomy performed, expanded to 10 atm, resutling in reductin to 20% without dissection. PDA 90% proximal stenosis-stented with a 2.5x61mm Promus Element stent, deployed at 14 atm (2.55mm, resulting in reduction of 90% to 0% residual).   CARDIAC CATHETERIZATION  05/03/2008   PLA 100% occluded proximal stenosis-stented with a 2.75x58mm Promus stent deployed at 14 atm, resulting in reduction of 100% to 0% residual   CARDIAC CATHETERIZATION  01/27/2005   RCA 80-85% stenosis, stenting performed with a 3.5x9mm Taxus DES, dilated to 3.75 with a 0% residual.   CARDIOVASCULAR STRESS TEST  07/16/2009   Mild-moderate perfusion defect seen in Basal inferior and Mid inferior regions. No scitigraphic evidence of inducible myocardial ischemia. No ECG changes. EKG negative for ischemia. Post-stress EF 56%.   CHOLECYSTECTOMY  1985   HERNIA REPAIR  1993, 2003   LEFT HEART CATH AND CORONARY ANGIOGRAPHY N/A 12/14/2017  Procedure: LEFT HEART CATH AND CORONARY ANGIOGRAPHY;  Surgeon: [name], MD;  Location: [facility];  Service: Cardiovascular;  Laterality: N/A;   TONSILLECTOMY  1946   TOTAL KNEE ARTHROPLASTY Left 2004    Allergies  Allergies  Allergen Reactions   Yellow Jacket Venom    Yellow Jacket Venom     EKGs/Labs/Other Studies Reviewed:   The following studies were reviewed today: Cardiac Studies & Procedures   CARDIAC CATHETERIZATION  CARDIAC CATHETERIZATION  12/14/2017  Narrative  Mid LAD lesion is 100% stenosed.  Dist RCA lesion is 40% stenosed.  Previously placed Ost RPDA to RPDA stent (unknown type) is widely patent.  Previously placed Post Atrio stent (unknown type) is widely patent.  There is mild to moderate left ventricular systolic dysfunction.  LV end diastolic pressure is mildly elevated.  The left ventricular ejection fraction is 45-50% by visual estimate.  Joel Stanley is a 82 y.o. male   [id number] LOCATION:  FACILITY: [facility] PHYSICIAN: Nanetta Batty, M.D. Dec 26, 1940   DATE OF PROCEDURE:  12/14/2017  DATE OF DISCHARGE:     CARDIAC CATHETERIZATION    History obtained from chart review.82 y.o. male with PMH of CAD, PAD, OSA, DM II, HTN, and HLD presented with chest pain. Trop negative.  I last cath him in 2012 and performed intervention on his PDA and PLA.  He does have a known occluded mid to distal LAD with homocollaterals and moderate LV dysfunction with inferobasal hypokinesia.  He was admitted with chest pain.  Enzymes were negative.  He presents now for diagnostic coronary angiography.  Impression Joel Stanley has no identifiable culprit lesions.  The PDA and PLA stents were widely patent.  His mid LAD was occluded as it was 7 years ago with homocollaterals.  The circumflex and ramus branch were widely patent.  His EF was in the 40 to 45% range with moderate inferobasal hypokinesia, unchanged from his last cath.  The sheath was removed and a TR band was placed on the right wrist to achieve patent hemostasis.  The patient left the lab in stable condition.  Medical therapy will be recommended.  I will see him back as an outpatient.  Nanetta Batty. MD, [facility] 12/14/2017 3:06 PM  Findings Coronary Findings Diagnostic  Dominance: Right  Left Anterior Descending Collaterals Mid LAD filled by collaterals from Mid LAD.  Collaterals Mid LAD filled by collaterals from Mid LAD.  Mid LAD lesion is 100%  stenosed.  Right Coronary Artery Dist RCA lesion is 40% stenosed.  Right Posterior Descending Artery Previously placed Ost RPDA to RPDA stent (unknown type) is widely patent.  Right Posterior Atrioventricular Artery Previously placed Post Atrio stent (unknown type) is widely patent.  Intervention  No interventions have been documented.   STRESS TESTS  NM MYOCAR MULTI W/SPECT W 07/12/2014     MONITORS  LONG TERM MONITOR (3-14 DAYS) 09/03/2022  Narrative Patch Wear Time:  13 days and 10 hours (2024-02-15T20:00:23-0500 to 2024-02-29T06:30:27-0500)  Patient had a min HR of 33 bpm, max HR of 193 bpm, and avg HR of 60 bpm. Predominant underlying rhythm was Sinus Rhythm. 1 run of Ventricular Tachycardia occurred lasting 4 beats with a max rate of 193 bpm (avg 185 bpm). 17 Supraventricular Tachycardia runs occurred, the run with the fastest interval lasting 10 beats with a max rate of 139 bpm, the longest lasting 16 beats with an avg rate of 113 bpm. 37 Pauses occurred, the longest lasting 4.7 secs (13 bpm). Some episodes  of Supraventricular Tachycardia may be possible Atrial Tachycardia with variable block. Isolated SVEs were rare (<1.0%), SVE Couplets were rare (<1.0%), and SVE Triplets were rare (<1.0%). Isolated VEs were occasional (1.0%, 10817), VE Couplets were rare (<1.0%, 65), and no VE Triplets were present. Ventricular Bigeminy and Trigeminy were present. MD notification criteria for Pauses met - report posted prior to notification per account request (VI).  SR/SB/ST Occasional PACs, frequent PVCs (1% burden) Short runs of NSVT Short runs of SVT Pauses up to 4.7 seconds Needs return office visit            EKG:  EKG is  ordered today.  The ekg ordered today demonstrates normal sinus rhythm, rate 74 bpm.  Recent Labs: 05/20/2022: ALT 15; BUN 17; Creatinine, Ser 1.24; Hemoglobin 15.8; Platelets 227; Potassium 3.7; Sodium 135  Recent Lipid Panel    Component Value Date/Time    CHOL  08/22/2010 0355    174        ATP III CLASSIFICATION:  <200     mg/dL   Desirable  161-096  mg/dL   Borderline High  >=045    mg/dL   High          TRIG 409 06/14/2020 1634   HDL 28 (L) 08/22/2010 0355   CHOLHDL 6.2 08/22/2010 0355   VLDL 53 (H) 08/22/2010 0355   LDLCALC  08/22/2010 0355    93        Total Cholesterol/HDL:CHD Risk Coronary Heart Disease Risk Table                     Men   Women  1/2 Average Risk   3.4   3.3  Average Risk       5.0   4.4  2 X Average Risk   9.6   7.1  3 X Average Risk  23.4   11.0        Use the calculated Patient Ratio above and the CHD Risk Table to determine the patient's CHD Risk.        ATP III CLASSIFICATION (LDL):  <100     mg/dL   Optimal  811-914  mg/dL   Near or Above                    Optimal  130-159  mg/dL   Borderline  782-956  mg/dL   High  >213     mg/dL   Very High    Home Medications   Current Meds  Medication Sig   acetaminophen (TYLENOL) 650 MG CR tablet Take 650 mg by mouth as needed.   aspirin 81 MG chewable tablet Chew 1 tablet (81 mg total) by mouth daily. (Patient taking differently: Chew 81 mg by mouth as needed.)   Cholecalciferol (VITAMIN D-3) 1000 UNITS CAPS Take 2,000 Units by mouth daily.   clopidogrel (PLAVIX) 75 MG tablet Take 75 mg by mouth daily.   EPINEPHrine 0.3 mg/0.3 mL IJ SOAJ injection    famotidine (PEPCID) 20 MG tablet Take 1 tablet by mouth 2 (two) times daily.   lisinopril (ZESTRIL) 40 MG tablet Take 1 tablet (40 mg total) by mouth daily.   metFORMIN (GLUCOPHAGE) 500 MG tablet Take 1 tablet (500 mg total) by mouth 2 (two) times daily with a meal. (Patient taking differently: Take 1,000 mg by mouth 2 (two) times daily with a meal.)   nitroGLYCERIN (NITROSTAT) 0.4 MG SL tablet Place 1 tablet (0.4 mg total) under the tongue  every 5 (five) minutes x 3 doses as needed for chest pain.   rosuvastatin (CRESTOR) 10 MG tablet Take 10 mg by mouth every evening.     Review of Systems       All other systems reviewed and are otherwise negative except as noted above.  Physical Exam    VS:  BP 138/80   Pulse 74   Ht 5\' 10"  (1.778 m)   Wt 199 lb 6.4 oz (90.4 kg)   SpO2 96%   BMI 28.61 kg/m  , BMI Body mass index is 28.61 kg/m.  Wt Readings from Last 3 Encounters:  11/24/22 199 lb 6.4 oz (90.4 kg)  11/20/22 200 lb 9.6 oz (91 kg)  09/08/22 201 lb 3.2 oz (91.3 kg)     GEN: Well nourished, well developed, in no acute distress. HEENT: normal. Neck: Supple, no JVD, carotid bruits, or masses. Cardiac: RRR, no murmurs, rubs, or gallops. No clubbing, cyanosis, edema.  Radials/PT 2+ and equal bilaterally.  Respiratory:  Respirations regular and unlabored, clear to auscultation bilaterally. GI: Soft, nontender, nondistended. MS: No deformity or atrophy. Skin: Warm and dry, no rash. Neuro:  Strength and sensation are intact. Psych: Normal affect.  Assessment & Plan    Sinus bradycardia/sinus node dysfunction/pauses -EKG shows normal sinus rhythm, rate 74 bpm, minimal LVH -Continue current medications which include aspirin 81 mg daily, Plavix 75 mg daily, lisinopril 40 mg daily, nitro as needed, Crestor 10 mg daily -Discontinued metoprolol  Hypertension -BP well controlled  -Continue current medications -Continue heart healthy, low-sodium diet -Asked the patient to monitor his blood pressure an hour after morning medications and record, please send to [facility] cardiologist  Hyperlipidemia -please fax lipid panel and LFTs from the [place] so we can scan and these results -For now, continue current medication with Crestor 10 mg daily  CAD -no chest pain -Continue current medications  5. Diabetes Mellitus -A1c was 6.6 but this resolved several years old -Per patient most recent A1c is well-controlled (done at the [place])       Disposition: Follow up 2-3 months with Nanetta Batty, MD or APP. ([facility])  Signed, Sharlene Dory,  PA-C 11/24/2022, 3:33 PM [facility]

## 2023-01-31 ENCOUNTER — Emergency Department (HOSPITAL_COMMUNITY): Payer: No Typology Code available for payment source

## 2023-01-31 ENCOUNTER — Encounter (HOSPITAL_COMMUNITY): Payer: Self-pay | Admitting: Internal Medicine

## 2023-01-31 ENCOUNTER — Inpatient Hospital Stay (HOSPITAL_COMMUNITY)
Admission: EM | Admit: 2023-01-31 | Discharge: 2023-02-06 | DRG: 871 | Disposition: A | Discharge status: Home Health | Payer: No Typology Code available for payment source | Attending: Family Medicine | Admitting: Family Medicine

## 2023-01-31 DIAGNOSIS — E119 Type 2 diabetes mellitus without complications: Secondary | ICD-10-CM | POA: Diagnosis not present

## 2023-01-31 DIAGNOSIS — Z96652 Presence of left artificial knee joint: Secondary | ICD-10-CM | POA: Diagnosis present

## 2023-01-31 DIAGNOSIS — I129 Hypertensive chronic kidney disease with stage 1 through stage 4 chronic kidney disease, or unspecified chronic kidney disease: Secondary | ICD-10-CM | POA: Diagnosis present

## 2023-01-31 DIAGNOSIS — G9341 Metabolic encephalopathy: Secondary | ICD-10-CM | POA: Diagnosis present

## 2023-01-31 DIAGNOSIS — E860 Dehydration: Secondary | ICD-10-CM | POA: Diagnosis present

## 2023-01-31 DIAGNOSIS — Z7902 Long term (current) use of antithrombotics/antiplatelets: Secondary | ICD-10-CM

## 2023-01-31 DIAGNOSIS — R001 Bradycardia, unspecified: Secondary | ICD-10-CM | POA: Diagnosis present

## 2023-01-31 DIAGNOSIS — N182 Chronic kidney disease, stage 2 (mild): Secondary | ICD-10-CM | POA: Insufficient documentation

## 2023-01-31 DIAGNOSIS — A419 Sepsis, unspecified organism: Secondary | ICD-10-CM | POA: Diagnosis not present

## 2023-01-31 DIAGNOSIS — Z91038 Other insect allergy status: Secondary | ICD-10-CM

## 2023-01-31 DIAGNOSIS — E871 Hypo-osmolality and hyponatremia: Secondary | ICD-10-CM | POA: Insufficient documentation

## 2023-01-31 DIAGNOSIS — U071 COVID-19: Secondary | ICD-10-CM

## 2023-01-31 DIAGNOSIS — M19071 Primary osteoarthritis, right ankle and foot: Secondary | ICD-10-CM | POA: Diagnosis present

## 2023-01-31 DIAGNOSIS — Z7984 Long term (current) use of oral hypoglycemic drugs: Secondary | ICD-10-CM | POA: Diagnosis not present

## 2023-01-31 DIAGNOSIS — Z8679 Personal history of other diseases of the circulatory system: Secondary | ICD-10-CM

## 2023-01-31 DIAGNOSIS — E785 Hyperlipidemia, unspecified: Secondary | ICD-10-CM | POA: Diagnosis present

## 2023-01-31 DIAGNOSIS — Z794 Long term (current) use of insulin: Secondary | ICD-10-CM

## 2023-01-31 DIAGNOSIS — Z87891 Personal history of nicotine dependence: Secondary | ICD-10-CM

## 2023-01-31 DIAGNOSIS — Z79899 Other long term (current) drug therapy: Secondary | ICD-10-CM

## 2023-01-31 DIAGNOSIS — I2510 Atherosclerotic heart disease of native coronary artery without angina pectoris: Secondary | ICD-10-CM | POA: Diagnosis present

## 2023-01-31 DIAGNOSIS — G4733 Obstructive sleep apnea (adult) (pediatric): Secondary | ICD-10-CM | POA: Diagnosis present

## 2023-01-31 DIAGNOSIS — Z833 Family history of diabetes mellitus: Secondary | ICD-10-CM

## 2023-01-31 DIAGNOSIS — M79674 Pain in right toe(s): Secondary | ICD-10-CM | POA: Diagnosis present

## 2023-01-31 DIAGNOSIS — R339 Retention of urine, unspecified: Secondary | ICD-10-CM | POA: Diagnosis present

## 2023-01-31 DIAGNOSIS — Z823 Family history of stroke: Secondary | ICD-10-CM

## 2023-01-31 DIAGNOSIS — I10 Essential (primary) hypertension: Secondary | ICD-10-CM | POA: Diagnosis present

## 2023-01-31 DIAGNOSIS — E1165 Type 2 diabetes mellitus with hyperglycemia: Secondary | ICD-10-CM | POA: Diagnosis present

## 2023-01-31 DIAGNOSIS — R6510 Systemic inflammatory response syndrome (SIRS) of non-infectious origin without acute organ dysfunction: Secondary | ICD-10-CM

## 2023-01-31 DIAGNOSIS — I495 Sick sinus syndrome: Secondary | ICD-10-CM | POA: Diagnosis present

## 2023-01-31 DIAGNOSIS — E872 Acidosis, unspecified: Secondary | ICD-10-CM | POA: Diagnosis present

## 2023-01-31 DIAGNOSIS — R319 Hematuria, unspecified: Secondary | ICD-10-CM | POA: Diagnosis not present

## 2023-01-31 DIAGNOSIS — Z9049 Acquired absence of other specified parts of digestive tract: Secondary | ICD-10-CM

## 2023-01-31 DIAGNOSIS — Z7982 Long term (current) use of aspirin: Secondary | ICD-10-CM

## 2023-01-31 DIAGNOSIS — Z8249 Family history of ischemic heart disease and other diseases of the circulatory system: Secondary | ICD-10-CM

## 2023-01-31 DIAGNOSIS — A4189 Other specified sepsis: Principal | ICD-10-CM | POA: Diagnosis present

## 2023-01-31 DIAGNOSIS — E1122 Type 2 diabetes mellitus with diabetic chronic kidney disease: Secondary | ICD-10-CM | POA: Diagnosis present

## 2023-01-31 LAB — I-STAT CG4 LACTIC ACID, ED
Lactic Acid, Venous: 2.4 mmol/L (ref 0.5–1.9)
Lactic Acid, Venous: 2.3 mmol/L (ref 0.5–1.9)

## 2023-01-31 LAB — CBC WITH DIFFERENTIAL/PLATELET
Abs Immature Granulocytes: 0.01 10*3/uL (ref 0.00–0.07)
Basophils Absolute: 0 10*3/uL (ref 0.0–0.1)
Basophils Relative: 0 %
Eosinophils Absolute: 0 10*3/uL (ref 0.0–0.5)
Eosinophils Relative: 1 %
HCT: 42.8 % (ref 39.0–52.0)
Hemoglobin: 14.5 g/dL (ref 13.0–17.0)
Immature Granulocytes: 0 %
Lymphocytes Relative: 13 %
Lymphs Abs: 0.9 10*3/uL (ref 0.7–4.0)
MCH: 30.1 pg (ref 26.0–34.0)
MCHC: 33.9 g/dL (ref 30.0–36.0)
MCV: 89 fL (ref 80.0–100.0)
Monocytes Absolute: 0.6 10*3/uL (ref 0.1–1.0)
Monocytes Relative: 9 %
Neutro Abs: 5.2 10*3/uL (ref 1.7–7.7)
Neutrophils Relative %: 77 %
Platelets: 251 10*3/uL (ref 150–400)
RBC: 4.81 MIL/uL (ref 4.22–5.81)
RDW: 12.7 % (ref 11.5–15.5)
WBC: 6.7 10*3/uL (ref 4.0–10.5)
nRBC: 0 % (ref 0.0–0.2)

## 2023-01-31 LAB — COMPREHENSIVE METABOLIC PANEL
ALT: 13 U/L (ref 0–44)
AST: 18 U/L (ref 15–41)
Albumin: 4.1 g/dL (ref 3.5–5.0)
Alkaline Phosphatase: 50 U/L (ref 38–126)
Anion gap: 11 (ref 5–15)
BUN: 15 mg/dL (ref 8–23)
CO2: 22 mmol/L (ref 22–32)
Calcium: 8.8 mg/dL — ABNORMAL LOW (ref 8.9–10.3)
Chloride: 100 mmol/L (ref 98–111)
Creatinine, Ser: 1.08 mg/dL (ref 0.61–1.24)
GFR, Estimated: >60 mL/min (ref >60)
Glucose, Bld: 191 mg/dL — ABNORMAL HIGH (ref 70–99)
Potassium: 4 mmol/L (ref 3.5–5.1)
Sodium: 133 mmol/L — ABNORMAL LOW (ref 135–145)
Total Bilirubin: 1.2 mg/dL (ref 0.3–1.2)
Total Protein: 7.5 g/dL (ref 6.5–8.1)

## 2023-01-31 LAB — URINALYSIS, W/ REFLEX TO CULTURE (INFECTION SUSPECTED)
Bacteria, UA: NONE SEEN
Bilirubin Urine: NEGATIVE
Glucose, UA: NEGATIVE mg/dL
Ketones, ur: NEGATIVE mg/dL
Leukocytes,Ua: NEGATIVE
Nitrite: NEGATIVE
Protein, ur: 30 mg/dL — AB
Specific Gravity, Urine: 1.009 (ref 1.005–1.030)
pH: 5 (ref 5.0–8.0)

## 2023-01-31 LAB — PROTIME-INR
INR: 1.1 (ref 0.8–1.2)
Prothrombin Time: 13.9 seconds (ref 11.4–15.2)

## 2023-01-31 LAB — APTT: aPTT: 33 seconds (ref 24–36)

## 2023-01-31 MED ORDER — FAMOTIDINE 20 MG PO TABS
20.0000 mg | ORAL_TABLET | Freq: Two times a day (BID) | ORAL | Status: DC
  Administered 2023-02-01 – 2023-02-06 (×11): 20 mg via ORAL
  Filled 2023-01-31 (×11): qty 1

## 2023-01-31 MED ORDER — LACTATED RINGERS IV BOLUS (SEPSIS)
1000.0000 mL | Freq: Once | INTRAVENOUS | Status: AC
  Administered 2023-01-31: 1000 mL via INTRAVENOUS

## 2023-01-31 MED ORDER — ONDANSETRON HCL 4 MG/2ML IJ SOLN: 4.0000 mg | Freq: Four times a day (QID) | INTRAMUSCULAR | Status: DC | PRN

## 2023-01-31 MED ORDER — ASPIRIN 81 MG PO CHEW
81.0000 mg | CHEWABLE_TABLET | Freq: Every day | ORAL | Status: DC
  Administered 2023-02-01 – 2023-02-06 (×6): 81 mg via ORAL
  Filled 2023-01-31 (×6): qty 1

## 2023-01-31 MED ORDER — SODIUM CHLORIDE 0.9% FLUSH
3.0000 mL | Freq: Two times a day (BID) | INTRAVENOUS | Status: DC
  Administered 2023-02-01 – 2023-02-06 (×12): 3 mL via INTRAVENOUS

## 2023-01-31 MED ORDER — ROSUVASTATIN CALCIUM 10 MG PO TABS
10.0000 mg | ORAL_TABLET | Freq: Every evening | ORAL | Status: DC
  Administered 2023-02-01 – 2023-02-06 (×6): 10 mg via ORAL
  Filled 2023-01-31 (×6): qty 1

## 2023-01-31 MED ORDER — ACETAMINOPHEN 325 MG PO TABS
650.0000 mg | ORAL_TABLET | Freq: Four times a day (QID) | ORAL | Status: DC | PRN
  Administered 2023-02-01 – 2023-02-04 (×2): 650 mg via ORAL
  Filled 2023-01-31 (×2): qty 2

## 2023-01-31 MED ORDER — HYDRALAZINE HCL 20 MG/ML IJ SOLN: 5.0000 mg | Freq: Three times a day (TID) | INTRAMUSCULAR | Status: DC | PRN

## 2023-01-31 MED ORDER — LACTATED RINGERS IV SOLN: INTRAVENOUS | Status: DC

## 2023-01-31 MED ORDER — ENOXAPARIN SODIUM 40 MG/0.4ML IJ SOSY
40.0000 mg | PREFILLED_SYRINGE | INTRAMUSCULAR | Status: DC
  Administered 2023-02-01 – 2023-02-03 (×3): 40 mg via SUBCUTANEOUS
  Filled 2023-01-31 (×3): qty 0.4

## 2023-01-31 MED ORDER — NITROGLYCERIN 0.4 MG SL SUBL: 0.4000 mg | SUBLINGUAL_TABLET | SUBLINGUAL | Status: DC | PRN

## 2023-01-31 MED ORDER — SODIUM CHLORIDE 0.9 % IV SOLN: 250.0000 mL | INTRAVENOUS | Status: DC | PRN

## 2023-01-31 MED ORDER — SODIUM CHLORIDE 0.9 % IV SOLN
2.0000 g | Freq: Once | INTRAVENOUS | Status: AC
  Administered 2023-01-31: 2 g via INTRAVENOUS
  Filled 2023-01-31: qty 12.5

## 2023-01-31 MED ORDER — VANCOMYCIN HCL 1500 MG/300ML IV SOLN
1500.0000 mg | Freq: Once | INTRAVENOUS | Status: AC
  Administered 2023-01-31: 1500 mg via INTRAVENOUS
  Filled 2023-01-31: qty 300

## 2023-01-31 MED ORDER — SODIUM CHLORIDE 0.9% FLUSH: 3.0000 mL | INTRAVENOUS | Status: DC | PRN

## 2023-01-31 MED ORDER — CLOPIDOGREL BISULFATE 75 MG PO TABS
75.0000 mg | ORAL_TABLET | Freq: Every day | ORAL | Status: DC
  Administered 2023-02-01 – 2023-02-05 (×4): 75 mg via ORAL
  Filled 2023-01-31 (×6): qty 1

## 2023-01-31 MED ORDER — ACETAMINOPHEN 650 MG RE SUPP: 650.0000 mg | Freq: Four times a day (QID) | RECTAL | Status: DC | PRN

## 2023-01-31 MED ORDER — LISINOPRIL 20 MG PO TABS
40.0000 mg | ORAL_TABLET | Freq: Every day | ORAL | Status: DC
  Administered 2023-02-01 – 2023-02-06 (×6): 40 mg via ORAL
  Filled 2023-01-31 (×6): qty 2

## 2023-01-31 MED ORDER — ONDANSETRON HCL 4 MG PO TABS: 4.0000 mg | ORAL_TABLET | Freq: Four times a day (QID) | ORAL | Status: DC | PRN

## 2023-01-31 MED ORDER — SODIUM CHLORIDE 0.9 % IV SOLN
2.0000 g | INTRAVENOUS | Status: AC
  Administered 2023-02-01 – 2023-02-03 (×3): 2 g via INTRAVENOUS
  Filled 2023-01-31 (×3): qty 20

## 2023-01-31 MED ORDER — VANCOMYCIN HCL IN DEXTROSE 1-5 GM/200ML-% IV SOLN: 1000.0000 mg | Freq: Once | INTRAVENOUS | Status: DC

## 2023-01-31 NOTE — Sepsis Progress Note (Signed)
Elink following for sepsis protocol. 

## 2023-01-31 NOTE — Progress Notes (Signed)
A consult was received from an ED physician for cefepime and vancomycin per pharmacy dosing.  The patient's profile has been reviewed for ht/wt/allergies/indication/available labs.    A one time order has been placed for cefepime 2 g IV and vancomycin 1500 mg IV.    Further antibiotics/pharmacy consults should be ordered by admitting physician if indicated.                       Thank you, Lynden Ang, PharmD, BCPS 01/31/2023  8:28 PM

## 2023-01-31 NOTE — ED Notes (Signed)
Pt. I-stat Lactic acid results 2.4, EDP,[place] made aware.

## 2023-01-31 NOTE — ED Provider Notes (Signed)
[place] EMERGENCY DEPARTMENT AT [facility] Provider Note   CSN: [id number] Arrival date & time: 01/31/23  2000     History No chief complaint on file.   HPI Joel Stanley is a 82 y.o. male presenting for altered mental status, brought in from home for AMS, fever, tachycardia per family.  EMS had a temperature of 101, heart rate of 120 brought patient for immediate care and management.  Patient denies any symptoms.  He does endorse feeling "groggy" but otherwise denies any fevers or chills, nausea vomiting, syncope, chest pain, shortness of breath, cough, dysuria.. Patient is a poor historian pending family arrival.  Activated as a code sepsis on arrival. Notably, patient's family has just arrived.  They state that he is otherwise at his baseline.  They deny any concerns for infection leading into this.  He has had some mild weakness feeling too weak to get up and move around is much as normal but he has been working out in the yard throughout the day the past few days. Family endorses a history of similar with hyperglycemia or dehydration. Notably they clarified that the temperature reported by EMS of 101 was taken off with a home thermometer by a neighbor who is a IT sales professional.  Temperature here was below 100.4.  They state that they have evaluated him at bedside and feel that he is otherwise at his mental status and behavioral baseline.  They have no acute concerns at this time.  Patient's recorded medical, surgical, social, medication list and allergies were reviewed in the Snapshot window as part of the initial history.   Review of Systems   Review of Systems  Constitutional:  Positive for fatigue. Negative for chills and fever.  HENT:  Negative for ear pain and sore throat.   Eyes:  Negative for pain and visual disturbance.  Respiratory:  Negative for cough and shortness of breath.   Cardiovascular:  Negative for chest pain and palpitations.  Gastrointestinal:   Negative for abdominal pain and vomiting.  Genitourinary:  Negative for dysuria and hematuria.  Musculoskeletal:  Negative for arthralgias and back pain.  Skin:  Negative for color change and rash.  Neurological:  Positive for weakness. Negative for seizures and syncope.  All other systems reviewed and are negative.   Physical Exam Updated Vital Signs BP (!) 161/87   Pulse 99   Temp 100.3 F (37.9 C) (Oral)   Resp 15   SpO2 93%  Physical Exam Vitals and nursing note reviewed.  Constitutional:      General: He is not in acute distress.    Appearance: He is well-developed.  HENT:     Head: Normocephalic and atraumatic.  Eyes:     Conjunctiva/sclera: Conjunctivae normal.  Cardiovascular:     Rate and Rhythm: Normal rate and regular rhythm.     Heart sounds: No murmur heard. Pulmonary:     Effort: Pulmonary effort is normal. No respiratory distress.     Breath sounds: Normal breath sounds.  Abdominal:     Palpations: Abdomen is soft.     Tenderness: There is no abdominal tenderness.  Musculoskeletal:        General: No swelling.     Cervical back: Neck supple.  Skin:    General: Skin is warm and dry.     Capillary Refill: Capillary refill takes less than 2 seconds.  Neurological:     Mental Status: He is alert.  Psychiatric:        Mood  and Affect: Mood normal.      ED Course/ Medical Decision Making/ A&P    Procedures .Critical Care  Performed by: Glyn Ade, MD Authorized by: Glyn Ade, MD   Critical care provider statement:    Critical care time (minutes):  30   Critical care was necessary to treat or prevent imminent or life-threatening deterioration of the following conditions:  Sepsis and dehydration   Critical care was time spent personally by me on the following activities:  Development of treatment plan with patient or surrogate, discussions with consultants, evaluation of patient's response to treatment, examination of patient, ordering  and review of laboratory studies, ordering and review of radiographic studies, ordering and performing treatments and interventions, pulse oximetry, re-evaluation of patient's condition and review of old charts   Care discussed with: admitting provider      Medications Ordered in ED Medications  lactated ringers infusion (has no administration in time range)  vancomycin (VANCOREADY) IVPB 1500 mg/300 mL (1,500 mg Intravenous New Bag/Given 01/31/23 2207)  lactated ringers bolus 1,000 mL (0 mLs Intravenous Stopped 01/31/23 2126)    And  lactated ringers bolus 1,000 mL (1,000 mLs Intravenous New Bag/Given 01/31/23 2126)    And  lactated ringers bolus 1,000 mL (1,000 mLs Intravenous New Bag/Given 01/31/23 2214)  ceFEPIme (MAXIPIME) 2 g in sodium chloride 0.9 % 100 mL IVPB (0 g Intravenous Stopped 01/31/23 2126)    Medical Decision Making:    Joel Stanley is a 82 y.o. male who presented to the ED today with reported altered mental status and weakness resolved on arrival and detailed above.     Handoff received from EMS.  Additional history discussed with patient's family/caregivers.  Patient placed on continuous vitals and telemetry monitoring while in ED which was reviewed periodically.   Complete initial physical exam performed, notably the patient  was tachycardic and tachypneic, appears dehydrated on exam.  Activated as a code sepsis due to reported temperature of 101 by EMS, tachycardia tachypneic being 3 out of 3 SIRS positive. Pending source evaluation, code sepsis protocol was initiated with IV antibiotics, lactic acids, blood cultures..      Reviewed and confirmed nursing documentation for past medical history, family history, social history.    Initial Assessment:   Initially, patient's presentation raise concern for sepsis though the patient was overall well-appearing.  His temperature here was less than 100.4 deactivating 1 SIRS criteria but he remained tachycardic and  tachypneic. Out of an abundance of caution, code sepsis was initiated pending evaluation. Underlying sources such as urinary tract infection, pulmonary infection and abdominal sources were all considered.  His abdominal exam is grossly benign.  He was initially tachypneic but his x-ray had no focal pathology on my review.  Viral etiology would be on the differential as well and was ordered. Notably, patient overall well-appearing.    Initial Plan:  Code sepsis activated on patient's arrival including.  Antibiotics for uncertain etiology, blood cultures and lactic acids Screening labs including CBC and Metabolic panel to evaluate for infectious or metabolic etiology of disease.  Urinalysis with reflex culture ordered to evaluate for UTI or relevant urologic/nephrologic pathology.  CXR to evaluate for structural/infectious intrathoracic pathology.  Troponin and EKG to evaluate for cardiac pathology. Objective evaluation as below reviewed with plan for close reassessment  Initial Study Results:   Laboratory  All laboratory results reviewed without evidence of clinically relevant pathology.    EKG EKG was reviewed independently. Rate, rhythm, axis, intervals all examined and  without medically relevant abnormality. ST segments without concerns for elevations.    Radiology  All images reviewed independently. Agree with radiology report at this time.   DG Chest Port 1 View  Result Date: 01/31/2023 CLINICAL DATA:  Hypertension and tachycardia EXAM: PORTABLE CHEST 1 VIEW COMPARISON:  05/20/2022 FINDINGS: Cardiac shadow is stable. Lungs are well aerated bilaterally. No focal infiltrate or effusion is seen. No bony abnormality is noted. IMPRESSION: No acute abnormality noted. Electronically Signed   By: Alcide Clever M.D.   On: 01/31/2023 21:42     Reassessment and Plan:   Later when the family arrived, their history grossly decreases likelihood of serious infection.  They are endorsing more with  multiple days of generalized malaise in the setting of hard work around the yard.  They state that he gets this way when he is hyperglycemic or dehydrated and has a history of medication noncompliance. However lab work resulted nondiagnostic. Patient is too weak to get up and walk and has had some recurrence of his altered mental status over his observation in the emergency room.  He is developed a lactic acidosis.  Serial lactic acids are ordered.  He is urinating very well.  Will consult hospitalist for admission for suspected sepsis given fever, tachycardia, respiratory symptoms on broad-spectrum antibiosis.  Clinical Impression:  1. Sepsis, due to unspecified organism, unspecified whether acute organ dysfunction present [facility])      Admit   Final Clinical Impression(s) / ED Diagnoses Final diagnoses:  Sepsis, due to unspecified organism, unspecified whether acute organ dysfunction present [facility])    Rx / DC Orders ED Discharge Orders     None         Glyn Ade, MD 01/31/23 2317

## 2023-01-31 NOTE — H&P (Incomplete)
History and Physical    Joel Stanley ZOX:[id number] DOB: 05-Feb-1941 DOA: 01/31/2023  [facility]   Patient coming from: Home   Chief Complaint: Elevated blood pressure, high blood glucose, tachycardia and generalized weakness  HPI:  Joel Stanley is a 82 y.o. male with medical history significant of sinus bradycardia with sinus node dysfunction, CAD, hypertension, hyperlipidemia, OSA and DM type II presented to emergency department with complaining of generalized weakness, elevated blood pressure, elevated blood glucose and elevated heart rate at home.  Workup in the ED revealed elevated lactic acid.  Unknown source of infection.  In the ED patient has been started on treating with broad-spectrum antibiotic vancomycin and cefepime.  Patient has been recently evaluated by cardiology on 11/24/2022 for the management of sinus bradycardia/sinus node dysfunction and pauses metoprolol has been discontinued.  ED Course:  At presentation to patient has low-grade temperature 100.3 F, tachycardic 110, respiratory rate 25 and blood pressure 145/89 O2 sat 94% room air. CMP showed corrected sodium 134, potassium 4, chloride 100, bicarb 22, blood glucose 191, BUN 15, creatinine 1.08, calcium 8.8, normal AST/ALT. CBC grossly unremarkable. UA unremarkable. Pending respiratory panel.   Blood cultures x 2 in process Elevated lactic acid 2.4.  Chest x-ray unremarkable finding.  In the ED with concern for SIRS code sepsis has been activated and patient has been resuscitated with 3 L of LR bolus and currently being on LR 150 cc/h patient may also treated with vancomycin and cefepime with 1 dose.  Hospitalist has been consulted for the management of unknown source of infection.  Review of Systems:  Review of Systems  Constitutional:  Negative for chills, diaphoresis, fever, malaise/fatigue and weight loss.  Respiratory:  Negative for cough, sputum production, shortness of  breath and wheezing.   Cardiovascular:  Negative for chest pain and leg swelling.  Gastrointestinal:  Negative for abdominal pain, diarrhea, heartburn, nausea and vomiting.  Genitourinary:  Negative for dysuria, frequency and urgency.  Musculoskeletal:  Negative for myalgias and neck pain.  Neurological:  Negative for dizziness and headaches.    Past Medical History:  Diagnosis Date   Coronary artery disease    Exertional angina    Family history of heart disease    Hyperlipidemia    Hypertension    Obstructive sleep apnea    Type 2 diabetes mellitus (HCC)     Past Surgical History:  Procedure Laterality Date   APPENDECTOMY  1969   CARDIAC CATHETERIZATION  08/21/2010   PLA 75% fairly focal "in-stent" restenosis-cutting balloon atherectomy performed, expanded to 10 atm, resutling in reductin to 20% without dissection. PDA 90% proximal stenosis-stented with a 2.5x32mm Promus Element stent, deployed at 14 atm (2.31mm, resulting in reduction of 90% to 0% residual).   CARDIAC CATHETERIZATION  05/03/2008   PLA 100% occluded proximal stenosis-stented with a 2.75x39mm Promus stent deployed at 14 atm, resulting in reduction of 100% to 0% residual   CARDIAC CATHETERIZATION  01/27/2005   RCA 80-85% stenosis, stenting performed with a 3.5x10mm Taxus DES, dilated to 3.75 with a 0% residual.   CARDIOVASCULAR STRESS TEST  07/16/2009   Mild-moderate perfusion defect seen in Basal inferior and Mid inferior regions. No scitigraphic evidence of inducible myocardial ischemia. No ECG changes. EKG negative for ischemia. Post-stress EF 56%.   CHOLECYSTECTOMY  1985   HERNIA REPAIR  1993, 2003   LEFT HEART CATH AND CORONARY ANGIOGRAPHY N/A 12/14/2017   Procedure: LEFT HEART CATH AND CORONARY ANGIOGRAPHY;  Surgeon: [name], MD;  Location: [facility];  Service: Cardiovascular;  Laterality: N/A;   TONSILLECTOMY  1946   TOTAL KNEE ARTHROPLASTY Left 2004     reports that he quit smoking about 49  years ago. His smoking use included cigarettes. He has never used smokeless tobacco. He reports current alcohol use. He reports that he does not use drugs.  Allergies  Allergen Reactions   Yellow Jacket Venom    Yellow Jacket Venom     Family History  Problem Relation Age of Onset   Heart attack Father 41   Diabetes Maternal Grandmother    Stroke Maternal Grandmother 42   Heart attack Brother 45   Heart attack Maternal Grandfather 64   Heart attack Paternal Grandmother    Heart attack Paternal Grandfather    Pulmonary embolism Paternal Grandfather    Heart attack Brother 37    Prior to Admission medications   Medication Sig Start Date End Date Taking? Authorizing Provider  acetaminophen (TYLENOL) 650 MG CR tablet Take 650 mg by mouth as needed.    [provider]  aspirin 81 MG chewable tablet Chew 1 tablet (81 mg total) by mouth daily. Patient taking differently: Chew 81 mg by mouth as needed. 12/15/17   Joseph Art, DO  Cholecalciferol (VITAMIN D-3) 1000 UNITS CAPS Take 2,000 Units by mouth daily.    [provider]  clopidogrel (PLAVIX) 75 MG tablet Take 75 mg by mouth daily.    [provider]  EPINEPHrine 0.3 mg/0.3 mL IJ SOAJ injection  08/02/20   [provider]  famotidine (PEPCID) 20 MG tablet Take 1 tablet by mouth 2 (two) times daily. 08/02/20   [provider]  lisinopril (ZESTRIL) 40 MG tablet Take 1 tablet (40 mg total) by mouth daily. 06/17/20   Azucena Fallen, MD  metFORMIN (GLUCOPHAGE) 500 MG tablet Take 1 tablet (500 mg total) by mouth 2 (two) times daily with a meal. Patient taking differently: Take 1,000 mg by mouth 2 (two) times daily with a meal. 12/17/17   Joseph Art, DO  nitroGLYCERIN (NITROSTAT) 0.4 MG SL tablet Place 1 tablet (0.4 mg total) under the tongue every 5 (five) minutes x 3 doses as needed for chest pain. 07/29/22   [name], MD  rosuvastatin (CRESTOR) 10 MG tablet Take 10 mg by mouth  every evening.    [provider]     Physical Exam: Vitals:   01/31/23 2016 01/31/23 2017 01/31/23 2230  BP:  (!) 145/89 (!) 161/87  Pulse:  (!) 110 99  Resp:  (!) 25 15  Temp: 100.3 F (37.9 C)    TempSrc: Oral    SpO2:  94% 93%    Physical Exam Constitutional:      General: He is not in acute distress.    Appearance: He is not ill-appearing.  HENT:     Head: Normocephalic.     Nose: Nose normal.     Mouth/Throat:     Mouth: Mucous membranes are moist.  Eyes:     Pupils: Pupils are equal, round, and reactive to light.  Cardiovascular:     Rate and Rhythm: Normal rate and regular rhythm.     Pulses: Normal pulses.     Heart sounds: Normal heart sounds.  Pulmonary:     Effort: Pulmonary effort is normal.     Breath sounds: Normal breath sounds.  Abdominal:     General: Bowel sounds are normal. There is no distension.  Musculoskeletal:  Cervical back: Neck supple.     Right lower leg: No edema.     Left lower leg: No edema.  Skin:    General: Skin is warm.     Capillary Refill: Capillary refill takes less than 2 seconds.  Neurological:     Mental Status: He is oriented to person, place, and time.  Psychiatric:        Mood and Affect: Mood normal.        Thought Content: Thought content normal.        Judgment: Judgment normal.      Labs on Admission: I have personally reviewed following labs and imaging studies  CBC: Recent Labs  Lab 01/31/23 2023  WBC 6.7  NEUTROABS 5.2  HGB 14.5  HCT 42.8  MCV 89.0  PLT 251   Basic Metabolic Panel: Recent Labs  Lab 01/31/23 2023  NA 133*  K 4.0  CL 100  CO2 22  GLUCOSE 191*  BUN 15  CREATININE 1.08  CALCIUM 8.8*   GFR: CrCl cannot be calculated (Unknown ideal weight.). Liver Function Tests: Recent Labs  Lab 01/31/23 2023  AST 18  ALT 13  ALKPHOS 50  BILITOT 1.2  PROT 7.5  ALBUMIN 4.1   No results for input(s): "LIPASE", "AMYLASE" in the last 168 hours. No results for input(s):  "AMMONIA" in the last 168 hours. Coagulation Profile: Recent Labs  Lab 01/31/23 2023  INR 1.1   Cardiac Enzymes: No results for input(s): "CKTOTAL", "CKMB", "CKMBINDEX", "TROPONINI", "TROPONINIHS" in the last 168 hours. BNP (last 3 results) No results for input(s): "BNP" in the last 8760 hours. HbA1C: No results for input(s): "HGBA1C" in the last 72 hours. CBG: No results for input(s): "GLUCAP" in the last 168 hours. Lipid Profile: No results for input(s): "CHOL", "HDL", "LDLCALC", "TRIG", "CHOLHDL", "LDLDIRECT" in the last 72 hours. Thyroid Function Tests: No results for input(s): "TSH", "T4TOTAL", "FREET4", "T3FREE", "THYROIDAB" in the last 72 hours. Anemia Panel: No results for input(s): "VITAMINB12", "FOLATE", "FERRITIN", "TIBC", "IRON", "RETICCTPCT" in the last 72 hours. Urine analysis:    Component Value Date/Time   COLORURINE STRAW (A) 01/31/2023 2023   APPEARANCEUR CLEAR 01/31/2023 2023   LABSPEC 1.009 01/31/2023 2023   PHURINE 5.0 01/31/2023 2023   GLUCOSEU NEGATIVE 01/31/2023 2023   HGBUR SMALL (A) 01/31/2023 2023   BILIRUBINUR NEGATIVE 01/31/2023 2023   KETONESUR NEGATIVE 01/31/2023 2023   PROTEINUR 30 (A) 01/31/2023 2023   UROBILINOGEN 1.0 04/30/2008 2316   NITRITE NEGATIVE 01/31/2023 2023   LEUKOCYTESUR NEGATIVE 01/31/2023 2023    Radiological Exams on Admission: I have personally reviewed images DG Chest Port 1 View  Result Date: 01/31/2023 CLINICAL DATA:  Hypertension and tachycardia EXAM: PORTABLE CHEST 1 VIEW COMPARISON:  05/20/2022 FINDINGS: Cardiac shadow is stable. Lungs are well aerated bilaterally. No focal infiltrate or effusion is seen. No bony abnormality is noted. IMPRESSION: No acute abnormality noted. Electronically Signed   By: Alcide Clever M.D.   On: 01/31/2023 21:42    EKG: My personal interpretation of EKG shows: EKG showed sinus tachycardia heart rate 105.  No ST and T wave abnormality.  Left ventricular  hypertrophy.     Assessment/Plan: Principal Problem:   SIRS (systemic inflammatory response syndrome) (HCC) Active Problems:   Essential hypertension   Acute metabolic encephalopathy   CAD (coronary artery disease)   Diabetes type 2, controlled (HCC)   OSA (obstructive sleep apnea)   History of sinus bradycardia with sinus node dysfunction   Hyperglycemia related-hyponatremia  Assessment and Plan: SIRS-unknown source of infection -On presentation to ED patient has low-grade temperature 100.3 F, tachycardic 110s and elevated blood pressure.  Elevated lactic acid 2.4.  Patient meets SIRS criteria. -UA unremarkable.  Chest x-ray no active disease process. -In the ED patient has been resuscitated with 3 L of LR bolus.  With that blood pressure has been trended up to upper 170s.  He also has been treated with vancomycin and cefepime once. -At this point unknown source of infection. - Continue empirically treating with ceftriaxone 2 g daily. - Will follow-up with blood culture and respiratory panel for appropriate antibiotic guidance.  Bladder fullness/unable to void - Obtaining bladder scan and requesting for fluid replacement   Acute metabolic encephalopathy-resolved  Essential hypertension  History of CAD  History of sinus bradycardia/sick sinus syndrome/sinus node dysfunction  Non-insulin-dependent DM type II   DVT prophylaxis:  Lovenox Code Status:  Full Code Diet: Heart healthy and carb modified diet Family Communication: Discussed treatment plan with patient family at the bedside Disposition Plan: Pending blood culture results.  Tentative discharge to home next 2 to 3 days. Consults:   Admission status:   Inpatient, Telemetry bed  Severity of Illness: The appropriate patient status for this patient is INPATIENT. Inpatient status is judged to be reasonable and necessary in order to provide the required intensity of service to ensure the patient's safety. The  patient's presenting symptoms, physical exam findings, and initial radiographic and laboratory data in the context of their chronic comorbidities is felt to place them at high risk for further clinical deterioration. Furthermore, it is not anticipated that the patient will be medically stable for discharge from the hospital within 2 midnights of admission.   * I certify that at the point of admission it is my clinical judgment that the patient will require inpatient hospital care spanning beyond 2 midnights from the point of admission due to high intensity of service, high risk for further deterioration and high frequency of surveillance required.Marland Kitchen    Tereasa Coop, MD [facility]  How to contact the [facility] Attending or Consulting provider 7A - 7P or covering provider during after hours 7P -7A, for this patient.  Check the care team in [facility] and look for a) attending/consulting [facility] provider listed and b) the [facility] team listed Log into [email or web address] and use Alicia's universal password to access. If you do not have the password, please contact the hospital operator. Locate the [facility] provider you are looking for under [facility] and page to a number that you can be directly reached. If you still have difficulty reaching the provider, please page the [facility] (Director on Call) for the Hospitalists listed on amion for assistance.  01/31/2023, 11:55 PM

## 2023-01-31 NOTE — ED Triage Notes (Signed)
Pt BIB [facility] from home. Pt family called for pt having high blood pressure, hyperglycemia, and tachycardia. Pt having increased weakness throughout the past two days. Hx MI,  hypertension, Diabetes. Aox4.   End tidal 20  RR24 143/88 CBG 220 94% 2L [place] 112HR

## 2023-02-01 DIAGNOSIS — A419 Sepsis, unspecified organism: Secondary | ICD-10-CM

## 2023-02-01 DIAGNOSIS — I10 Essential (primary) hypertension: Secondary | ICD-10-CM

## 2023-02-01 DIAGNOSIS — U071 COVID-19: Secondary | ICD-10-CM

## 2023-02-01 DIAGNOSIS — N182 Chronic kidney disease, stage 2 (mild): Secondary | ICD-10-CM | POA: Insufficient documentation

## 2023-02-01 DIAGNOSIS — R6510 Systemic inflammatory response syndrome (SIRS) of non-infectious origin without acute organ dysfunction: Secondary | ICD-10-CM | POA: Diagnosis not present

## 2023-02-01 LAB — CBC
HCT: 43 % (ref 39.0–52.0)
Hemoglobin: 14.4 g/dL (ref 13.0–17.0)
MCH: 29.9 pg (ref 26.0–34.0)
MCHC: 33.5 g/dL (ref 30.0–36.0)
MCV: 89.4 fL (ref 80.0–100.0)
Platelets: 230 10*3/uL (ref 150–400)
RBC: 4.81 MIL/uL (ref 4.22–5.81)
RDW: 12.7 % (ref 11.5–15.5)
WBC: 8 10*3/uL (ref 4.0–10.5)
nRBC: 0 % (ref 0.0–0.2)

## 2023-02-01 LAB — URINALYSIS, ROUTINE W REFLEX MICROSCOPIC
Bilirubin Urine: NEGATIVE
Glucose, UA: >=500 mg/dL — AB
Ketones, ur: 20 mg/dL — AB
Leukocytes,Ua: NEGATIVE
Nitrite: NEGATIVE
Protein, ur: 100 mg/dL — AB
RBC / HPF: >50 RBC/hpf (ref 0–5)
Specific Gravity, Urine: 1.009 (ref 1.005–1.030)
pH: 5 (ref 5.0–8.0)

## 2023-02-01 LAB — COMPREHENSIVE METABOLIC PANEL WITH GFR
ALT: 15 U/L (ref 0–44)
AST: 16 U/L (ref 15–41)
Albumin: 3.8 g/dL (ref 3.5–5.0)
Alkaline Phosphatase: 46 U/L (ref 38–126)
Anion gap: 13 (ref 5–15)
BUN: 16 mg/dL (ref 8–23)
CO2: 21 mmol/L — ABNORMAL LOW (ref 22–32)
Calcium: 8.9 mg/dL (ref 8.9–10.3)
Chloride: 100 mmol/L (ref 98–111)
Creatinine, Ser: 0.98 mg/dL (ref 0.61–1.24)
GFR, Estimated: >60 mL/min (ref >60)
Glucose, Bld: 260 mg/dL — ABNORMAL HIGH (ref 70–99)
Potassium: 3.6 mmol/L (ref 3.5–5.1)
Sodium: 134 mmol/L — ABNORMAL LOW (ref 135–145)
Total Bilirubin: 1.1 mg/dL (ref 0.3–1.2)
Total Protein: 7.2 g/dL (ref 6.5–8.1)

## 2023-02-01 LAB — APTT: aPTT: 35 seconds (ref 24–36)

## 2023-02-01 LAB — GLUCOSE, CAPILLARY
Glucose-Capillary: 217 mg/dL — ABNORMAL HIGH (ref 70–99)
Glucose-Capillary: 203 mg/dL — ABNORMAL HIGH (ref 70–99)
Glucose-Capillary: 189 mg/dL — ABNORMAL HIGH (ref 70–99)
Glucose-Capillary: 202 mg/dL — ABNORMAL HIGH (ref 70–99)

## 2023-02-01 LAB — PROTIME-INR
INR: 1.1 (ref 0.8–1.2)
Prothrombin Time: 14.1 seconds (ref 11.4–15.2)

## 2023-02-01 LAB — RESP PANEL BY RT-PCR (RSV, FLU A&B, COVID)  RVPGX2
Influenza A by PCR: NEGATIVE
Influenza B by PCR: NEGATIVE
Resp Syncytial Virus by PCR: NEGATIVE
SARS Coronavirus 2 by RT PCR: POSITIVE — AB

## 2023-02-01 LAB — TROPONIN I (HIGH SENSITIVITY): Troponin I (High Sensitivity): 156 ng/L (ref <18)

## 2023-02-01 LAB — LACTIC ACID, PLASMA: Lactic Acid, Venous: 1.6 mmol/L (ref 0.5–1.9)

## 2023-02-01 MED ORDER — TAMSULOSIN HCL 0.4 MG PO CAPS
0.4000 mg | ORAL_CAPSULE | Freq: Every day | ORAL | Status: DC
  Administered 2023-02-01 – 2023-02-06 (×6): 0.4 mg via ORAL
  Filled 2023-02-01 (×6): qty 1

## 2023-02-01 MED ORDER — HYDRALAZINE HCL 20 MG/ML IJ SOLN: 10.0000 mg | Freq: Three times a day (TID) | INTRAMUSCULAR | Status: DC | PRN

## 2023-02-01 MED ORDER — INSULIN ASPART 100 UNIT/ML IJ SOLN
0.0000 [IU] | Freq: Three times a day (TID) | INTRAMUSCULAR | Status: DC
  Administered 2023-02-01 (×2): 2 [IU] via SUBCUTANEOUS
  Administered 2023-02-01 – 2023-02-02 (×3): 1 [IU] via SUBCUTANEOUS
  Administered 2023-02-02: 3 [IU] via SUBCUTANEOUS
  Administered 2023-02-03 (×3): 2 [IU] via SUBCUTANEOUS
  Administered 2023-02-04 (×2): 1 [IU] via SUBCUTANEOUS
  Administered 2023-02-04 – 2023-02-05 (×2): 2 [IU] via SUBCUTANEOUS
  Administered 2023-02-05 – 2023-02-06 (×5): 1 [IU] via SUBCUTANEOUS
  Filled 2023-02-01: qty 0.06

## 2023-02-01 MED ORDER — GUAIFENESIN 100 MG/5ML PO LIQD
5.0000 mL | ORAL | Status: DC | PRN
  Administered 2023-02-01 – 2023-02-02 (×4): 5 mL via ORAL
  Filled 2023-02-01 (×4): qty 10

## 2023-02-01 NOTE — Procedures (Signed)
Foley Catheter Placement Note  Indications: 82 y.o. male admitted with sepsis who was found to be in urinary retention. Nursing unable to place foley catheter. Urology consulted.   Pre-operative Diagnosis: Urinary retention  Post-operative Diagnosis: Same  Surgeon: Jerald Kief, MD  Assistants: None  Procedure Details  Patient was placed in the supine position, prepped with Betadine and draped in the usual sterile fashion.  We injected lidocaine jelly per urethra prior to the procedure.  We then inserted a 16 [place] coude catheter per urethra which easily passed into the bladder without any resistance at the prostatic urethra.  We achieved return of clear yellow urine and then proceeded to insert 10 mL of sterile water into the Foley balloon.  The catheter was attached to a drainage bag and secured with a StatLock.  Placement of the catheter had return of greater than 1000 mL of clear pink tinged urine.               Complications: None; patient tolerated the procedure well.  Plan:   1.  Continue Foley catheter to drainage for 2 weeks days due to greater than 1000 mL of urine returned with catheter placement and mild bladder stretch injury 2.  Patient should undergo TOV with PCP or urology.   Attending Attestation: Dr. Marlou Porch was available.

## 2023-02-01 NOTE — Progress Notes (Signed)
Was called to  the beside by MD Tereasa Coop. Verbal order given to place coude cath. Two attempts made with an 33 and 20 french cath and was unsuccessful. Made primary RN Thayer Ohm aware, paged MD to make award put was unsuccessful. Plan of care ongoing.

## 2023-02-01 NOTE — Progress Notes (Addendum)
Bladder fullness/unable to void - Obtaining bladder scan and requesting to hold the IV fluid. Of note, ED nurse tried to place Foley catheter, condom catheter and Coude catheter with unsuccessful attempt.  Consulted urology nurse for help with placing the Foley catheter. Addendum -Bladder scan showed urinary retention 772 mL.  Unsuccessful attempt to place the Foley x 4 by ED nurse and urology nurse as well.  Consulted urology Dr. Molli Hazard, requested  to get the urology cart at the bedside. -Dr. Molli Hazard was successful able to place an Foley catheter at the bedside.  Patient has urine output immediately.  Recommended to continue Foley catheter for drainage for 2 weeks due to greater than 1 L of urine returned with catheter placement and mild bladder stretch injury.  Patient should undergo voiding trial with PCP or urology outpatient on discharge.  Please see Dr. Ashley Royalty procedure note for detail information.  Tereasa Coop, MD [facility] 02/01/2023, 4:45 AM

## 2023-02-01 NOTE — Progress Notes (Signed)
[facility] PROGRESS NOTE    Progress Note  Joel Stanley  ZOX:[id number] DOB: 21-Jun-1941 DOA: 01/31/2023 PCP: [facility]     Brief Narrative:   Joel Stanley is an 82 y.o. 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  Patient relates has not been able to void for the last several months got worse over the last several 2 days on the day of admission he cannot urinate   Assessment/Plan:   SIRS (systemic inflammatory response syndrome) (HCC) Unclear source infectious workup has been negative he did have elevated lactic acid and a temp of 100.3. Was fluid resuscitated started empirically on antibiotics and urine cultures as well as blood cultures are pending. SARS-CoV-2 is positive, chest x-ray is clear he relates no shortness of breath. Consult PT OT out of bed to chair.  Acute urinary retention: Probably contributing to SIRS urology was consulted Foley catheter was placed about a liter of urine came out. Question due to BPH with pinkish urine likely due to mild traumatic catheter placement. Start him on Flomax he will go home with a Foley will need to follow-up with urology as an outpatient.  Will await urine cultures and continue Rocephin.  Acute metabolic encephalopathy: Of unclear source question multifactorial in the setting of acute urinary retention and infectious etiology.  Essential hypertension: Blood pressure elevated on admission agree with continuing lisinopril hydralazine IV as needed resume home meds.  History of CAD: Continue aspirin and Plavix and statins.  History of sick sinus syndrome sinus node dysfunction: Metoprolol has been discontinued as an outpatient. He remains  mildly tachycardic continue to observe question of tachycardia due to stress reaction.  Non-insulin-dependent diabetes mellitus type 2: With last A1c of 6.6 hold metformin continue sliding scale insulin.  Chronic kidney disease stage II: With baseline creatinine around 1.0. L4 catheter placed by urology good urine output.  DVT prophylaxis: lovenox Family Communication:none Status is: Inpatient Remains inpatient appropriate because: SIRS    Code Status:     Code Status Orders  (From admission, onward)           Start     Ordered   01/31/23 2354  Full code  Continuous       Question:  By:  Answer:  Consent: discussion documented in EHR   01/31/23 2355           Code Status History     Date Active Date Inactive Code Status Order ID Comments User Context   06/14/2020 2220 06/17/2020 1920 Full Code [id number]  Charlsie Quest, MD ED   12/13/2017 2255 12/14/2017 2113 Full Code [id number]  Briscoe Deutscher, MD ED         IV Access:   Peripheral IV   Procedures and diagnostic studies:   DG Chest Port 1 View  Result Date: 01/31/2023 CLINICAL DATA:  Hypertension and tachycardia EXAM: PORTABLE CHEST 1 VIEW COMPARISON:  05/20/2022 FINDINGS: Cardiac shadow is stable. Lungs are well aerated bilaterally. No focal infiltrate or effusion is seen. No bony abnormality is noted. IMPRESSION: No acute abnormality noted. Electronically Signed   By: Alcide Clever M.D.   On: 01/31/2023 21:42     Medical Consultants:   None.   Subjective:    Joel Stanley no complaints  feels better.  Objective:    Vitals:   01/31/23 2230 02/01/23 0209 02/01/23 0442 02/01/23 0608  BP: (!) 161/87  (!) 152/96 128/84  Pulse: 99  (!) 113 (!) 102  Resp: 15  (!) 22 20  Temp:  98.1 F (36.7 C)  98.2 F (36.8 C)  TempSrc:  Oral  Oral  SpO2: 93%  91% 93%   SpO2: 93 %   Intake/Output Summary (Last 24 hours) at 02/01/2023 0657 Last data filed at 02/01/2023 0545 Gross per 24 hour  Intake  1000 ml  Output 1150 ml  Net -150 ml   There were no vitals filed for this visit.  Exam: General exam: In no acute distress. Respiratory system: Good air movement and clear to auscultation. Cardiovascular system: S1 & S2 heard, RRR. No JVD.  Gastrointestinal system: Abdomen is nondistended, soft and nontender.  Extremities: No pedal edema. Skin: No rashes, lesions or ulcers Psychiatry: Judgement and insight appear normal. Mood & affect appropriate.    Data Reviewed:    Labs: Basic Metabolic Panel: Recent Labs  Lab 01/31/23 2023 02/01/23 0500  NA 133* 134*  K 4.0 3.6  CL 100 100  CO2 22 21*  GLUCOSE 191* 260*  BUN 15 16  CREATININE 1.08 0.98  CALCIUM 8.8* 8.9   GFR CrCl cannot be calculated (Unknown ideal weight.). Liver Function Tests: Recent Labs  Lab 01/31/23 2023 02/01/23 0500  AST 18 16  ALT 13 15  ALKPHOS 50 46  BILITOT 1.2 1.1  PROT 7.5 7.2  ALBUMIN 4.1 3.8   No results for input(s): "LIPASE", "AMYLASE" in the last 168 hours. No results for input(s): "AMMONIA" in the last 168 hours. Coagulation profile Recent Labs  Lab 01/31/23 2023  INR 1.1   COVID-19 Labs  No results for input(s): "DDIMER", "FERRITIN", "LDH", "CRP" in the last 72 hours.  Lab Results  Component Value Date   SARSCOV2NAA POSITIVE (A) 02/01/2023   SARSCOV2NAA NEGATIVE 05/20/2022   SARSCOV2NAA POSITIVE (A) 06/14/2020    CBC: Recent Labs  Lab 01/31/23 2023 02/01/23 0500  WBC 6.7 8.0  NEUTROABS 5.2  --   HGB 14.5 14.4  HCT 42.8 43.0  MCV 89.0 89.4  PLT 251 230   Cardiac Enzymes: No results for input(s): "CKTOTAL", "CKMB", "CKMBINDEX", "TROPONINI" in the last 168 hours. BNP (last 3 results) No results for input(s): "PROBNP" in the last 8760 hours. CBG: No results for input(s): "GLUCAP" in the last 168 hours. D-Dimer: No results for input(s): "DDIMER" in the last 72 hours. Hgb A1c: No results for input(s): "HGBA1C" in the last 72 hours. Lipid Profile: No results  for input(s): "CHOL", "HDL", "LDLCALC", "TRIG", "CHOLHDL", "LDLDIRECT" in the last 72 hours. Thyroid function studies: No results for input(s): "TSH", "T4TOTAL", "T3FREE", "THYROIDAB" in the last 72 hours.  Invalid input(s): "FREET3" Anemia work up: No results for input(s): "VITAMINB12", "FOLATE", "FERRITIN", "TIBC", "IRON", "RETICCTPCT" in the last 72 hours. Sepsis Labs: Recent Labs  Lab 01/31/23 2023 01/31/23 2204 01/31/23 2318 01/31/23 2351 02/01/23 0500  WBC 6.7  --   --   --  8.0  LATICACIDVEN  --  2.4* 2.3* 1.6  --    Microbiology Recent Results (from the past 240 hour(s))  Blood Culture (routine x 2)     Status: None (Preliminary result)   Collection Time: 01/31/23  8:27 PM   Specimen: BLOOD RIGHT ARM  Result Value Ref Range Status   Specimen Description   Final    BLOOD RIGHT ARM Performed at  [facility], [address] [place], [place] [address]    Special Requests   Final    BOTTLES DRAWN AEROBIC AND ANAEROBIC Blood Culture adequate volume Performed at [facility], [address] [place], [place] [address]    Culture PENDING  Incomplete   Report Status PENDING  Incomplete  Resp panel by RT-PCR (RSV, Flu A&B, Covid) Anterior Nasal Swab     Status: Abnormal   Collection Time: 02/01/23 12:36 AM   Specimen: Anterior Nasal Swab  Result Value Ref Range Status   SARS Coronavirus 2 by RT PCR POSITIVE (A) NEGATIVE Final    Comment: (NOTE) SARS-CoV-2 target nucleic acids are DETECTED.  The SARS-CoV-2 RNA is generally detectable in upper respiratory specimens during the acute phase of infection. Positive results are indicative of the presence of the identified virus, but do not rule out bacterial infection or co-infection with other pathogens not detected by the test. Clinical correlation with patient history and other diagnostic information is necessary to determine patient infection status. The expected result is Negative.  Fact Sheet  for Patients: BloggerCourse.com  Fact Sheet for Healthcare Providers: SeriousBroker.it  This test is not yet approved or cleared by the [place] FDA and  has been authorized for detection and/or diagnosis of SARS-CoV-2 by FDA under an Emergency Use Authorization (EUA).  This EUA will remain in effect (meaning this test can be used) for the duration of  the COVID-19 declaration under Section 564(b)(1) of the A ct, 21 U.S.C. section 360bbb-3(b)(1), unless the authorization is terminated or revoked sooner.     Influenza A by PCR NEGATIVE NEGATIVE Final   Influenza B by PCR NEGATIVE NEGATIVE Final    Comment: (NOTE) The Xpert Xpress SARS-CoV-2/FLU/RSV plus assay is intended as an aid in the diagnosis of influenza from Nasopharyngeal swab specimens and should not be used as a sole basis for treatment. Nasal washings and aspirates are unacceptable for Xpert Xpress SARS-CoV-2/FLU/RSV testing.  Fact Sheet for Patients: BloggerCourse.com  Fact Sheet for Healthcare Providers: SeriousBroker.it  This test is not yet approved or cleared by the [place] FDA and has been authorized for detection and/or diagnosis of SARS-CoV-2 by FDA under an Emergency Use Authorization (EUA). This EUA will remain in effect (meaning this test can be used) for the duration of the COVID-19 declaration under Section 564(b)(1) of the Act, 21 U.S.C. section 360bbb-3(b)(1), unless the authorization is terminated or revoked.     Resp Syncytial Virus by PCR NEGATIVE NEGATIVE Final    Comment: (NOTE) Fact Sheet for Patients: BloggerCourse.com  Fact Sheet for Healthcare Providers: SeriousBroker.it  This test is not yet approved or cleared by the [place] FDA and has been authorized for detection and/or diagnosis of SARS-CoV-2 by FDA under an Emergency  Use Authorization (EUA). This EUA will remain in effect (meaning this test can be used) for the duration of the COVID-19 declaration under Section 564(b)(1) of the Act, 21 U.S.C. section 360bbb-3(b)(1), unless the authorization is terminated or revoked.  Performed at [facility], [address] [place], [place] [address]      Medications:    aspirin  81 mg Oral Daily   clopidogrel  75 mg Oral Daily   enoxaparin (LOVENOX) injection  40 mg Subcutaneous Q24H   famotidine  20 mg Oral BID   insulin aspart  0-6 Units Subcutaneous TID WC   lisinopril  40 mg Oral Daily   rosuvastatin  10 mg Oral QPM  sodium chloride flush  3 mL Intravenous Q12H   Continuous Infusions:  sodium chloride     cefTRIAXone (ROCEPHIN)  IV 2 g (02/01/23 0540)      LOS: 1 day   Marinda Elk  [facility]  02/01/2023, 6:57 AM

## 2023-02-01 NOTE — Evaluation (Signed)
Physical Therapy Evaluation Patient Details Name: Joel Stanley MRN: [id number] DOB: Apr 16, 1941 Today's Date: 02/01/2023  History of Present Illness  82 yo male admitted with SIRS, acute met encephalopathy, urinary retention, COVID. Hx of COVID, CAD, DM, CKD, MI, hernia repair, bil TKAs, OSA, sinus bradycardia, angina  Clinical Impression  On eval, pt required Mod A for mobility. He walked ~10 feet around the room. He is unsteady and at risk for falls when mobilizing. Pt present with general weakness, decreased activity tolerance, and impaired gait and balance. Will plan to progress activity as tolerated. Patient will benefit from continued inpatient follow up therapy, <3 hours/day, if he is agreeable.        If plan is discharge home, recommend the following: A lot of help with bathing/dressing/bathroom;A lot of help with walking and/or transfers;Assistance with cooking/housework;Assist for transportation;Help with stairs or ramp for entrance   Can travel by private vehicle   Yes    Equipment Recommendations None recommended by PT  Recommendations for Other Services  OT consult    Functional Status Assessment Patient has had a recent decline in their functional status and demonstrates the ability to make significant improvements in function in a reasonable and predictable amount of time.     Precautions / Restrictions Precautions Precautions: Fall Restrictions Weight Bearing Restrictions: No      Mobility  Bed Mobility Overal bed mobility: Needs Assistance Bed Mobility: Supine to Sit, Sit to Supine     Supine to sit: Mod assist, HOB elevated, Used rails Sit to supine: Mod assist, Used rails, HOB elevated   General bed mobility comments: Increased time. Task effortful for pt. Assist for trunk, LEs, and positioning at EOB.    Transfers Overall transfer level: Needs assistance Equipment used:  (IV pole) Transfers: Sit to/from Stand Sit to Stand: Min assist, From  elevated surface           General transfer comment: Assist to power up, stabilize, control descent. Increased time. Cues for safety.    Ambulation/Gait Ambulation/Gait assistance: Min assist Gait Distance (Feet): 10 Feet Assistive device: IV Pole (2 hands on IV pole for support) Gait Pattern/deviations: Decreased step length - right, Decreased step length - left, Decreased stride length       General Gait Details: No RW in room at time of eval. Unsteady. At risk for falls. Pt used 2 hand support on IV pole to take a short walk around the room. Fatigues easily.  Stairs            Wheelchair Mobility     Tilt Bed    Modified Rankin (Stroke Patients Only)       Balance Overall balance assessment: Needs assistance         Standing balance support: Bilateral upper extremity supported, During functional activity Standing balance-Leahy Scale: Poor                               Pertinent Vitals/Pain Pain Assessment Pain Assessment: No/denies pain    Home Living Family/patient expects to be discharged to:: Private residence Living Arrangements: Alone Available Help at Discharge: Family;Available PRN/intermittently Type of Home: House Home Access: Stairs to enter       Home Layout: One level Home Equipment: Agricultural consultant (2 wheels);Cane - single point      Prior Function Prior Level of Function : Independent/Modified Independent             Mobility  Comments: no device inside home. walker outside of home ADLs Comments: girlfriend assisted wth bathing, dressing     Extremity/Trunk Assessment   Upper Extremity Assessment Upper Extremity Assessment: Defer to OT evaluation    Lower Extremity Assessment Lower Extremity Assessment: Generalized weakness    Cervical / Trunk Assessment Cervical / Trunk Assessment: Normal  Communication   Communication Communication: No apparent difficulties  Cognition Arousal: Alert Behavior During  Therapy: WFL for tasks assessed/performed Overall Cognitive Status: Within Functional Limits for tasks assessed                                          General Comments      Exercises     Assessment/Plan    PT Assessment Patient needs continued PT services  PT Problem List Decreased strength;Decreased activity tolerance;Decreased balance;Decreased mobility;Decreased knowledge of use of DME;Pain       PT Treatment Interventions DME instruction;Gait training;Functional mobility training;Patient/family education;Therapeutic activities;Therapeutic exercise;Balance training    PT Goals (Current goals can be found in the Care Plan section)  Acute Rehab PT Goals Patient Stated Goal: to be able to get back home; to regain independence PT Goal Formulation: With patient Time For Goal Achievement: 02/15/23 Potential to Achieve Goals: Good    Frequency Min 1X/week     Co-evaluation               AM-PAC PT "6 Clicks" Mobility  Outcome Measure Help needed turning from your back to your side while in a flat bed without using bedrails?: A Lot Help needed moving from lying on your back to sitting on the side of a flat bed without using bedrails?: A Lot Help needed moving to and from a bed to a chair (including a wheelchair)?: A Lot Help needed standing up from a chair using your arms (e.g., wheelchair or bedside chair)?: A Lot Help needed to walk in hospital room?: A Lot Help needed climbing 3-5 steps with a railing? : A Lot 6 Click Score: 12    End of Session Equipment Utilized During Treatment: Gait belt Activity Tolerance: Patient limited by fatigue Patient left: in bed;with call bell/phone within reach;with bed alarm set   PT Visit Diagnosis: Muscle weakness (generalized) (M62.81);Difficulty in walking, not elsewhere classified (R26.2)    Time: 1610-9604 PT Time Calculation (min) (ACUTE ONLY): 21 min   Charges:   PT Evaluation $PT Eval Low  Complexity: 1 Low   PT General Charges $$ ACUTE PT VISIT: 1 Visit           Faye Ramsay, PT Acute Rehabilitation  Office: [phone]

## 2023-02-01 NOTE — ED Notes (Signed)
Urology cart ready in the pt room

## 2023-02-01 NOTE — ED Notes (Signed)
ED TO INPATIENT HANDOFF REPORT  ED Nurse Name and Phone #:   S Name/Age/Gender Joel Stanley 82 y.o. male Room/Bed: WA10/WA10  Code Status   Code Status: Full Code  Home/SNF/Other Home Patient oriented to: self, place, time, and situation Is this baseline? Yes   Triage Complete: Triage complete  Chief Complaint SIRS (systemic inflammatory response syndrome) (HCC) [R65.10]  Triage Note Pt BIB [facility] from home. Pt family called for pt having high blood pressure, hyperglycemia, and tachycardia. Pt having increased weakness throughout the past two days. Hx MI,  hypertension, Diabetes. Aox4.   End tidal 20  RR24 143/88 CBG 220 94% 2L [place] 112HR    Allergies Allergies  Allergen Reactions   Yellow Jacket Venom    Yellow Jacket Venom     Level of Care/Admitting Diagnosis ED Disposition     ED Disposition  Admit   Condition  --   Comment  Hospital Area: [facility] [[id number]]  Level of Care: Telemetry [5]  Admit to tele based on following criteria: Other see comments  Comments: Monitor for arrhythmia  May admit patient to [facility] or [facility] if equivalent level of care is available:: No  Covid Evaluation: Asymptomatic - no recent exposure (last 10 days) testing not required  Diagnosis: SIRS (systemic inflammatory response syndrome) [facility]) [[id number]]  Admitting Physician: Tereasa Coop [[id number]]  Attending Physician: Tereasa Coop [[id number]]  Certification:: I certify this patient will need inpatient services for at least 2 midnights  Estimated Length of Stay: 5          B Medical/Surgery History Past Medical History:  Diagnosis Date   Coronary artery disease    Exertional angina    Family history of heart disease    Hyperlipidemia    Hypertension    Obstructive sleep apnea    Type 2 diabetes mellitus (HCC)    Past Surgical History:  Procedure Laterality Date   APPENDECTOMY  1969   CARDIAC CATHETERIZATION  08/21/2010   PLA  75% fairly focal "in-stent" restenosis-cutting balloon atherectomy performed, expanded to 10 atm, resutling in reductin to 20% without dissection. PDA 90% proximal stenosis-stented with a 2.5x40mm Promus Element stent, deployed at 14 atm (2.38mm, resulting in reduction of 90% to 0% residual).   CARDIAC CATHETERIZATION  05/03/2008   PLA 100% occluded proximal stenosis-stented with a 2.75x75mm Promus stent deployed at 14 atm, resulting in reduction of 100% to 0% residual   CARDIAC CATHETERIZATION  01/27/2005   RCA 80-85% stenosis, stenting performed with a 3.5x33mm Taxus DES, dilated to 3.75 with a 0% residual.   CARDIOVASCULAR STRESS TEST  07/16/2009   Mild-moderate perfusion defect seen in Basal inferior and Mid inferior regions. No scitigraphic evidence of inducible myocardial ischemia. No ECG changes. EKG negative for ischemia. Post-stress EF 56%.   CHOLECYSTECTOMY  1985   HERNIA REPAIR  1993, 2003   LEFT HEART CATH AND CORONARY ANGIOGRAPHY N/A 12/14/2017   Procedure: LEFT HEART CATH AND CORONARY ANGIOGRAPHY;  Surgeon: [name], MD;  Location: [facility] INVASIVE CV LAB;  Service: Cardiovascular;  Laterality: N/A;   TONSILLECTOMY  1946   TOTAL KNEE ARTHROPLASTY Left 2004     A IV Location/Drains/Wounds Patient Lines/Drains/Airways Status     Active Line/Drains/Airways     Name Placement date Placement time Site Days   Peripheral IV 01/31/23 18 G Anterior;Distal;Left;Upper Arm 01/31/23  2014  Arm  1   Peripheral IV 02/01/23 20 G Anterior;Distal;Right;Upper Arm 02/01/23  0355  Arm  less than  1            Intake/Output Last 24 hours  Intake/Output Summary (Last 24 hours) at 02/01/2023 0735 Last data filed at 02/01/2023 0718 Gross per 24 hour  Intake 1100 ml  Output 1150 ml  Net -50 ml    Labs/Imaging Results for orders placed or performed during the hospital encounter of 01/31/23 (from the past 48 hour(s))  Comprehensive metabolic panel     Status: Abnormal   Collection Time:  01/31/23  8:23 PM  Result Value Ref Range   Sodium 133 (L) 135 - 145 mmol/L   Potassium 4.0 3.5 - 5.1 mmol/L   Chloride 100 98 - 111 mmol/L   CO2 22 22 - 32 mmol/L   Glucose, Bld 191 (H) 70 - 99 mg/dL    Comment: Glucose reference range applies only to samples taken after fasting for at least 8 hours.   BUN 15 8 - 23 mg/dL   Creatinine, Ser 0.10 0.61 - 1.24 mg/dL   Calcium 8.8 (L) 8.9 - 10.3 mg/dL   Total Protein 7.5 6.5 - 8.1 g/dL   Albumin 4.1 3.5 - 5.0 g/dL   AST 18 15 - 41 U/L   ALT 13 0 - 44 U/L   Alkaline Phosphatase 50 38 - 126 U/L   Total Bilirubin 1.2 0.3 - 1.2 mg/dL   GFR, Estimated >27 >25 mL/min    Comment: (NOTE) Calculated using the CKD-EPI Creatinine Equation (2021)    Anion gap 11 5 - 15    Comment: Performed at [facility], [address], [place], [place] [address]  CBC with Differential     Status: None   Collection Time: 01/31/23  8:23 PM  Result Value Ref Range   WBC 6.7 4.0 - 10.5 K/uL   RBC 4.81 4.22 - 5.81 MIL/uL   Hemoglobin 14.5 13.0 - 17.0 g/dL   HCT 03.4 74.2 - 59.5 %   MCV 89.0 80.0 - 100.0 fL   MCH 30.1 26.0 - 34.0 pg   MCHC 33.9 30.0 - 36.0 g/dL   RDW 63.8 75.6 - 43.3 %   Platelets 251 150 - 400 K/uL   nRBC 0.0 0.0 - 0.2 %   Neutrophils Relative % 77 %   Neutro Abs 5.2 1.7 - 7.7 K/uL   Lymphocytes Relative 13 %   Lymphs Abs 0.9 0.7 - 4.0 K/uL   Monocytes Relative 9 %   Monocytes Absolute 0.6 0.1 - 1.0 K/uL   Eosinophils Relative 1 %   Eosinophils Absolute 0.0 0.0 - 0.5 K/uL   Basophils Relative 0 %   Basophils Absolute 0.0 0.0 - 0.1 K/uL   Immature Granulocytes 0 %   Abs Immature Granulocytes 0.01 0.00 - 0.07 K/uL    Comment: Performed at [facility], [address] [place], [place] [address]  Protime-INR     Status: None   Collection Time: 01/31/23  8:23 PM  Result Value Ref Range   Prothrombin Time 13.9 11.4 - 15.2 seconds   INR 1.1 0.8 - 1.2    Comment: (NOTE) INR goal varies based on  device and disease states. Performed at [facility], [address], [place], [place] [address]   APTT     Status: None   Collection Time: 01/31/23  8:23 PM  Result Value Ref Range   aPTT 33 24 - 36 seconds    Comment: Performed at [facility], [address] [place], [place] [address]  Urinalysis, w/ Reflex to Culture (Infection Suspected) -Urine, Clean Catch     Status: Abnormal   Collection Time: 01/31/23  8:23 PM  Result Value Ref Range   Specimen Source URINE, CLEAN CATCH    Color, Urine STRAW (A) YELLOW   APPearance CLEAR CLEAR   Specific Gravity, Urine 1.009 1.005 - 1.030   pH 5.0 5.0 - 8.0   Glucose, UA NEGATIVE NEGATIVE mg/dL   Hgb urine dipstick SMALL (A) NEGATIVE   Bilirubin Urine NEGATIVE NEGATIVE   Ketones, ur NEGATIVE NEGATIVE mg/dL   Protein, ur 30 (A) NEGATIVE mg/dL   Nitrite NEGATIVE NEGATIVE   Leukocytes,Ua NEGATIVE NEGATIVE   RBC / HPF 0-5 0 - 5 RBC/hpf   WBC, UA 0-5 0 - 5 WBC/hpf    Comment:        Reflex urine culture not performed if WBC <=10, OR if Squamous epithelial cells >5. If Squamous epithelial cells >5 suggest recollection.    Bacteria, UA NONE SEEN NONE SEEN   Squamous Epithelial / HPF 0-5 0 - 5 /HPF   Mucus PRESENT     Comment: Performed at [facility], [address] [place], [place] [address]  Blood Culture (routine x 2)     Status: None (Preliminary result)   Collection Time: 01/31/23  8:27 PM   Specimen: BLOOD RIGHT ARM  Result Value Ref Range   Specimen Description      BLOOD RIGHT ARM Performed at [facility], [address] [place], [place] [address]    Special Requests      BOTTLES DRAWN AEROBIC AND ANAEROBIC Blood Culture adequate volume Performed at [facility], [address], [place], [place] [address]    Culture PENDING    Report Status PENDING   I-Stat Lactic Acid, ED     Status: Abnormal   Collection Time: 01/31/23 10:04 PM   Result Value Ref Range   Lactic Acid, Venous 2.4 (HH) 0.5 - 1.9 mmol/L   Comment NOTIFIED PHYSICIAN   I-Stat Lactic Acid, ED     Status: Abnormal   Collection Time: 01/31/23 11:18 PM  Result Value Ref Range   Lactic Acid, Venous 2.3 (HH) 0.5 - 1.9 mmol/L  Lactic acid, plasma     Status: None   Collection Time: 01/31/23 11:51 PM  Result Value Ref Range   Lactic Acid, Venous 1.6 0.5 - 1.9 mmol/L    Comment: Performed at [facility], [address], [place], [place] [address]  Resp panel by RT-PCR (RSV, Flu A&B, Covid) Anterior Nasal Swab     Status: Abnormal   Collection Time: 02/01/23 12:36 AM   Specimen: Anterior Nasal Swab  Result Value Ref Range   SARS Coronavirus 2 by RT PCR POSITIVE (A) NEGATIVE    Comment: (NOTE) SARS-CoV-2 target nucleic acids are DETECTED.  The SARS-CoV-2 RNA is generally detectable in upper respiratory specimens during the acute phase of infection. Positive results are indicative of the presence of the identified virus, but do not rule out bacterial infection or co-infection with other pathogens not detected by the test. Clinical correlation with patient history and other diagnostic information is necessary to determine patient infection status. The expected result is Negative.  Fact Sheet for Patients: BloggerCourse.com  Fact Sheet for Healthcare Providers: SeriousBroker.it  This test is not yet approved or cleared by the [place] FDA and  has been authorized for detection and/or diagnosis of SARS-CoV-2 by FDA under an Emergency Use Authorization (EUA).  This EUA will remain in effect (meaning this test can be used) for the duration of  the COVID-19 declaration under Section 564(b)(1) of the A ct, 21 U.S.C. section 360bbb-3(b)(1), unless the authorization is terminated or revoked sooner.     Influenza A by PCR NEGATIVE NEGATIVE   Influenza B by PCR NEGATIVE NEGATIVE     Comment: (NOTE) The Xpert Xpress SARS-CoV-2/FLU/RSV plus assay is intended as an aid in the diagnosis of influenza from Nasopharyngeal swab specimens and should not be used as a sole basis for treatment. Nasal washings and aspirates are unacceptable for Xpert Xpress SARS-CoV-2/FLU/RSV testing.  Fact Sheet for Patients: BloggerCourse.com  Fact Sheet for Healthcare Providers: SeriousBroker.it  This test is not yet approved or cleared by the [place] FDA and has been authorized for detection and/or diagnosis of SARS-CoV-2 by FDA under an Emergency Use Authorization (EUA). This EUA will remain in effect (meaning this test can be used) for the duration of the COVID-19 declaration under Section 564(b)(1) of the Act, 21 U.S.C. section 360bbb-3(b)(1), unless the authorization is terminated or revoked.     Resp Syncytial Virus by PCR NEGATIVE NEGATIVE    Comment: (NOTE) Fact Sheet for Patients: BloggerCourse.com  Fact Sheet for Healthcare Providers: SeriousBroker.it  This test is not yet approved or cleared by the [place] FDA and has been authorized for detection and/or diagnosis of SARS-CoV-2 by FDA under an Emergency Use Authorization (EUA). This EUA will remain in effect (meaning this test can be used) for the duration of the COVID-19 declaration under Section 564(b)(1) of the Act, 21 U.S.C. section 360bbb-3(b)(1), unless the authorization is terminated or revoked.  Performed at [facility], [address], [place], [place] [address]   Comprehensive metabolic panel     Status: Abnormal   Collection Time: 02/01/23  5:00 AM  Result Value Ref Range   Sodium 134 (L) 135 - 145 mmol/L   Potassium 3.6 3.5 - 5.1 mmol/L   Chloride 100 98 - 111 mmol/L   CO2 21 (L) 22 - 32 mmol/L   Glucose, Bld 260 (H) 70 - 99 mg/dL    Comment: Glucose reference range  applies only to samples taken after fasting for at least 8 hours.   BUN 16 8 - 23 mg/dL   Creatinine, Ser 6.04 0.61 - 1.24 mg/dL   Calcium 8.9 8.9 - 54.0 mg/dL   Total Protein 7.2 6.5 - 8.1 g/dL   Albumin 3.8 3.5 - 5.0 g/dL   AST 16 15 - 41 U/L   ALT 15 0 - 44 U/L   Alkaline Phosphatase 46 38 - 126 U/L   Total Bilirubin 1.1 0.3 - 1.2 mg/dL   GFR, Estimated >98 >11 mL/min    Comment: (NOTE) Calculated using the CKD-EPI Creatinine Equation (2021)    Anion gap 13 5 - 15    Comment: Performed at [facility], [address] [place], [place] [address]  CBC     Status: None   Collection Time: 02/01/23  5:00 AM  Result Value Ref Range   WBC 8.0 4.0 - 10.5 K/uL   RBC 4.81 4.22 - 5.81 MIL/uL   Hemoglobin 14.4 13.0 - 17.0 g/dL   HCT 29.5 62.1 - 30.8 %   MCV 89.4 80.0 - 100.0 fL   MCH 29.9 26.0 - 34.0 pg   MCHC 33.5 30.0 - 36.0 g/dL   RDW 65.7 84.6 - 96.2 %   Platelets 230 150 - 400 K/uL  nRBC 0.0 0.0 - 0.2 %    Comment: Performed at [facility], [address], [place], [place] [address]  Urinalysis, Routine w reflex microscopic -Urine, Catheterized     Status: Abnormal   Collection Time: 02/01/23  5:11 AM  Result Value Ref Range   Color, Urine YELLOW YELLOW   APPearance HAZY (A) CLEAR   Specific Gravity, Urine 1.009 1.005 - 1.030   pH 5.0 5.0 - 8.0   Glucose, UA >=500 (A) NEGATIVE mg/dL   Hgb urine dipstick LARGE (A) NEGATIVE   Bilirubin Urine NEGATIVE NEGATIVE   Ketones, ur 20 (A) NEGATIVE mg/dL   Protein, ur 604 (A) NEGATIVE mg/dL   Nitrite NEGATIVE NEGATIVE   Leukocytes,Ua NEGATIVE NEGATIVE   RBC / HPF >50 0 - 5 RBC/hpf   WBC, UA 11-20 0 - 5 WBC/hpf   Bacteria, UA RARE (A) NONE SEEN   Squamous Epithelial / HPF 0-5 0 - 5 /HPF   Mucus PRESENT     Comment: Performed at [facility], [address] [place], [place] [address]   DG Chest Port 1 View  Result Date: 01/31/2023 CLINICAL DATA:  Hypertension and  tachycardia EXAM: PORTABLE CHEST 1 VIEW COMPARISON:  05/20/2022 FINDINGS: Cardiac shadow is stable. Lungs are well aerated bilaterally. No focal infiltrate or effusion is seen. No bony abnormality is noted. IMPRESSION: No acute abnormality noted. Electronically Signed   By: Alcide Clever M.D.   On: 01/31/2023 21:42    Pending Labs Unresulted Labs (From admission, onward)     Start     Ordered   02/01/23 0500  Comprehensive metabolic panel  Daily,   R      01/31/23 2357   02/01/23 0500  CBC  Daily,   R      01/31/23 2357   02/01/23 0500  Hemoglobin A1c  Tomorrow morning,   R        02/01/23 0124   02/01/23 0116  Protime-INR  Add-on,   AD        02/01/23 0115   02/01/23 0115  APTT  Add-on,   AD        02/01/23 0115   01/31/23 2023  Blood Culture (routine x 2)  (Septic presentation on arrival (screening labs, nursing and treatment orders for obvious sepsis))  BLOOD CULTURE X 2,   STAT      01/31/23 2024            Vitals/Pain Today's Vitals   02/01/23 0608 02/01/23 0700 02/01/23 0730 02/01/23 0733  BP: 128/84 128/78    Pulse: (!) 102 96 96   Resp: 20 15 13    Temp: 98.2 F (36.8 C)   98.2 F (36.8 C)  TempSrc: Oral   Oral  SpO2: 93% 95% 98%   PainSc:        Isolation Precautions No active isolations  Medications Medications  aspirin chewable tablet 81 mg (has no administration in time range)  lisinopril (ZESTRIL) tablet 40 mg (has no administration in time range)  nitroGLYCERIN (NITROSTAT) SL tablet 0.4 mg (has no administration in time range)  rosuvastatin (CRESTOR) tablet 10 mg (has no administration in time range)  famotidine (PEPCID) tablet 20 mg (has no administration in time range)  clopidogrel (PLAVIX) tablet 75 mg (has no administration in time range)  enoxaparin (LOVENOX) injection 40 mg (has no administration in time range)  sodium chloride flush (NS) 0.9 % injection 3 mL (3 mLs Intravenous Given 02/01/23 0209)  sodium chloride  flush (NS) 0.9 % injection 3 mL  (has no administration in time range)  0.9 %  sodium chloride infusion (has no administration in time range)  acetaminophen (TYLENOL) tablet 650 mg (650 mg Oral Given 02/01/23 0408)    Or  acetaminophen (TYLENOL) suppository 650 mg ( Rectal See Alternative 02/01/23 0408)  ondansetron (ZOFRAN) tablet 4 mg (has no administration in time range)    Or  ondansetron (ZOFRAN) injection 4 mg (has no administration in time range)  cefTRIAXone (ROCEPHIN) 2 g in sodium chloride 0.9 % 100 mL IVPB (0 g Intravenous Stopped 02/01/23 0718)  insulin aspart (novoLOG) injection 0-6 Units (has no administration in time range)  hydrALAZINE (APRESOLINE) injection 10 mg (has no administration in time range)  tamsulosin (FLOMAX) capsule 0.4 mg (has no administration in time range)  lactated ringers bolus 1,000 mL (0 mLs Intravenous Stopped 01/31/23 2126)    And  lactated ringers bolus 1,000 mL (0 mLs Intravenous Stopped 02/01/23 0120)    And  lactated ringers bolus 1,000 mL (0 mLs Intravenous Stopped 02/01/23 0030)  ceFEPIme (MAXIPIME) 2 g in sodium chloride 0.9 % 100 mL IVPB (0 g Intravenous Stopped 01/31/23 2126)  vancomycin (VANCOREADY) IVPB 1500 mg/300 mL (0 mg Intravenous Stopped 02/01/23 0120)    Mobility walks     Focused Assessments    R Recommendations: See Admitting Provider Note  Report given to:   Additional Notes:

## 2023-02-02 DIAGNOSIS — R6510 Systemic inflammatory response syndrome (SIRS) of non-infectious origin without acute organ dysfunction: Secondary | ICD-10-CM | POA: Diagnosis not present

## 2023-02-02 DIAGNOSIS — A419 Sepsis, unspecified organism: Secondary | ICD-10-CM | POA: Diagnosis not present

## 2023-02-02 DIAGNOSIS — U071 COVID-19: Secondary | ICD-10-CM | POA: Diagnosis not present

## 2023-02-02 LAB — BLOOD CULTURE ID PANEL (REFLEXED) - BCID2

## 2023-02-02 LAB — CULTURE, BLOOD (ROUTINE X 2): Special Requests: ADEQUATE

## 2023-02-02 LAB — GLUCOSE, CAPILLARY
Glucose-Capillary: 195 mg/dL — ABNORMAL HIGH (ref 70–99)
Glucose-Capillary: 251 mg/dL — ABNORMAL HIGH (ref 70–99)
Glucose-Capillary: 196 mg/dL — ABNORMAL HIGH (ref 70–99)
Glucose-Capillary: 229 mg/dL — ABNORMAL HIGH (ref 70–99)

## 2023-02-02 NOTE — Progress Notes (Signed)
PHARMACY - PHYSICIAN COMMUNICATION CRITICAL VALUE ALERT - BLOOD CULTURE IDENTIFICATION (BCID)  Joel Stanley is an 82 y.o. male who presented to [facility] on 01/31/2023 with a chief complaint of weakness. Patient unable to void, Foley placed with ~ 1 L urine out.  Assessment:  8/10 Bcx 1/4 bottles staph epi, no resistance Pt on Rocephin empirically Suspect contamination  Name of physician (or Provider) Contacted: Chinita Greenland, NP  Current antibiotics: Rocephin  Changes to prescribed antibiotics recommended: none  Results for orders placed or performed during the hospital encounter of 01/31/23  Blood Culture ID Panel (Reflexed) (Collected: 01/31/2023  8:28 PM)  Result Value Ref Range   Enterococcus faecalis NOT DETECTED NOT DETECTED   Enterococcus Faecium NOT DETECTED NOT DETECTED   Listeria monocytogenes NOT DETECTED NOT DETECTED   Staphylococcus species DETECTED (A) NOT DETECTED   Staphylococcus aureus (BCID) NOT DETECTED NOT DETECTED   Staphylococcus epidermidis DETECTED (A) NOT DETECTED   Staphylococcus lugdunensis NOT DETECTED NOT DETECTED   Streptococcus species NOT DETECTED NOT DETECTED   Streptococcus agalactiae NOT DETECTED NOT DETECTED   Streptococcus pneumoniae NOT DETECTED NOT DETECTED   Streptococcus pyogenes NOT DETECTED NOT DETECTED   A.calcoaceticus-baumannii NOT DETECTED NOT DETECTED   Bacteroides fragilis NOT DETECTED NOT DETECTED   Enterobacterales NOT DETECTED NOT DETECTED   Enterobacter cloacae complex NOT DETECTED NOT DETECTED   Escherichia coli NOT DETECTED NOT DETECTED   Klebsiella aerogenes NOT DETECTED NOT DETECTED   Klebsiella oxytoca NOT DETECTED NOT DETECTED   Klebsiella pneumoniae NOT DETECTED NOT DETECTED   Proteus species NOT DETECTED NOT DETECTED   Salmonella species NOT DETECTED NOT DETECTED   Serratia marcescens NOT DETECTED NOT DETECTED   Haemophilus influenzae NOT DETECTED NOT DETECTED   Neisseria meningitidis NOT DETECTED NOT  DETECTED   Pseudomonas aeruginosa NOT DETECTED NOT DETECTED   Stenotrophomonas maltophilia NOT DETECTED NOT DETECTED   Candida albicans NOT DETECTED NOT DETECTED   Candida auris NOT DETECTED NOT DETECTED   Candida glabrata NOT DETECTED NOT DETECTED   Candida krusei NOT DETECTED NOT DETECTED   Candida parapsilosis NOT DETECTED NOT DETECTED   Candida tropicalis NOT DETECTED NOT DETECTED   Cryptococcus neoformans/gattii NOT DETECTED NOT DETECTED   Methicillin resistance mecA/C NOT DETECTED NOT DETECTED    Pricilla Riffle, PharmD, BCPS Clinical Pharmacist 02/02/2023 1:31 AM

## 2023-02-02 NOTE — Inpatient Diabetes Management (Signed)
Inpatient Diabetes Program Recommendations  AACE/ADA: New Consensus Statement on Inpatient Glycemic Control (2015)  Target Ranges:  Prepandial:   less than 140 mg/dL      Peak postprandial:   less than 180 mg/dL (1-2 hours)      Critically ill patients:  140 - 180 mg/dL   Lab Results  Component Value Date   GLUCAP 251 (H) 02/02/2023   HGBA1C 7.1 (H) 02/01/2023    Review of Glycemic Control  Latest Reference Range & Units 02/01/23 08:52 02/01/23 11:55 02/01/23 16:18 02/01/23 21:03 02/02/23 08:18 02/02/23 11:54  Glucose-Capillary 70 - 99 mg/dL 469 (H) 629 (H) 528 (H) 202 (H) 195 (H) 251 (H)  (H): Data is abnormally high  Diabetes history: DM2 Outpatient Diabetes medications: Metformin 1000 mg BID Current orders for Inpatient glycemic control: Novolog 0-6 units TID  Inpatient Diabetes Program Recommendations:    Please consider:  Semglee 8 units every day (0.1 units/kg)  Will continue to follow while inpatient.  Thank you, Dulce Sellar, MSN, CDCES Diabetes Coordinator Inpatient Diabetes Program [phone] (team pager from 8a-5p)

## 2023-02-02 NOTE — Progress Notes (Signed)
[facility] PROGRESS NOTE    Progress Note  Joel Stanley  ION:[id number] DOB: [birth year] DOA: 01/31/2023 PCP: [facility]     Brief Narrative:   Joel Stanley is an 82 y.o. 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  Patient relates has not been able to void for the last several months got worse over the last several 2 days on the day of admission he cannot urinate   Assessment/Plan:   SIRS (systemic inflammatory response syndrome) (HCC) Unclear source infectious workup has been negative he did have elevated lactic acid and a temp of 100.3. Was fluid resuscitated started empirically on antibiotics and urine cultures as well as blood cultures are pending. SARS-CoV-2 is positive, chest x-ray is clear he relates no shortness of breath. PT OT has been consulted, out of bed to chair.  Acute urinary retention: Probably contributing to SIRS urology was consulted Foley catheter was placed about a liter of urine came out. Question due to BPH with pinkish urine likely due to mild traumatic catheter placement. Continue Flomax he will go home with a Foley will need to follow-up with urology as an outpatient.   Unfortunately urine cultures were not sent on admission, blood cultures are negative till date. Continue IV Rocephin  Acute metabolic encephalopathy: Of unclear source question multifactorial in the setting of acute urinary retention and infectious etiology.  Essential hypertension: Blood pressure elevated on admission agree with continuing lisinopril hydralazine IV as needed resume home meds.  History of CAD: Continue aspirin and Plavix and statins.  History of sick sinus syndrome sinus  node dysfunction: Metoprolol has been discontinued as an outpatient. He remains mildly tachycardic continue to observe question of tachycardia due to stress reaction.  Non-insulin-dependent diabetes mellitus type 2: With last A1c of 6.6 hold metformin continue sliding scale insulin.  Chronic kidney disease stage II: With baseline creatinine around 1.0. Catheter placed by urology good urine output.  DVT prophylaxis: lovenox Family Communication:none Status is: Inpatient Remains inpatient appropriate because: SIRS    Code Status:     Code Status Orders  (From admission, onward)           Start     Ordered   01/31/23 2354  Full code  Continuous       Question:  By:  Answer:  Consent: discussion documented in EHR   01/31/23 2355           Code Status History     Date Active Date Inactive Code Status Order ID Comments User Context   06/14/2020 2220 06/17/2020 1920 Full Code [id number]  Charlsie Quest, MD ED   12/13/2017 2255 12/14/2017 2113 Full Code [id number]  Briscoe Deutscher, MD ED         IV Access:   Peripheral IV   Procedures and diagnostic studies:   DG Chest Port 1 View  Result Date: 01/31/2023 CLINICAL DATA:  Hypertension and tachycardia EXAM: PORTABLE CHEST 1 VIEW COMPARISON:  05/20/2022 FINDINGS: Cardiac shadow is stable. Lungs are well aerated bilaterally. No focal infiltrate or effusion is seen. No bony abnormality is noted. IMPRESSION: No acute abnormality noted. Electronically Signed   By: Alcide Clever M.D.   On: 01/31/2023 21:42     Medical Consultants:   None.  Subjective:    Joel Stanley no new plaints this morning.  Objective:    Vitals:   02/01/23 1949 02/02/23 0500 02/02/23 0524 02/02/23 0836  BP:   136/82 (!) 142/91  Pulse: (!) 109  99 95  Resp:   19   Temp:   98.9 F (37.2 C)   TempSrc:   Oral   SpO2:   90%   Weight:  87.7 kg     SpO2: 90 %   Intake/Output Summary (Last 24 hours) at 02/02/2023 0932 Last data  filed at 02/02/2023 0538 Gross per 24 hour  Intake --  Output 1600 ml  Net -1600 ml   Filed Weights   02/02/23 0500  Weight: 87.7 kg    Exam: General exam: In no acute distress. Respiratory system: Good air movement and clear to auscultation. Cardiovascular system: S1 & S2 heard, RRR. No JVD. Gastrointestinal system: Abdomen is nondistended, soft and nontender.  Extremities: No pedal edema. Skin: No rashes, lesions or ulcers Psychiatry: Judgement and insight appear normal. Mood & affect appropriate.  Data Reviewed:    Labs: Basic Metabolic Panel: Recent Labs  Lab 01/31/23 2023 02/01/23 0500 02/02/23 0532  NA 133* 134* 134*  K 4.0 3.6 3.9  CL 100 100 99  CO2 22 21* 24  GLUCOSE 191* 260* 190*  BUN 15 16 20   CREATININE 1.08 0.98 1.15  CALCIUM 8.8* 8.9 9.1   GFR Estimated Creatinine Clearance: 56.2 mL/min (by C-G formula based on SCr of 1.15 mg/dL). Liver Function Tests: Recent Labs  Lab 01/31/23 2023 02/01/23 0500 02/02/23 0532  AST 18 16 15   ALT 13 15 14   ALKPHOS 50 46 41  BILITOT 1.2 1.1 0.6  PROT 7.5 7.2 6.4*  ALBUMIN 4.1 3.8 3.3*   No results for input(s): "LIPASE", "AMYLASE" in the last 168 hours. No results for input(s): "AMMONIA" in the last 168 hours. Coagulation profile Recent Labs  Lab 01/31/23 2023 02/01/23 1141  INR 1.1 1.1   COVID-19 Labs  No results for input(s): "DDIMER", "FERRITIN", "LDH", "CRP" in the last 72 hours.  Lab Results  Component Value Date   SARSCOV2NAA POSITIVE (A) 02/01/2023   SARSCOV2NAA NEGATIVE 05/20/2022   SARSCOV2NAA POSITIVE (A) 06/14/2020    CBC: Recent Labs  Lab 01/31/23 2023 02/01/23 0500 02/02/23 0532  WBC 6.7 8.0 5.6  NEUTROABS 5.2  --   --   HGB 14.5 14.4 14.1  HCT 42.8 43.0 43.2  MCV 89.0 89.4 92.5  PLT 251 230 216   Cardiac Enzymes: No results for input(s): "CKTOTAL", "CKMB", "CKMBINDEX", "TROPONINI" in the last 168 hours. BNP (last 3 results) No results for input(s): "PROBNP" in the last  8760 hours. CBG: Recent Labs  Lab 02/01/23 0852 02/01/23 1155 02/01/23 1618 02/01/23 2103 02/02/23 0818  GLUCAP 217* 203* 189* 202* 195*   D-Dimer: No results for input(s): "DDIMER" in the last 72 hours. Hgb A1c: No results for input(s): "HGBA1C" in the last 72 hours. Lipid Profile: No results for input(s): "CHOL", "HDL", "LDLCALC", "TRIG", "CHOLHDL", "LDLDIRECT" in the last 72 hours. Thyroid function studies: No results for input(s): "TSH", "T4TOTAL", "T3FREE", "THYROIDAB" in the last 72 hours.  Invalid input(s): "FREET3" Anemia work up: No results for input(s): "VITAMINB12", "FOLATE", "FERRITIN", "TIBC", "IRON", "RETICCTPCT" in the last 72 hours. Sepsis Labs: Recent Labs  Lab 01/31/23 2023 01/31/23 2204 01/31/23 2318 01/31/23 2351 02/01/23 0500 02/02/23 0532  WBC 6.7  --   --   --  8.0 5.6  LATICACIDVEN  --  2.4* 2.3* 1.6  --   --    Microbiology Recent Results (from the past 240 hour(s))  Blood Culture (routine x 2)     Status: None (Preliminary result)   Collection Time: 01/31/23  8:27 PM   Specimen: BLOOD RIGHT ARM  Result Value Ref Range Status   Specimen Description   Final    BLOOD RIGHT ARM Performed at [facility], [address], [place], [place] [address]    Special Requests   Final    BOTTLES DRAWN AEROBIC AND ANAEROBIC Blood Culture adequate volume   Culture   Final    NO GROWTH 1 DAY Performed at [facility], [address] [place], [place] [address]    Report Status PENDING  Incomplete  Blood Culture (routine x 2)     Status: None (Preliminary result)   Collection Time: 01/31/23  8:28 PM   Specimen: BLOOD LEFT ARM  Result Value Ref Range Status   Specimen Description   Final    BLOOD LEFT ARM Performed at [facility], [address], [place], [place] [address]    Special Requests   Final    BOTTLES DRAWN AEROBIC AND ANAEROBIC Blood Culture adequate volume Performed at [facility], [address], [place], [place] [address]    Culture  Setup Time   Final    GRAM POSITIVE COCCI IN CLUSTERS AEROBIC BOTTLE ONLY CRITICAL RESULT CALLED TO, READ BACK BY AND VERIFIED WITH: PHARMD A. ELLINGTON 02/02/23 @ 0119 BY AB Performed at [facility], [address], [place], [place] [address]    Culture GRAM POSITIVE COCCI  Final   Report Status PENDING  Incomplete  Blood Culture ID Panel (Reflexed)     Status: Abnormal   Collection Time: 01/31/23  8:28 PM  Result Value Ref Range Status   Enterococcus faecalis NOT DETECTED NOT DETECTED Final   Enterococcus Faecium NOT DETECTED NOT DETECTED Final   Listeria monocytogenes NOT DETECTED NOT DETECTED Final   Staphylococcus species DETECTED (A) NOT DETECTED Final    Comment: CRITICAL RESULT CALLED TO, READ BACK BY AND VERIFIED WITH: PHARMD A. ELLINGTON 02/02/23 @ 0119 BY AB    Staphylococcus aureus (BCID) NOT DETECTED NOT DETECTED Final   Staphylococcus epidermidis DETECTED (A) NOT DETECTED Final    Comment: CRITICAL RESULT CALLED TO, READ BACK BY AND VERIFIED WITH: PHARMD A. ELLINGTON 02/02/23 @ 0119 BY AB    Staphylococcus lugdunensis NOT DETECTED NOT DETECTED Final   Streptococcus species NOT DETECTED NOT DETECTED Final   Streptococcus agalactiae NOT DETECTED NOT DETECTED Final   Streptococcus pneumoniae NOT DETECTED NOT DETECTED Final   Streptococcus pyogenes NOT DETECTED NOT DETECTED Final   A.calcoaceticus-baumannii NOT DETECTED NOT DETECTED Final   Bacteroides fragilis NOT DETECTED NOT DETECTED Final   Enterobacterales NOT DETECTED NOT DETECTED Final   Enterobacter cloacae complex NOT DETECTED NOT DETECTED Final   Escherichia coli NOT DETECTED NOT DETECTED Final   Klebsiella aerogenes NOT DETECTED NOT DETECTED Final   Klebsiella oxytoca NOT DETECTED NOT DETECTED Final   Klebsiella pneumoniae NOT DETECTED NOT DETECTED Final   Proteus species NOT DETECTED NOT DETECTED Final   Salmonella species NOT DETECTED  NOT DETECTED Final   Serratia marcescens NOT DETECTED NOT DETECTED Final   Haemophilus influenzae NOT DETECTED NOT DETECTED Final   Neisseria meningitidis NOT DETECTED NOT DETECTED Final   Pseudomonas aeruginosa NOT DETECTED NOT DETECTED Final   Stenotrophomonas maltophilia NOT DETECTED NOT  DETECTED Final   Candida albicans NOT DETECTED NOT DETECTED Final   Candida auris NOT DETECTED NOT DETECTED Final   Candida glabrata NOT DETECTED NOT DETECTED Final   Candida krusei NOT DETECTED NOT DETECTED Final   Candida parapsilosis NOT DETECTED NOT DETECTED Final   Candida tropicalis NOT DETECTED NOT DETECTED Final   Cryptococcus neoformans/gattii NOT DETECTED NOT DETECTED Final   Methicillin resistance mecA/C NOT DETECTED NOT DETECTED Final    Comment: Performed at [facility], [address], [place], [place] [address]  Resp panel by RT-PCR (RSV, Flu A&B, Covid) Anterior Nasal Swab     Status: Abnormal   Collection Time: 02/01/23 12:36 AM   Specimen: Anterior Nasal Swab  Result Value Ref Range Status   SARS Coronavirus 2 by RT PCR POSITIVE (A) NEGATIVE Final    Comment: (NOTE) SARS-CoV-2 target nucleic acids are DETECTED.  The SARS-CoV-2 RNA is generally detectable in upper respiratory specimens during the acute phase of infection. Positive results are indicative of the presence of the identified virus, but do not rule out bacterial infection or co-infection with other pathogens not detected by the test. Clinical correlation with patient history and other diagnostic information is necessary to determine patient infection status. The expected result is Negative.  Fact Sheet for Patients: BloggerCourse.com  Fact Sheet for Healthcare Providers: SeriousBroker.it  This test is not yet approved or cleared by the [place] FDA and  has been authorized for detection and/or diagnosis of SARS-CoV-2 by FDA under an Emergency Use  Authorization (EUA).  This EUA will remain in effect (meaning this test can be used) for the duration of  the COVID-19 declaration under Section 564(b)(1) of the A ct, 21 U.S.C. section 360bbb-3(b)(1), unless the authorization is terminated or revoked sooner.     Influenza A by PCR NEGATIVE NEGATIVE Final   Influenza B by PCR NEGATIVE NEGATIVE Final    Comment: (NOTE) The Xpert Xpress SARS-CoV-2/FLU/RSV plus assay is intended as an aid in the diagnosis of influenza from Nasopharyngeal swab specimens and should not be used as a sole basis for treatment. Nasal washings and aspirates are unacceptable for Xpert Xpress SARS-CoV-2/FLU/RSV testing.  Fact Sheet for Patients: BloggerCourse.com  Fact Sheet for Healthcare Providers: SeriousBroker.it  This test is not yet approved or cleared by the [place] FDA and has been authorized for detection and/or diagnosis of SARS-CoV-2 by FDA under an Emergency Use Authorization (EUA). This EUA will remain in effect (meaning this test can be used) for the duration of the COVID-19 declaration under Section 564(b)(1) of the Act, 21 U.S.C. section 360bbb-3(b)(1), unless the authorization is terminated or revoked.     Resp Syncytial Virus by PCR NEGATIVE NEGATIVE Final    Comment: (NOTE) Fact Sheet for Patients: BloggerCourse.com  Fact Sheet for Healthcare Providers: SeriousBroker.it  This test is not yet approved or cleared by the [place] FDA and has been authorized for detection and/or diagnosis of SARS-CoV-2 by FDA under an Emergency Use Authorization (EUA). This EUA will remain in effect (meaning this test can be used) for the duration of the COVID-19 declaration under Section 564(b)(1) of the Act, 21 U.S.C. section 360bbb-3(b)(1), unless the authorization is terminated or revoked.  Performed at [facility], [address] [place], [place] [address]      Medications:    aspirin  81 mg Oral Daily   clopidogrel  75 mg Oral Daily   enoxaparin (LOVENOX) injection  40 mg Subcutaneous Q24H  famotidine  20 mg Oral BID   insulin aspart  0-6 Units Subcutaneous TID WC   lisinopril  40 mg Oral Daily   rosuvastatin  10 mg Oral QPM   sodium chloride flush  3 mL Intravenous Q12H   tamsulosin  0.4 mg Oral QPC breakfast   Continuous Infusions:  sodium chloride     cefTRIAXone (ROCEPHIN)  IV 2 g (02/02/23 0525)      LOS: 2 days   Marinda Elk  [facility]  02/02/2023, 9:32 AM

## 2023-02-02 NOTE — TOC Initial Note (Signed)
Transition of Care [facility]) - Initial/Assessment Note    Patient Details  Name: Joel Stanley MRN: [id number] Date of Birth: January 12, 1941  Transition [facility] CM/SW Contact:    Otelia Santee, LCSW Phone Number: 02/02/2023, 2:18 PM  Clinical Narrative:                 Met with pt to discuss current recommendation to SNF. Pt informed that due to COVID + status he would be required to complete 10 day isolation period prior to being able to transfer to SNF.  Pt shares he would prefer to return home with home health services. Pt currently lives with his significant other and has RW available at home. Pt denies need for further DME.  HHPT has been arranged with [facility]. HH order will need to be placed prior to discharge.   Expected Discharge Plan: Home w Home Health Services Barriers to Discharge: No Barriers Identified   Patient Goals and CMS Choice Patient states their goals for this hospitalization and ongoing recovery are:: To go home CMS Medicare.gov Compare Post Acute Care list provided to:: Patient Choice offered to / list presented to : Patient [place] ownership interest in [facility].provided to:: Patient    Expected Discharge Plan and Services In-house Referral: NA Discharge Planning Services: NA Post Acute Care Choice: Home Health Living arrangements for the past 2 months: Single Family Home                 DME Arranged: N/A DME Agency: NA       HH Arranged: PT HH Agency: [facility] Date HH Agency Contacted: 02/02/23 Time HH Agency Contacted: 1417 Representative spoke with at [facility] Agency: Angie  Prior Living Arrangements/Services Living arrangements for the past 2 months: Single Family Home Lives with:: Significant Other Patient language and need for interpreter reviewed:: Yes Do you feel safe going back to the place where you live?: Yes      Need for Family Participation in Patient Care: No (Comment) Care giver support system in  place?: No (comment) Current home services: DME (RW) Criminal Activity/Legal Involvement Pertinent to Current Situation/Hospitalization: No - Comment as needed  Activities of Daily Living Home Assistive Devices/Equipment: None ADL Screening (condition at time of admission) Patient's cognitive ability adequate to safely complete daily activities?: Yes Is the patient deaf or have difficulty hearing?: No Does the patient have difficulty seeing, even when wearing glasses/contacts?: No Does the patient have difficulty concentrating, remembering, or making decisions?: No Patient able to express need for assistance with ADLs?: No Does the patient have difficulty dressing or bathing?: No Does the patient have difficulty walking or climbing stairs?: No Weakness of Legs: None Weakness of Arms/Hands: None  Permission Sought/Granted   Permission granted to share information with : No              Emotional Assessment Appearance:: Appears stated age Attitude/Demeanor/Rapport: Engaged Affect (typically observed): Accepting Orientation: : Oriented to Self, Oriented to Place, Oriented to  Time, Oriented to Situation Alcohol / Substance Use: Not Applicable Psych Involvement: No (comment)  Admission diagnosis:  SIRS (systemic inflammatory response syndrome) (HCC) [R65.10] Sepsis, due to unspecified organism, unspecified whether acute organ dysfunction present (HCC) [A41.9] COVID-19 [U07.1] Patient Active Problem List   Diagnosis Date Noted   CKD (chronic kidney disease), stage II 02/01/2023   Acute metabolic encephalopathy 01/31/2023   SIRS (systemic inflammatory response syndrome) (HCC) 01/31/2023   Hyperglycemia related-hyponatremia 01/31/2023   History of sinus  bradycardia with sinus node dysfunction 07/29/2022   Right leg weakness 09/21/2020   Pneumonia due to COVID-19 virus 06/14/2020   Hyperlipidemia associated with type 2 diabetes mellitus (HCC) 06/14/2020   Generalized weakness  06/14/2020   CKD (chronic kidney disease), stage III (HCC) 12/13/2017   Chest pain 12/13/2017   Essential hypertension 03/02/2013   Hyperlipidemia 03/02/2013   Non-insulin dependent type 2 diabetes mellitus (HCC) 03/02/2013   OSA (obstructive sleep apnea) 03/02/2013   CAD (coronary artery disease) 04/12/2008   INCISIONAL HERNIA 04/12/2008   PERSONAL HISTORY OF COLONIC POLYPS 04/12/2008   PCP:  [facility]:   [facility] - [place], [place] - [address] [place] [address] Phone: [phone] Fax: [phone]     Social Determinants of Health (SDOH) Social History: SDOH Screenings   Food Insecurity: No Food Insecurity (02/01/2023)  Housing: Patient Unable To Answer (02/01/2023)  Transportation Needs: Patient Declined (02/01/2023)  Utilities: Patient Declined (02/01/2023)  Social Connections: Unknown (09/08/2022)   Received from [facility]  Tobacco Use: Medium Risk (01/31/2023)   SDOH Interventions:     Readmission Risk Interventions    02/02/2023    2:16 PM  Readmission Risk Prevention Plan  Post Dischage Appt Complete  Medication Screening Complete  Transportation Screening Complete

## 2023-02-02 NOTE — Plan of Care (Signed)
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
## 2023-02-03 DIAGNOSIS — U071 COVID-19: Secondary | ICD-10-CM | POA: Diagnosis not present

## 2023-02-03 DIAGNOSIS — R6510 Systemic inflammatory response syndrome (SIRS) of non-infectious origin without acute organ dysfunction: Secondary | ICD-10-CM | POA: Diagnosis not present

## 2023-02-03 DIAGNOSIS — A419 Sepsis, unspecified organism: Secondary | ICD-10-CM | POA: Diagnosis not present

## 2023-02-03 LAB — GLUCOSE, CAPILLARY
Glucose-Capillary: 184 mg/dL — ABNORMAL HIGH (ref 70–99)
Glucose-Capillary: 202 mg/dL — ABNORMAL HIGH (ref 70–99)
Glucose-Capillary: 213 mg/dL — ABNORMAL HIGH (ref 70–99)
Glucose-Capillary: 236 mg/dL — ABNORMAL HIGH (ref 70–99)

## 2023-02-03 MED ORDER — INSULIN ASPART 100 UNIT/ML IJ SOLN
1.0000 [IU] | Freq: Once | INTRAMUSCULAR | Status: AC
  Administered 2023-02-03: 1 [IU] via SUBCUTANEOUS

## 2023-02-03 MED ORDER — METFORMIN HCL 500 MG PO TABS
1000.0000 mg | ORAL_TABLET | Freq: Two times a day (BID) | ORAL | Status: DC
  Administered 2023-02-03 – 2023-02-06 (×7): 1000 mg via ORAL
  Filled 2023-02-03 (×7): qty 2

## 2023-02-03 NOTE — Progress Notes (Signed)
[facility] PROGRESS NOTE    Progress Note  Joel Stanley  UXN:[id number] DOB: 05/10/1941 DOA: 01/31/2023 PCP: [facility]     Brief Narrative:   Joel Stanley is an 82 y.o. 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  Patient relates has not been able to void for the last several months got worse over the last several 2 days on the day of admission he cannot urinate   Assessment/Plan:   SIRS (systemic inflammatory response syndrome) (HCC) Unclear source infectious workup has been negative he did have elevated lactic acid and a temp of 100.3. Was fluid resuscitated started empirically on antibiotics and urine cultures as well as blood cultures are pending. SARS-CoV-2 is positive, chest x-ray is clear he relates no shortness of breath. PT eval the patient recommended skilled nursing facility. He still debating whether to go home with home health therapy or  skilled nursing [facility]  Acute urinary retention: Probably contributing to SIRS urology was consulted Foley catheter was placed about a liter of urine came out. Urine has not cleared. Continue Flomax he will go home with a Foley will need to follow-up with urology as an outpatient.   Unfortunately urine cultures were not sent on admission, blood cultures are negative till date. Completed 3-day course of antibiotics  Traumatic hematuria: Hematuria is not clearing hold Lovenox and Plavix.  Hemoglobin has remained stable. Once hematuria is clearing he will probably be discharged. He still debating whether to go home with physical therapy or skilled nursing facility.  Acute metabolic encephalopathy: Of unclear source question  multifactorial in the setting of acute urinary retention and infectious etiology.  Essential hypertension: Blood pressure elevated on admission agree with continuing lisinopril hydralazine IV as needed resume home meds.  History of CAD: Continue aspirin and Plavix and statins.  History of sick sinus syndrome sinus node dysfunction: Metoprolol has been discontinued as an outpatient. He remains mildly tachycardic continue to observe question of tachycardia due to stress reaction.  Non-insulin-dependent diabetes mellitus type 2: With last A1c of 6.6, resume metformin  Chronic kidney disease stage II: With baseline creatinine around 1.0. Catheter placed by urology good urine output.  DVT prophylaxis: lovenox Family Communication:none Status is: Inpatient Remains inpatient appropriate because: SIRS    Code Status:     Code Status Orders  (From admission, onward)           Start     Ordered   01/31/23 2354  Full code  Continuous       Question:  By:  Answer:  Consent: discussion documented in EHR   01/31/23 2355           Code Status History     Date Active Date Inactive Code Status Order ID Comments User Context   06/14/2020 2220 06/17/2020 1920 Full Code [id number]  Charlsie Quest, MD ED   12/13/2017 2255 12/14/2017 2113 Full Code [id number]  Opyd, Lavone Neri, MD ED         IV Access:   Peripheral IV   Procedures and diagnostic studies:   No results found.   Medical Consultants:   None.   Subjective:    Joel Stanley no new plaints this morning.  Objective:    Vitals:   02/02/23  1400 02/02/23 2200 02/03/23 0500 02/03/23 0539  BP: (!) 140/80 (!) 140/94  131/83  Pulse: 88 (!) 107  90  Resp:  16    Temp: 98.2 F (36.8 C) 98.2 F (36.8 C)  98.3 F (36.8 C)  TempSrc: Oral Oral  Oral  SpO2: 96% 92%  95%  Weight:   89.3 kg    SpO2: 95 %   Intake/Output Summary (Last 24 hours) at 02/03/2023 0840 Last data filed at 02/03/2023  0537 Gross per 24 hour  Intake 360 ml  Output 2550 ml  Net -2190 ml   Filed Weights   02/02/23 0500 02/03/23 0500  Weight: 87.7 kg 89.3 kg    Exam: General exam: In no acute distress. Respiratory system: Good air movement and clear to auscultation. Cardiovascular system: S1 & S2 heard, RRR. No JVD. Gastrointestinal system: Abdomen is nondistended, soft and nontender.  Extremities: No pedal edema. Skin: No rashes, lesions or ulcers Psychiatry: Judgement and insight appear normal. Mood & affect appropriate.  Data Reviewed:    Labs: Basic Metabolic Panel: Recent Labs  Lab 01/31/23 2023 02/01/23 0500 02/02/23 0532 02/03/23 0536  NA 133* 134* 134* 135  K 4.0 3.6 3.9 3.9  CL 100 100 99 99  CO2 22 21* 24 24  GLUCOSE 191* 260* 190* 199*  BUN 15 16 20 22   CREATININE 1.08 0.98 1.15 1.16  CALCIUM 8.8* 8.9 9.1 8.9   GFR Estimated Creatinine Clearance: 56.2 mL/min (by C-G formula based on SCr of 1.16 mg/dL). Liver Function Tests: Recent Labs  Lab 01/31/23 2023 02/01/23 0500 02/02/23 0532 02/03/23 0536  AST 18 16 15 16   ALT 13 15 14 20   ALKPHOS 50 46 41 43  BILITOT 1.2 1.1 0.6 0.6  PROT 7.5 7.2 6.4* 6.6  ALBUMIN 4.1 3.8 3.3* 3.3*   No results for input(s): "LIPASE", "AMYLASE" in the last 168 hours. No results for input(s): "AMMONIA" in the last 168 hours. Coagulation profile Recent Labs  Lab 01/31/23 2023 02/01/23 1141  INR 1.1 1.1   COVID-19 Labs  No results for input(s): "DDIMER", "FERRITIN", "LDH", "CRP" in the last 72 hours.  Lab Results  Component Value Date   SARSCOV2NAA POSITIVE (A) 02/01/2023   SARSCOV2NAA NEGATIVE 05/20/2022   SARSCOV2NAA POSITIVE (A) 06/14/2020    CBC: Recent Labs  Lab 01/31/23 2023 02/01/23 0500 02/02/23 0532 02/03/23 0536  WBC 6.7 8.0 5.6 4.8  NEUTROABS 5.2  --   --   --   HGB 14.5 14.4 14.1 14.0  HCT 42.8 43.0 43.2 41.6  MCV 89.0 89.4 92.5 91.2  PLT 251 230 216 226   Cardiac Enzymes: No results for input(s):  "CKTOTAL", "CKMB", "CKMBINDEX", "TROPONINI" in the last 168 hours. BNP (last 3 results) No results for input(s): "PROBNP" in the last 8760 hours. CBG: Recent Labs  Lab 02/02/23 1154 02/02/23 1620 02/02/23 2157 02/03/23 0047 02/03/23 0755  GLUCAP 251* 196* 229* 184* 213*   D-Dimer: No results for input(s): "DDIMER" in the last 72 hours. Hgb A1c: Recent Labs    02/01/23 0500  HGBA1C 7.1*   Lipid Profile: No results for input(s): "CHOL", "HDL", "LDLCALC", "TRIG", "CHOLHDL", "LDLDIRECT" in the last 72 hours. Thyroid function studies: No results for input(s): "TSH", "T4TOTAL", "T3FREE", "THYROIDAB" in the last 72 hours.  Invalid input(s): "FREET3" Anemia work up: No results for input(s): "VITAMINB12", "FOLATE", "FERRITIN", "TIBC", "IRON", "RETICCTPCT" in the last 72 hours. Sepsis Labs: Recent Labs  Lab 01/31/23 2023 01/31/23 2204 01/31/23 2318 01/31/23 2351 02/01/23 0500  02/02/23 0532 02/03/23 0536  WBC 6.7  --   --   --  8.0 5.6 4.8  LATICACIDVEN  --  2.4* 2.3* 1.6  --   --   --    Microbiology Recent Results (from the past 240 hour(s))  Blood Culture (routine x 2)     Status: None (Preliminary result)   Collection Time: 01/31/23  8:27 PM   Specimen: BLOOD RIGHT ARM  Result Value Ref Range Status   Specimen Description   Final    BLOOD RIGHT ARM Performed at [facility], [address] [place], [place] [address]    Special Requests   Final    BOTTLES DRAWN AEROBIC AND ANAEROBIC Blood Culture adequate volume   Culture   Final    NO GROWTH 2 DAYS Performed at [facility], [address], [place], [place] [address]    Report Status PENDING  Incomplete  Blood Culture (routine x 2)     Status: Abnormal   Collection Time: 01/31/23  8:28 PM   Specimen: BLOOD LEFT ARM  Result Value Ref Range Status   Specimen Description   Final    BLOOD LEFT ARM Performed at [facility], [address], [place], [place] [address]     Special Requests   Final    BOTTLES DRAWN AEROBIC AND ANAEROBIC Blood Culture adequate volume Performed at [facility], [address] [place], [place] [address]    Culture  Setup Time   Final    GRAM POSITIVE COCCI IN CLUSTERS AEROBIC BOTTLE ONLY CRITICAL RESULT CALLED TO, READ BACK BY AND VERIFIED WITH: PHARMD A. ELLINGTON 02/02/23 @ 0119 BY AB    Culture (A)  Final    STAPHYLOCOCCUS HOMINIS THE SIGNIFICANCE OF ISOLATING THIS ORGANISM FROM A SINGLE SET OF BLOOD CULTURES WHEN MULTIPLE SETS ARE DRAWN IS UNCERTAIN. PLEASE NOTIFY THE MICROBIOLOGY DEPARTMENT WITHIN ONE WEEK IF SPECIATION AND SENSITIVITIES ARE REQUIRED. UNABLE TO ISOLATE STAPHYLOCOCCUS EPIDERMIDIS FROM CULTURE Performed at [facility], [address] [place], [place] [address]    Report Status 02/02/2023 FINAL  Final  Blood Culture ID Panel (Reflexed)     Status: Abnormal   Collection Time: 01/31/23  8:28 PM  Result Value Ref Range Status   Enterococcus faecalis NOT DETECTED NOT DETECTED Final   Enterococcus Faecium NOT DETECTED NOT DETECTED Final   Listeria monocytogenes NOT DETECTED NOT DETECTED Final   Staphylococcus species DETECTED (A) NOT DETECTED Final    Comment: CRITICAL RESULT CALLED TO, READ BACK BY AND VERIFIED WITH: PHARMD A. ELLINGTON 02/02/23 @ 0119 BY AB    Staphylococcus aureus (BCID) NOT DETECTED NOT DETECTED Final   Staphylococcus epidermidis DETECTED (A) NOT DETECTED Final    Comment: CRITICAL RESULT CALLED TO, READ BACK BY AND VERIFIED WITH: PHARMD A. ELLINGTON 02/02/23 @ 0119 BY AB    Staphylococcus lugdunensis NOT DETECTED NOT DETECTED Final   Streptococcus species NOT DETECTED NOT DETECTED Final   Streptococcus agalactiae NOT DETECTED NOT DETECTED Final   Streptococcus pneumoniae NOT DETECTED NOT DETECTED Final   Streptococcus pyogenes NOT DETECTED NOT DETECTED Final   A.calcoaceticus-baumannii NOT DETECTED NOT DETECTED Final   Bacteroides fragilis NOT DETECTED  NOT DETECTED Final   Enterobacterales NOT DETECTED NOT DETECTED Final   Enterobacter cloacae complex NOT DETECTED NOT DETECTED Final   Escherichia coli NOT DETECTED NOT DETECTED Final   Klebsiella aerogenes NOT DETECTED NOT DETECTED Final   Klebsiella oxytoca NOT DETECTED NOT DETECTED  Final   Klebsiella pneumoniae NOT DETECTED NOT DETECTED Final   Proteus species NOT DETECTED NOT DETECTED Final   Salmonella species NOT DETECTED NOT DETECTED Final   Serratia marcescens NOT DETECTED NOT DETECTED Final   Haemophilus influenzae NOT DETECTED NOT DETECTED Final   Neisseria meningitidis NOT DETECTED NOT DETECTED Final   Pseudomonas aeruginosa NOT DETECTED NOT DETECTED Final   Stenotrophomonas maltophilia NOT DETECTED NOT DETECTED Final   Candida albicans NOT DETECTED NOT DETECTED Final   Candida auris NOT DETECTED NOT DETECTED Final   Candida glabrata NOT DETECTED NOT DETECTED Final   Candida krusei NOT DETECTED NOT DETECTED Final   Candida parapsilosis NOT DETECTED NOT DETECTED Final   Candida tropicalis NOT DETECTED NOT DETECTED Final   Cryptococcus neoformans/gattii NOT DETECTED NOT DETECTED Final   Methicillin resistance mecA/C NOT DETECTED NOT DETECTED Final    Comment: Performed at [facility], [address], [place], [place] [address]  Resp panel by RT-PCR (RSV, Flu A&B, Covid) Anterior Nasal Swab     Status: Abnormal   Collection Time: 02/01/23 12:36 AM   Specimen: Anterior Nasal Swab  Result Value Ref Range Status   SARS Coronavirus 2 by RT PCR POSITIVE (A) NEGATIVE Final    Comment: (NOTE) SARS-CoV-2 target nucleic acids are DETECTED.  The SARS-CoV-2 RNA is generally detectable in upper respiratory specimens during the acute phase of infection. Positive results are indicative of the presence of the identified virus, but do not rule out bacterial infection or co-infection with other pathogens not detected by the test. Clinical correlation with patient history and other  diagnostic information is necessary to determine patient infection status. The expected result is Negative.  Fact Sheet for Patients: BloggerCourse.com  Fact Sheet for Healthcare Providers: SeriousBroker.it  This test is not yet approved or cleared by the [place] FDA and  has been authorized for detection and/or diagnosis of SARS-CoV-2 by FDA under an Emergency Use Authorization (EUA).  This EUA will remain in effect (meaning this test can be used) for the duration of  the COVID-19 declaration under Section 564(b)(1) of the A ct, 21 U.S.C. section 360bbb-3(b)(1), unless the authorization is terminated or revoked sooner.     Influenza A by PCR NEGATIVE NEGATIVE Final   Influenza B by PCR NEGATIVE NEGATIVE Final    Comment: (NOTE) The Xpert Xpress SARS-CoV-2/FLU/RSV plus assay is intended as an aid in the diagnosis of influenza from Nasopharyngeal swab specimens and should not be used as a sole basis for treatment. Nasal washings and aspirates are unacceptable for Xpert Xpress SARS-CoV-2/FLU/RSV testing.  Fact Sheet for Patients: BloggerCourse.com  Fact Sheet for Healthcare Providers: SeriousBroker.it  This test is not yet approved or cleared by the [place] FDA and has been authorized for detection and/or diagnosis of SARS-CoV-2 by FDA under an Emergency Use Authorization (EUA). This EUA will remain in effect (meaning this test can be used) for the duration of the COVID-19 declaration under Section 564(b)(1) of the Act, 21 U.S.C. section 360bbb-3(b)(1), unless the authorization is terminated or revoked.     Resp Syncytial Virus by PCR NEGATIVE NEGATIVE Final    Comment: (NOTE) Fact Sheet for Patients: BloggerCourse.com  Fact Sheet for Healthcare Providers: SeriousBroker.it  This test is not yet approved or  cleared by the [place] FDA and has been authorized for detection and/or diagnosis of SARS-CoV-2 by FDA under an Emergency Use Authorization (EUA). This EUA will remain in effect (meaning this test can be used) for the  duration of the COVID-19 declaration under Section 564(b)(1) of the Act, 21 U.S.C. section 360bbb-3(b)(1), unless the authorization is terminated or revoked.  Performed at [facility], [address], [place], [place] [address]      Medications:    aspirin  81 mg Oral Daily   clopidogrel  75 mg Oral Daily   famotidine  20 mg Oral BID   insulin aspart  0-6 Units Subcutaneous TID WC   lisinopril  40 mg Oral Daily   metFORMIN  1,000 mg Oral BID WC   rosuvastatin  10 mg Oral QPM   sodium chloride flush  3 mL Intravenous Q12H   tamsulosin  0.4 mg Oral QPC breakfast   Continuous Infusions:  sodium chloride     cefTRIAXone (ROCEPHIN)  IV 2 g (02/03/23 0537)      LOS: 3 days   Marinda Elk  [facility]  02/03/2023, 8:40 AM

## 2023-02-03 NOTE — [place] FL2 (Signed)
[place] MEDICAID FL2 LEVEL OF CARE FORM     IDENTIFICATION  Patient Name: Joel Stanley Birthdate: 01/08/1941 Sex: male Admission Date (Current Location): 01/31/2023  [facility] and [place] Number:  Producer, television/film/video and Address:  [facility],  [address] [place], [place] [address]      Provider Number: [id number]  Attending Physician Name and Address:  Marinda Elk, MD  Relative Name and Phone Number:  Umer Lubell [phone]    Current Level of Care: Hospital Recommended Level of Care: Skilled Nursing Facility Prior Approval Number:    Date Approved/Denied:   PASRR Number: [id number]  Discharge Plan: SNF    Current Diagnoses: Patient Active Problem List   Diagnosis Date Noted   CKD (chronic kidney disease), stage II 02/01/2023   Acute metabolic encephalopathy 01/31/2023   SIRS (systemic inflammatory response syndrome) (HCC) 01/31/2023   Hyperglycemia related-hyponatremia 01/31/2023   History of sinus bradycardia with sinus node dysfunction 07/29/2022   Right leg weakness 09/21/2020   Pneumonia due to COVID-19 virus 06/14/2020   Hyperlipidemia associated with type 2 diabetes mellitus (HCC) 06/14/2020   Generalized weakness 06/14/2020   CKD (chronic kidney disease), stage III (HCC) 12/13/2017   Chest pain 12/13/2017   Essential hypertension 03/02/2013   Hyperlipidemia 03/02/2013   Non-insulin dependent type 2 diabetes mellitus (HCC) 03/02/2013   OSA (obstructive sleep apnea) 03/02/2013   CAD (coronary artery disease) 04/12/2008   INCISIONAL HERNIA 04/12/2008   PERSONAL HISTORY OF COLONIC POLYPS 04/12/2008    Orientation RESPIRATION BLADDER Height & Weight     Self, Time, Situation, Place  Normal Continent, External catheter Weight: 196 lb 13.9 oz (89.3 kg) Height:     BEHAVIORAL SYMPTOMS/MOOD NEUROLOGICAL BOWEL NUTRITION STATUS      Continent Diet (heart healthy/carb modified)  AMBULATORY STATUS COMMUNICATION OF NEEDS Skin    Limited Assist Verbally Normal                       Personal Care Assistance Level of Assistance  Bathing, Feeding, Dressing Bathing Assistance: Independent Feeding assistance: Independent Dressing Assistance: Independent     Functional Limitations Info  Sight, Hearing, Speech Sight Info: Adequate Hearing Info: Adequate Speech Info: Adequate    SPECIAL CARE FACTORS FREQUENCY  PT (By licensed PT), OT (By licensed OT)     PT Frequency: 5x/wk OT Frequency: 5x/wk            Contractures Contractures Info: Not present    Additional Factors Info  Code Status, Allergies, Isolation Precautions Code Status Info: FULL Allergies Info: [place] Venom     Isolation Precautions Info: COVID-19. Tested positive on 02/01/23     Current Medications (02/03/2023):  This is the current hospital active medication list Current Facility-Administered Medications  Medication Dose Route Frequency Provider Last Rate Last Admin   0.9 %  sodium chloride infusion  250 mL Intravenous PRN Janalyn Shy, Subrina, MD       acetaminophen (TYLENOL) tablet 650 mg  650 mg Oral Q6H PRN Janalyn Shy, Subrina, MD   650 mg at 02/01/23 0408   Or   acetaminophen (TYLENOL) suppository 650 mg  650 mg Rectal Q6H PRN Janalyn Shy, Subrina, MD       aspirin chewable tablet 81 mg  81 mg Oral Daily Sundil, Subrina, MD   81 mg at 02/03/23 1010   clopidogrel (PLAVIX) tablet 75 mg  75 mg Oral Daily Sundil, Subrina, MD   75 mg at 02/02/23 0833   famotidine (PEPCID)  tablet 20 mg  20 mg Oral BID Janalyn Shy, Subrina, MD   20 mg at 02/03/23 1010   guaiFENesin (ROBITUSSIN) 100 MG/5ML liquid 5 mL  5 mL Oral Q4H PRN Marinda Elk, MD   5 mL at 02/02/23 1226   hydrALAZINE (APRESOLINE) injection 10 mg  10 mg Intravenous Q8H PRN Sundil, Subrina, MD       insulin aspart (novoLOG) injection 0-6 Units  0-6 Units Subcutaneous TID WC Sundil, Subrina, MD   2 Units at 02/03/23 1217   lisinopril (ZESTRIL) tablet 40 mg  40 mg Oral Daily Sundil,  Subrina, MD   40 mg at 02/03/23 1010   metFORMIN (GLUCOPHAGE) tablet 1,000 mg  1,000 mg Oral BID WC Marinda Elk, MD       nitroGLYCERIN (NITROSTAT) SL tablet 0.4 mg  0.4 mg Sublingual Q5 Min x 3 PRN Janalyn Shy, Subrina, MD       ondansetron [facility]) tablet 4 mg  4 mg Oral Q6H PRN Janalyn Shy, Subrina, MD       Or   ondansetron [facility]) injection 4 mg  4 mg Intravenous Q6H PRN Sundil, Subrina, MD       rosuvastatin (CRESTOR) tablet 10 mg  10 mg Oral QPM Sundil, Subrina, MD   10 mg at 02/02/23 1724   sodium chloride flush (NS) 0.9 % injection 3 mL  3 mL Intravenous Q12H Sundil, Subrina, MD   3 mL at 02/03/23 1010   sodium chloride flush (NS) 0.9 % injection 3 mL  3 mL Intravenous PRN Janalyn Shy, Subrina, MD       tamsulosin (FLOMAX) capsule 0.4 mg  0.4 mg Oral QPC breakfast Marinda Elk, MD   0.4 mg at 02/03/23 1010     Discharge Medications: Please see discharge summary for a list of discharge medications.  Relevant Imaging Results:  Relevant Lab Results:   Additional Information SSN: [id number]  Otelia Santee, LCSW

## 2023-02-03 NOTE — Progress Notes (Signed)
[facility] PROGRESS NOTE    Progress Note  Joel Stanley  WNU:[id number] DOB: 1941/02/24 DOA: 01/31/2023 PCP: [facility]     Brief Narrative:   Joel Stanley is an 82 y.o. 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  Patient relates has not been able to void for the last several months got worse over the last several 2 days on the day of admission he cannot urinate   Assessment/Plan:   SIRS (systemic inflammatory response syndrome) (HCC) Unclear source infectious workup has been negative he did have elevated lactic acid and a temp of 100.3. Was fluid resuscitated started empirically on antibiotics and urine cultures as well as blood cultures are pending. SARS-CoV-2 is positive, chest x-ray is clear he relates no shortness of breath. PT eval the patient recommended skilled nursing facility. He still debating whether to go home with home health therapy or  skilled nursing [facility]  Acute urinary retention: Probably contributing to SIRS urology was consulted Foley catheter was placed about a liter of urine came out. Urine has not cleared. Continue Flomax he will go home with a Foley will need to follow-up with urology as an outpatient.   Unfortunately urine cultures were not sent on admission, blood cultures are negative till date. Completed 3-day course of antibiotics  Traumatic hematuria: There were 5 attempts in placing a Foley, hematuria is not clearing hold Lovenox and Plavix.  Hemoglobin has remained stable. Once hematuria is clearing he will probably be discharged. He still debating whether to go home with physical therapy or skilled nursing facility.  Acute metabolic  encephalopathy: Of unclear source question multifactorial in the setting of acute urinary retention and infectious etiology.  Essential hypertension: Blood pressure elevated on admission agree with continuing lisinopril hydralazine IV as needed resume home meds.  History of CAD: Continue aspirin and Plavix and statins.  History of sick sinus syndrome sinus node dysfunction: Metoprolol has been discontinued as an outpatient. He remains mildly tachycardic continue to observe question of tachycardia due to stress reaction.  Non-insulin-dependent diabetes mellitus type 2: With last A1c of 6.6, resume metformin  Chronic kidney disease stage II: With baseline creatinine around 1.0. Catheter placed by urology good urine output.  DVT prophylaxis: lovenox Family Communication:none Status is: Inpatient Remains inpatient appropriate because: SIRS    Code Status:     Code Status Orders  (From admission, onward)           Start     Ordered   01/31/23 2354  Full code  Continuous       Question:  By:  Answer:  Consent: discussion documented in EHR   01/31/23 2355           Code Status History     Date Active Date Inactive Code Status Order ID Comments User Context   06/14/2020 2220 06/17/2020 1920 Full Code [id number]  Charlsie Quest, MD ED   12/13/2017 2255 12/14/2017 2113 Full Code [id number]  Opyd, Lavone Neri, MD ED         IV Access:   Peripheral IV   Procedures and diagnostic studies:   No results found.   Medical Consultants:   None.   Subjective:    Joel Stanley no new plaints this morning.  Objective:    Vitals:   02/02/23 2200 02/03/23 0500 02/03/23 0539 02/03/23 0918  BP: (!) 140/94  131/83   Pulse: (!) 107  90 (!) 101  Resp: 16     Temp: 98.2 F (36.8 C)  98.3 F (36.8 C)   TempSrc: Oral  Oral   SpO2: 92%  95% 97%  Weight:  89.3 kg     SpO2: 97 %   Intake/Output Summary (Last 24 hours) at 02/03/2023 1051 Last data filed at  02/03/2023 0537 Gross per 24 hour  Intake 360 ml  Output 1900 ml  Net -1540 ml   Filed Weights   02/02/23 0500 02/03/23 0500  Weight: 87.7 kg 89.3 kg    Exam: General exam: In no acute distress. Respiratory system: Good air movement and clear to auscultation. Cardiovascular system: S1 & S2 heard, RRR. No JVD. Gastrointestinal system: Abdomen is nondistended, soft and nontender.  Extremities: No pedal edema. Skin: No rashes, lesions or ulcers Psychiatry: Judgement and insight appear normal. Mood & affect appropriate.  Data Reviewed:    Labs: Basic Metabolic Panel: Recent Labs  Lab 01/31/23 2023 02/01/23 0500 02/02/23 0532 02/03/23 0536  NA 133* 134* 134* 135  K 4.0 3.6 3.9 3.9  CL 100 100 99 99  CO2 22 21* 24 24  GLUCOSE 191* 260* 190* 199*  BUN 15 16 20 22   CREATININE 1.08 0.98 1.15 1.16  CALCIUM 8.8* 8.9 9.1 8.9   GFR Estimated Creatinine Clearance: 56.2 mL/min (by C-G formula based on SCr of 1.16 mg/dL). Liver Function Tests: Recent Labs  Lab 01/31/23 2023 02/01/23 0500 02/02/23 0532 02/03/23 0536  AST 18 16 15 16   ALT 13 15 14 20   ALKPHOS 50 46 41 43  BILITOT 1.2 1.1 0.6 0.6  PROT 7.5 7.2 6.4* 6.6  ALBUMIN 4.1 3.8 3.3* 3.3*   No results for input(s): "LIPASE", "AMYLASE" in the last 168 hours. No results for input(s): "AMMONIA" in the last 168 hours. Coagulation profile Recent Labs  Lab 01/31/23 2023 02/01/23 1141  INR 1.1 1.1   COVID-19 Labs  No results for input(s): "DDIMER", "FERRITIN", "LDH", "CRP" in the last 72 hours.  Lab Results  Component Value Date   SARSCOV2NAA POSITIVE (A) 02/01/2023   SARSCOV2NAA NEGATIVE 05/20/2022   SARSCOV2NAA POSITIVE (A) 06/14/2020    CBC: Recent Labs  Lab 01/31/23 2023 02/01/23 0500 02/02/23 0532 02/03/23 0536  WBC 6.7 8.0 5.6 4.8  NEUTROABS 5.2  --   --   --   HGB 14.5 14.4 14.1 14.0  HCT 42.8 43.0 43.2 41.6  MCV 89.0 89.4 92.5 91.2  PLT 251 230 216 226   Cardiac Enzymes: No results for  input(s): "CKTOTAL", "CKMB", "CKMBINDEX", "TROPONINI" in the last 168 hours. BNP (last 3 results) No results for input(s): "PROBNP" in the last 8760 hours. CBG: Recent Labs  Lab 02/02/23 1154 02/02/23 1620 02/02/23 2157 02/03/23 0047 02/03/23 0755  GLUCAP 251* 196* 229* 184* 213*   D-Dimer: No results for input(s): "DDIMER" in the last 72 hours. Hgb A1c: Recent Labs    02/01/23 0500  HGBA1C 7.1*   Lipid Profile: No results for input(s): "CHOL", "HDL", "LDLCALC", "TRIG", "CHOLHDL", "LDLDIRECT" in the last 72 hours. Thyroid function studies: No results for input(s): "TSH", "T4TOTAL", "T3FREE", "THYROIDAB" in the last 72 hours.  Invalid input(s): "FREET3" Anemia work up: No results for input(s): "VITAMINB12", "FOLATE", "FERRITIN", "TIBC", "IRON", "RETICCTPCT" in the last 72 hours. Sepsis Labs: Recent Labs  Lab 01/31/23 2023 01/31/23 2204 01/31/23  2318 01/31/23 2351 02/01/23 0500 02/02/23 0532 02/03/23 0536  WBC 6.7  --   --   --  8.0 5.6 4.8  LATICACIDVEN  --  2.4* 2.3* 1.6  --   --   --    Microbiology Recent Results (from the past 240 hour(s))  Blood Culture (routine x 2)     Status: None (Preliminary result)   Collection Time: 01/31/23  8:27 PM   Specimen: BLOOD RIGHT ARM  Result Value Ref Range Status   Specimen Description   Final    BLOOD RIGHT ARM Performed at [facility], [address] [place], [place] [address]    Special Requests   Final    BOTTLES DRAWN AEROBIC AND ANAEROBIC Blood Culture adequate volume   Culture   Final    NO GROWTH 2 DAYS Performed at [facility], [address] [place], [place] [address]    Report Status PENDING  Incomplete  Blood Culture (routine x 2)     Status: Abnormal   Collection Time: 01/31/23  8:28 PM   Specimen: BLOOD LEFT ARM  Result Value Ref Range Status   Specimen Description   Final    BLOOD LEFT ARM Performed at [facility], [address] [place],  [place] [address]    Special Requests   Final    BOTTLES DRAWN AEROBIC AND ANAEROBIC Blood Culture adequate volume Performed at [facility], [address], [place], [place] [address]    Culture  Setup Time   Final    GRAM POSITIVE COCCI IN CLUSTERS AEROBIC BOTTLE ONLY CRITICAL RESULT CALLED TO, READ BACK BY AND VERIFIED WITH: PHARMD A. ELLINGTON 02/02/23 @ 0119 BY AB    Culture (A)  Final    STAPHYLOCOCCUS HOMINIS THE SIGNIFICANCE OF ISOLATING THIS ORGANISM FROM A SINGLE SET OF BLOOD CULTURES WHEN MULTIPLE SETS ARE DRAWN IS UNCERTAIN. PLEASE NOTIFY THE MICROBIOLOGY DEPARTMENT WITHIN ONE WEEK IF SPECIATION AND SENSITIVITIES ARE REQUIRED. UNABLE TO ISOLATE STAPHYLOCOCCUS EPIDERMIDIS FROM CULTURE Performed at [facility], [address], [place], [place] [address]    Report Status 02/02/2023 FINAL  Final  Blood Culture ID Panel (Reflexed)     Status: Abnormal   Collection Time: 01/31/23  8:28 PM  Result Value Ref Range Status   Enterococcus faecalis NOT DETECTED NOT DETECTED Final   Enterococcus Faecium NOT DETECTED NOT DETECTED Final   Listeria monocytogenes NOT DETECTED NOT DETECTED Final   Staphylococcus species DETECTED (A) NOT DETECTED Final    Comment: CRITICAL RESULT CALLED TO, READ BACK BY AND VERIFIED WITH: PHARMD A. ELLINGTON 02/02/23 @ 0119 BY AB    Staphylococcus aureus (BCID) NOT DETECTED NOT DETECTED Final   Staphylococcus epidermidis DETECTED (A) NOT DETECTED Final    Comment: CRITICAL RESULT CALLED TO, READ BACK BY AND VERIFIED WITH: PHARMD A. ELLINGTON 02/02/23 @ 0119 BY AB    Staphylococcus lugdunensis NOT DETECTED NOT DETECTED Final   Streptococcus species NOT DETECTED NOT DETECTED Final   Streptococcus agalactiae NOT DETECTED NOT DETECTED Final   Streptococcus pneumoniae NOT DETECTED NOT DETECTED Final   Streptococcus pyogenes NOT DETECTED NOT DETECTED Final   A.calcoaceticus-baumannii NOT DETECTED NOT DETECTED Final   Bacteroides fragilis NOT  DETECTED NOT DETECTED Final   Enterobacterales NOT DETECTED NOT DETECTED Final   Enterobacter cloacae complex NOT DETECTED NOT DETECTED Final   Escherichia coli NOT DETECTED NOT DETECTED Final   Klebsiella aerogenes NOT DETECTED NOT DETECTED Final   Klebsiella  oxytoca NOT DETECTED NOT DETECTED Final   Klebsiella pneumoniae NOT DETECTED NOT DETECTED Final   Proteus species NOT DETECTED NOT DETECTED Final   Salmonella species NOT DETECTED NOT DETECTED Final   Serratia marcescens NOT DETECTED NOT DETECTED Final   Haemophilus influenzae NOT DETECTED NOT DETECTED Final   Neisseria meningitidis NOT DETECTED NOT DETECTED Final   Pseudomonas aeruginosa NOT DETECTED NOT DETECTED Final   Stenotrophomonas maltophilia NOT DETECTED NOT DETECTED Final   Candida albicans NOT DETECTED NOT DETECTED Final   Candida auris NOT DETECTED NOT DETECTED Final   Candida glabrata NOT DETECTED NOT DETECTED Final   Candida krusei NOT DETECTED NOT DETECTED Final   Candida parapsilosis NOT DETECTED NOT DETECTED Final   Candida tropicalis NOT DETECTED NOT DETECTED Final   Cryptococcus neoformans/gattii NOT DETECTED NOT DETECTED Final   Methicillin resistance mecA/C NOT DETECTED NOT DETECTED Final    Comment: Performed at [facility], [address], [place], [place] [address]  Resp panel by RT-PCR (RSV, Flu A&B, Covid) Anterior Nasal Swab     Status: Abnormal   Collection Time: 02/01/23 12:36 AM   Specimen: Anterior Nasal Swab  Result Value Ref Range Status   SARS Coronavirus 2 by RT PCR POSITIVE (A) NEGATIVE Final    Comment: (NOTE) SARS-CoV-2 target nucleic acids are DETECTED.  The SARS-CoV-2 RNA is generally detectable in upper respiratory specimens during the acute phase of infection. Positive results are indicative of the presence of the identified virus, but do not rule out bacterial infection or co-infection with other pathogens not detected by the test. Clinical correlation with patient history  and other diagnostic information is necessary to determine patient infection status. The expected result is Negative.  Fact Sheet for Patients: BloggerCourse.com  Fact Sheet for Healthcare Providers: SeriousBroker.it  This test is not yet approved or cleared by the [place] FDA and  has been authorized for detection and/or diagnosis of SARS-CoV-2 by FDA under an Emergency Use Authorization (EUA).  This EUA will remain in effect (meaning this test can be used) for the duration of  the COVID-19 declaration under Section 564(b)(1) of the A ct, 21 U.S.C. section 360bbb-3(b)(1), unless the authorization is terminated or revoked sooner.     Influenza A by PCR NEGATIVE NEGATIVE Final   Influenza B by PCR NEGATIVE NEGATIVE Final    Comment: (NOTE) The Xpert Xpress SARS-CoV-2/FLU/RSV plus assay is intended as an aid in the diagnosis of influenza from Nasopharyngeal swab specimens and should not be used as a sole basis for treatment. Nasal washings and aspirates are unacceptable for Xpert Xpress SARS-CoV-2/FLU/RSV testing.  Fact Sheet for Patients: BloggerCourse.com  Fact Sheet for Healthcare Providers: SeriousBroker.it  This test is not yet approved or cleared by the [place] FDA and has been authorized for detection and/or diagnosis of SARS-CoV-2 by FDA under an Emergency Use Authorization (EUA). This EUA will remain in effect (meaning this test can be used) for the duration of the COVID-19 declaration under Section 564(b)(1) of the Act, 21 U.S.C. section 360bbb-3(b)(1), unless the authorization is terminated or revoked.     Resp Syncytial Virus by PCR NEGATIVE NEGATIVE Final    Comment: (NOTE) Fact Sheet for Patients: BloggerCourse.com  Fact Sheet for Healthcare Providers: SeriousBroker.it  This test is not yet  approved or cleared by the [place] FDA and has been authorized for detection and/or diagnosis of SARS-CoV-2 by FDA under an Emergency Use Authorization (EUA). This EUA will remain in effect (meaning this test  can be used) for the duration of the COVID-19 declaration under Section 564(b)(1) of the Act, 21 U.S.C. section 360bbb-3(b)(1), unless the authorization is terminated or revoked.  Performed at [facility], [address], [place], [place] [address]      Medications:    aspirin  81 mg Oral Daily   clopidogrel  75 mg Oral Daily   famotidine  20 mg Oral BID   insulin aspart  0-6 Units Subcutaneous TID WC   lisinopril  40 mg Oral Daily   metFORMIN  1,000 mg Oral BID WC   rosuvastatin  10 mg Oral QPM   sodium chloride flush  3 mL Intravenous Q12H   tamsulosin  0.4 mg Oral QPC breakfast   Continuous Infusions:  sodium chloride     cefTRIAXone (ROCEPHIN)  IV 2 g (02/03/23 0537)      LOS: 3 days   Marinda Elk  [facility]  02/03/2023, 10:51 AM

## 2023-02-03 NOTE — Inpatient Diabetes Management (Signed)
Inpatient Diabetes Program Recommendations  AACE/ADA: New Consensus Statement on Inpatient Glycemic Control   Target Ranges:  Prepandial:   less than 140 mg/dL      Peak postprandial:   less than 180 mg/dL (1-2 hours)      Critically ill patients:  140 - 180 mg/dL    Latest Reference Range & Units 02/03/23 00:47 02/03/23 07:55  Glucose-Capillary 70 - 99 mg/dL 151 (H) 761 (H)    Latest Reference Range & Units 02/02/23 08:18 02/02/23 11:54 02/02/23 16:20 02/02/23 21:57  Glucose-Capillary 70 - 99 mg/dL 607 (H) 371 (H) 062 (H) 229 (H)   Review of Glycemic Control  Diabetes history: DM2 Outpatient Diabetes medications: Metformin 1000 mg BID Current orders for Inpatient glycemic control: Novolog 0-6 units TID with meals  Inpatient Diabetes Program Recommendations:    Insulin: Please consider ordering Novolog 2 units TID with meals for meal coverage if patient eats at least 50% of meals.  Thanks, Orlando Penner, RN, MSN, CDCES Diabetes Coordinator Inpatient Diabetes Program [phone] (Team Pager from 8am to 5pm)

## 2023-02-03 NOTE — Plan of Care (Signed)

## 2023-02-03 NOTE — Evaluation (Signed)
Occupational Therapy Evaluation Patient Details Name: Joel Stanley MRN: [id number] DOB: [birth year] Today's Date: 02/03/2023   History of Present Illness 82 yo male admitted with SIRS, acute met encephalopathy, urinary retention, COVID. Hx of COVID, CAD, DM, CKD, MI, hernia repair, bil TKAs, OSA, sinus bradycardia, angina   Clinical Impression   This 82 yo male admitted with above presents to acute OT with PLOF of being mainly independent to Mod I with all basic ADLs, some IADLs, and driving. He currently is at a contact guard A when up on his feet without AD for transfers and setup/S-contact guard A for ADLS. He will continue to benefit from acute OT without need for follow up but may need prn A from girlfriend.        If plan is discharge home, recommend the following: A little help with walking and/or transfers;A little help with bathing/dressing/bathroom;Assistance with cooking/housework;Assist for transportation    Functional Status Assessment  Patient has had a recent decline in their functional status and demonstrates the ability to make significant improvements in function in a reasonable and predictable amount of time.  Equipment Recommendations  None recommended by OT       Precautions / Restrictions Precautions Precautions: Fall Restrictions Weight Bearing Restrictions: No      Mobility Bed Mobility Overal bed mobility: Modified Independent (HOB Up)                  Transfers Overall transfer level: Needs assistance Equipment used: None Transfers: Sit to/from Stand, Bed to chair/wheelchair/BSC Sit to Stand: Contact guard assist Stand pivot transfers: Contact guard assist                Balance Overall balance assessment: Mild deficits observed, not formally tested                                         ADL either performed or assessed with clinical judgement   ADL Overall ADL's : Needs assistance/impaired Eating/Feeding:  Independent;Sitting   Grooming: Contact guard assist;Standing   Upper Body Bathing: Set up;Sitting   Lower Body Bathing: Contact guard assist;Sit to/from stand   Upper Body Dressing : Set up;Sitting   Lower Body Dressing: Contact guard assist;Sit to/from stand   Toilet Transfer: Contact guard assist;Stand-pivot Toilet Transfer Details (indicate cue type and reason): simulated bed to recliner going to his left Toileting- Clothing Manipulation and Hygiene: Contact guard assist;Sit to/from stand               Vision Patient Visual Report: No change from baseline              Pertinent Vitals/Pain Pain Assessment Pain Assessment:  ("nothing other than the normal")     Extremity/Trunk Assessment Upper Extremity Assessment Upper Extremity Assessment: Overall WFL for tasks assessed           Communication Communication Communication: No apparent difficulties   Cognition Arousal: Alert Behavior During Therapy: WFL for tasks assessed/performed Overall Cognitive Status: Within Functional Limits for tasks assessed                                                  Home Living Family/patient expects to be discharged to:: Private residence Living Arrangements: Spouse/significant other (lives with  girlfriend) Available Help at Discharge: Family;Available 24 hours/day Type of Home: House Home Access: Stairs to enter     Home Layout: One level     Bathroom Shower/Tub: Walk-in shower;Door   Bathroom Toilet: Handicapped height     Home Equipment: Agricultural consultant (2 wheels);Cane - single point;Shower seat - built in;Grab bars - tub/shower;Hand held shower head   Additional Comments: States he has been 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      Prior Functioning/Environment Prior Level of Function : Independent/Modified Independent;Driving             Mobility Comments: no device inside home. walker outside of home ADLs Comments: girlfriend assisted wth bathing, dressing prn        OT Problem List: Impaired balance (sitting and/or standing)      OT Treatment/Interventions: Self-care/ADL training;DME and/or AE instruction;Balance training;Patient/family education    OT Goals(Current goals can be found in the care plan section) Acute Rehab OT Goals Patient Stated Goal: to go home, finish overseeing his house remodel and attend his sister's 50th wedding anniversarycelebration at the end of the month OT Goal Formulation: With patient Time For Goal Achievement: 02/17/23 Potential to Achieve Goals: Good  OT Frequency: Min 1X/week       AM-PAC OT "6 Clicks" Daily Activity     Outcome Measure Help from another person eating meals?: None Help from another person taking care of personal grooming?: A Little Help from another person toileting, which includes using toliet, bedpan, or urinal?: A Little Help from another person bathing (including washing, rinsing, drying)?: A Little Help from another person to put on and taking off regular upper body clothing?: A Little Help from another person to put on and taking off regular lower body clothing?: A Little 6 Click Score: 19   End of Session    Activity Tolerance: Patient tolerated treatment well Patient left: in chair;with call bell/phone within reach;with chair alarm set  OT Visit Diagnosis: Unsteadiness on feet (R26.81);Other abnormalities of gait and mobility (R26.89);Muscle weakness (generalized) (M62.81)                Time: 8295-6213 OT Time Calculation (min): 35 min Charges:  OT General Charges $OT Visit: 1 Visit OT Evaluation $OT Eval Moderate Complexity: 1 Mod OT Treatments $Self Care/Home Management : 8-22  mins  Lindon Romp OT Acute Rehabilitation Services Office [phone]    Evette Georges 02/03/2023, 9:27 AM

## 2023-02-03 NOTE — Progress Notes (Signed)
Physical Therapy Treatment Patient Details Name: Joel Stanley MRN: [id number] DOB: 1940-08-27 Today's Date: 02/03/2023   History of Present Illness 82 yo male admitted with SIRS, acute met encephalopathy, urinary retention, COVID. Hx of COVID, CAD, DM, CKD, MI, hernia repair, bil TKAs, OSA, sinus bradycardia, angina    PT Comments  Pt agreeable to working with therapy. Improved mobility on today compared to last PT session. Issue today was R foot pain-pt rated 8/10. He still mobilized well despite pain. Discussed d/c plan-feel pt would be able to care for himself at home. Will update PT recs to HHPT f/u. Will continue to follow pt during this hospital stay.     If plan is discharge home, recommend the following:     Can travel by private vehicle        Equipment Recommendations  None recommended by PT    Recommendations for Other Services       Precautions / Restrictions Precautions Precautions: Fall Restrictions Weight Bearing Restrictions: No     Mobility  Bed Mobility               General bed mobility comments: oob in recliner.    Transfers Overall transfer level: Needs assistance Equipment used: Rolling walker (2 wheels) Transfers: Sit to/from Stand Sit to Stand: Contact guard assist           General transfer comment: No assist required. Increased time.    Ambulation/Gait Ambulation/Gait assistance: Contact guard assist Gait Distance (Feet): 115 Feet Assistive device: Rolling walker (2 wheels) Gait Pattern/deviations: Step-through pattern, Decreased stride length, Antalgic       General Gait Details: Gait mildly antalgic 2* R foot. Cues for posture, RW proximity-pt felt he could walk without device but agreed to use walker due to R foot pain. Tolerated distance well.   Stairs             Wheelchair Mobility     Tilt Bed    Modified Rankin (Stroke Patients Only)       Balance Overall balance assessment: Mild deficits  observed, not formally tested                                          Cognition Arousal: Alert Behavior During Therapy: WFL for tasks assessed/performed Overall Cognitive Status: Within Functional Limits for tasks assessed                                          Exercises      General Comments        Pertinent Vitals/Pain Pain Assessment Pain Assessment: 0-10 Pain Score: 8  Pain Location: R foot Pain Descriptors / Indicators: Aching, Tender Pain Intervention(s): Limited activity within patient's tolerance, Monitored during session, Repositioned    Home Living Family/patient expects to be discharged to:: Private residence Living Arrangements: Spouse/significant other (lives with girlfriend) Available Help at Discharge: Family;Available 24 hours/day Type of Home: House Home Access: Stairs to enter       Home Layout: One level Home Equipment: Agricultural consultant (2 wheels);Cane - single point;Shower seat - built in;Grab bars - tub/shower;Hand held shower head Additional Comments: States he has been 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    Prior Function            PT Goals (current goals can now be found in the care plan section) Progress towards PT goals: Progressing toward goals    Frequency    Min 1X/week      PT Plan      Co-evaluation              AM-PAC PT "6 Clicks" Mobility   Outcome Measure  Help needed turning from your back to your side while in a flat bed without using bedrails?: A Little Help needed moving from lying on your back to sitting on the side of a flat bed without using bedrails?: A Little Help needed moving to and from a bed to a chair  (including a wheelchair)?: A Little Help needed standing up from a chair using your arms (e.g., wheelchair or bedside chair)?: A Little Help needed to walk in hospital room?: A Little Help needed climbing 3-5 steps with a railing? : A Little 6 Click Score: 18    End of Session Equipment Utilized During Treatment: Gait belt Activity Tolerance: Patient tolerated treatment well;Patient limited by pain Patient left: in chair;with call bell/phone within reach;with chair alarm set   PT Visit Diagnosis: Pain;Difficulty in walking, not elsewhere classified (R26.2) Pain - Right/Left: Right Pain - part of body: Ankle and joints of foot     Time: 2952-8413 PT Time Calculation (min) (ACUTE ONLY): 18 min  Charges:    $Gait Training: 8-22 mins PT General Charges $$ ACUTE PT VISIT: 1 Visit                         Faye Ramsay, PT Acute Rehabilitation  Office: [phone]

## 2023-02-03 NOTE — Plan of Care (Signed)

## 2023-02-03 NOTE — TOC Progression Note (Signed)
Transition of Care [facility]) - Progression Note    Patient Details  Name: Joel Stanley MRN: [id number] Date of Birth: 1941/01/06  [facility]) CM/SW Contact  Otelia Santee, LCSW Phone Number: 02/03/2023, 3:25 PM  Clinical Narrative:    Met with pt and s/o in room to discuss disposition. CSW reviewed recommendation for [facility]: [facility] Rhonda W based on pt's progress with PT.  Pt's s/o shares that she cannot manage his needs at home in regards to his catheter and concern for risk of falls. CSW shared that these do not qualify pt's for SNF. Pt's s/o has been in contact with [facility] who is able to extend a bed offer for SNF. CSW agreed to contact [facility] and discussed potential barrier to SNF will be insurance approval.  Confirmed bed offer w/ Katie at [facility]. She shares that they have other pt's with COVID at their facility and would be able to accept him prior to end of COVID isolation.  [facility]ic[facility] for [facility] case worker. Will request insurance authorization once able to speak to case worker.    Expected Discharge Plan: Home w Home Health Services Barriers to Discharge: No Barriers Identified  Expected Discharge Plan and Services In-house Referral: NA Discharge Planning Services: NA Post Acute Care Choice: Home Health Living arrangements for the past 2 months: Single Family Home                 DME Arranged: N/A DME Agency: NA       HH Arranged: PT HH Agency: [facility] Date HH Agency Contacted: 02/02/23 Time HH Agency Contacted: 1417 Representative spoke with at [facility] Agency: Angie   Social Determinants of Health (SDOH) Interventions SDOH Screenings   Food Insecurity: No Food Insecurity (02/01/2023)  Housing: Patient Unable To Answer (02/01/2023)  Transportation Needs: Patient Declined (02/01/2023)  Utilities: Patient Declined (02/01/2023)  Social Connections: Unknown (09/08/2022)   Received from [facility]  Tobacco Use: Medium Risk  (01/31/2023)    Readmission Risk Interventions    02/03/2023    3:24 PM 02/02/2023    2:16 PM  Readmission Risk Prevention Plan  Post Dischage Appt Complete Complete  Medication Screening Complete Complete  Transportation Screening Complete Complete

## 2023-02-04 ENCOUNTER — Inpatient Hospital Stay (HOSPITAL_COMMUNITY): Payer: No Typology Code available for payment source

## 2023-02-04 DIAGNOSIS — R6510 Systemic inflammatory response syndrome (SIRS) of non-infectious origin without acute organ dysfunction: Secondary | ICD-10-CM | POA: Diagnosis not present

## 2023-02-04 LAB — GLUCOSE, CAPILLARY
Glucose-Capillary: 208 mg/dL — ABNORMAL HIGH (ref 70–99)
Glucose-Capillary: 200 mg/dL — ABNORMAL HIGH (ref 70–99)
Glucose-Capillary: 191 mg/dL — ABNORMAL HIGH (ref 70–99)
Glucose-Capillary: 177 mg/dL — ABNORMAL HIGH (ref 70–99)

## 2023-02-04 MED ORDER — POLYVINYL ALCOHOL 1.4 % OP SOLN
1.0000 [drp] | OPHTHALMIC | Status: DC | PRN
  Administered 2023-02-04: 1 [drp] via OPHTHALMIC
  Filled 2023-02-04: qty 15

## 2023-02-04 MED ORDER — COLCHICINE 0.6 MG PO TABS
0.6000 mg | ORAL_TABLET | Freq: Every day | ORAL | Status: DC
  Administered 2023-02-05 – 2023-02-06 (×2): 0.6 mg via ORAL
  Filled 2023-02-04 (×2): qty 1

## 2023-02-04 MED ORDER — COLCHICINE 0.6 MG PO TABS
1.2000 mg | ORAL_TABLET | Freq: Once | ORAL | Status: AC
  Administered 2023-02-04: 1.2 mg via ORAL
  Filled 2023-02-04: qty 2

## 2023-02-04 MED ORDER — CHLORHEXIDINE GLUCONATE CLOTH 2 % EX PADS
6.0000 | MEDICATED_PAD | Freq: Every day | CUTANEOUS | Status: DC
  Administered 2023-02-05 – 2023-02-06 (×2): 6 via TOPICAL

## 2023-02-04 NOTE — Progress Notes (Signed)
Mobility Specialist - Progress Note   02/04/23 1007  Mobility  Activity Ambulated with assistance in hallway  Level of Assistance Modified independent, requires aide device or extra time  Assistive Device Front wheel walker  Distance Ambulated (ft) 300 ft  Range of Motion/Exercises Active  Activity Response Tolerated well  Mobility Referral Yes  $Mobility charge 1 Mobility  Mobility Specialist Start Time (ACUTE ONLY) 0955  Mobility Specialist Stop Time (ACUTE ONLY) 1006  Mobility Specialist Time Calculation (min) (ACUTE ONLY) 11 min   Pt received in chair and agreed to mobility, had no issues throughout session, returned to chair with all needs met.  Marilynne Halsted Mobility Specialist

## 2023-02-04 NOTE — Hospital Course (Addendum)
Joel Stanley is a 82 y.o. male with a history of sinus node dysfunction, sinus bradycardia, hypertension, diabetes mellitus type 2, CAD.  Patient presented secondary to generalized weakness with elevated blood pressure and hyperglycemia, found to have evidence of infection with concern for sepsis.  Workup significant for COVID-19 infection.  During hospitalization, patient was noted to have developed acute urinary retention requiring placement of a Foley catheter; this was complicated by traumatic hematuria requiring urology consult for placement of catheter.  Recommendation from physical therapy for discharge to SNF, however insurance denied SNF discharge. Patient plan for discharge home with home health.

## 2023-02-04 NOTE — Inpatient Diabetes Management (Signed)
Inpatient Diabetes Program Recommendations  AACE/ADA: New Consensus Statement on Inpatient Glycemic Control   Target Ranges:  Prepandial:   less than 140 mg/dL      Peak postprandial:   less than 180 mg/dL (1-2 hours)      Critically ill patients:  140 - 180 mg/dL    Latest Reference Range & Units 02/03/23 07:55 02/03/23 11:54 02/03/23 16:39 02/03/23 22:05 02/04/23 07:13 02/04/23 11:21  Glucose-Capillary 70 - 99 mg/dL 161 (H) 096 (H) 045 (H) 213 (H) 208 (H) 200 (H)   Review of Glycemic Control  Diabetes history: DM2 Outpatient Diabetes medications: Metformin 1000 mg BID Current orders for Inpatient glycemic control: Novolog 0-6 units TID with meals, Metformin 1000 mg BID   Inpatient Diabetes Program Recommendations:     Insulin: If postprandial glucose remains consistently over 180 mg/dl, please consider ordering Novolog 2 units TID with meals for meal coverage if patient eats at least 50% of meals.   Thanks, Orlando Penner, RN, MSN, CDCES Diabetes Coordinator Inpatient Diabetes Program [phone] (Team Pager from 8am to 5pm)

## 2023-02-04 NOTE — Plan of Care (Signed)

## 2023-02-04 NOTE — TOC Progression Note (Signed)
Transition of Care [facility]) - Progression Note    Patient Details  Name: Joel Stanley MRN: [id number] Date of Birth: [birth year]  Transition of Care [facility]) CM/SW Contact  Otelia Santee, LCSW Phone Number: 02/04/2023, 12:52 PM  Clinical Narrative:    Insurance Berkley Harvey has been requested for SNF placement at [facility]. CSW awaiting insurance determination.    Expected Discharge Plan: Home w Home Health Services Barriers to Discharge: No Barriers Identified  Expected Discharge Plan and Services In-house Referral: NA Discharge Planning Services: NA Post Acute Care Choice: Home Health Living arrangements for the past 2 months: Single Family Home                 DME Arranged: N/A DME Agency: NA       HH Arranged: PT HH Agency: [facility] Date HH Agency Contacted: 02/02/23 Time HH Agency Contacted: 1417 Representative spoke with at [facility] Agency: Angie   Social Determinants of Health (SDOH) Interventions SDOH Screenings   Food Insecurity: No Food Insecurity (02/01/2023)  Housing: Patient Unable To Answer (02/01/2023)  Transportation Needs: Patient Declined (02/01/2023)  Utilities: Patient Declined (02/01/2023)  Social Connections: Unknown (09/08/2022)   Received from [facility]  Tobacco Use: Medium Risk (01/31/2023)    Readmission Risk Interventions    02/04/2023   12:52 PM 02/03/2023    3:24 PM 02/02/2023    2:16 PM  Readmission Risk Prevention Plan  Post Dischage Appt Complete Complete Complete  Medication Screening Complete Complete Complete  Transportation Screening Complete Complete Complete

## 2023-02-04 NOTE — Progress Notes (Signed)
Occupational Therapy Treatment Patient Details Name: Joel Stanley MRN: [id number] DOB: August 27, 1940 Today's Date: 02/04/2023   History of present illness 82 yo male admitted with SIRS, acute met encephalopathy, urinary retention, COVID. Hx of COVID, CAD, DM, CKD, MI, hernia repair, bil TKAs, OSA, sinus bradycardia, angina   OT comments  This 82 yo male seen today and not doing as well due to right great toe pain, top of right foot pain and back pain due to having to ambulate awkwardly due to right foot. His right great toe and place where he says the pain is on the top of his foot are red, this foot is warmer than his left foot, and the area is more swollen compared to left foot--made MD aware via telephone. He will continue to benefit from acute OT with follow up now changed to continued inpatient follow up therapy, <3 hours/day.       If plan is discharge home, recommend the following:  A little help with walking and/or transfers;A little help with bathing/dressing/bathroom;Assistance with cooking/housework;Assist for transportation;Help with stairs or ramp for entrance   Equipment Recommendations  Other (comment) (TBD next venue of care)       Precautions / Restrictions Precautions Precautions: Fall Restrictions Weight Bearing Restrictions: No       Mobility Bed Mobility               General bed mobility comments: oob in recliner.    Transfers Overall transfer level: Needs assistance Equipment used: Rolling walker (2 wheels) Transfers: Sit to/from Stand Sit to Stand: Contact guard assist           General transfer comment: No assist required. Increased time and hunched over position due to right toe/foot pain and back pain     Balance Overall balance assessment: Needs assistance Sitting-balance support: No upper extremity supported, Feet supported Sitting balance-Leahy Scale: Good     Standing balance support: No upper extremity supported, During  functional activity Standing balance-Leahy Scale: Fair Standing balance comment: standing at sink to brush teeth but in a hunched over posture due to toe/foot pain and then back pain due to this posture                           ADL either performed or assessed with clinical judgement   ADL Overall ADL's : Needs assistance/impaired     Grooming: Contact guard assist;Standing;Oral care                 Lower Body Dressing Details (indicate cue type and reason): really struggled with getting socks off for me to look at his feet Toilet Transfer: Minimal assistance;Ambulation;Rolling walker (2 wheels);Regular Toilet;Grab bars                  Extremity/Trunk Assessment Upper Extremity Assessment Upper Extremity Assessment: Overall WFL for tasks assessed            Vision Patient Visual Report: No change from baseline            Cognition Arousal: Alert Behavior During Therapy: WFL for tasks assessed/performed Overall Cognitive Status: Within Functional Limits for tasks assessed                                                     Pertinent  Vitals/ Pain       Pain Assessment Pain Score: 6  Pain Location: R great toe large joint and boney protuberance on top of right foot AND back due to having to ambulate and stand awkwardly due to toe/foot pain Pain Descriptors / Indicators: Aching, Tender (with weightbearing) Pain Intervention(s): Limited activity within patient's tolerance, Monitored during session, Repositioned (made MD aware of this pain and how it affecting pt's mobility)         Frequency  Min 1X/week        Progress Toward Goals  OT Goals(current goals can now be found in the care plan section)  Progress towards OT goals: Not progressing toward goals - comment (due to right toe/right top of foot/back pain)  Acute Rehab OT Goals Patient Stated Goal: now in need of SNF due to urinary retention and pain in right  foot causing him to be less balanced and increased effort for all activities OT Goal Formulation: With patient Time For Goal Achievement: 02/17/23 Potential to Achieve Goals: Good         AM-PAC OT "6 Clicks" Daily Activity     Outcome Measure   Help from another person eating meals?: None Help from another person taking care of personal grooming?: A Little Help from another person toileting, which includes using toliet, bedpan, or urinal?: A Little Help from another person bathing (including washing, rinsing, drying)?: A Little Help from another person to put on and taking off regular upper body clothing?: A Little Help from another person to put on and taking off regular lower body clothing?: A Little 6 Click Score: 19    End of Session Equipment Utilized During Treatment: Gait belt;Rolling walker (2 wheels)  OT Visit Diagnosis: Unsteadiness on feet (R26.81);Other abnormalities of gait and mobility (R26.89);Muscle weakness (generalized) (M62.81);Pain Pain - Right/Left: Right Pain - part of body: Ankle and joints of foot   Activity Tolerance Patient limited by pain   Patient Left in chair;with call bell/phone within reach;with chair alarm set   Nurse Communication Mobility status (right great toe/foot pain)        Time: 1610-9604 OT Time Calculation (min): 23 min  Charges: OT General Charges $OT Visit: 1 Visit OT Treatments $Self Care/Home Management : 23-37 mins  Joel Stanley OT Acute Rehabilitation Services Office [phone]    Evette Georges 02/04/2023, 3:48 PM

## 2023-02-04 NOTE — Progress Notes (Signed)
PROGRESS NOTE    Joel BRINCKS  ZOX:[id number] DOB: 02/15/1941 DOA: 01/31/2023 [facility]   Brief Narrative: Joel Stanley is a 82 y.o. male with a history of sinus node dysfunction, sinus bradycardia, hypertension, diabetes mellitus type 2, CAD.  Patient presented secondary to generalized weakness with elevated blood pressure and hyperglycemia, found to have evidence of infection with concern for sepsis.  Workup significant for COVID-19 infection.  During hospitalization, patient was noted to have developed acute urinary retention requiring placement of a Foley catheter; this was complicated by traumatic hematuria requiring urology consult for placement of catheter.  Recommendation from physical therapy for discharge to SNF.   Assessment and Plan:  Sepsis Present on admission.  Patient started empirically on vancomycin and cefepime with transition to ceftriaxone.  Source appears to be SARS-CoV-2 infection.  Blood cultures obtained on admission and were significant for 1 out of 4 bottles containing Staphylococcus hominis.  Positive blood culture presumed likely secondary to contaminant.  Patient manage supportively.  COVID-19 infection Asymptomatic.  No evidence of pneumonia on chest x-ray. -Continue supportive care  Acute urinary retention Patient required Foley catheter placement by urology.  Patient started on Flomax with recommendation for outpatient follow-up with urology.  Patient started empirically on antibiotics for possible urine infection however no urine culture was obtained.  Patient completed course of 3 days of ceftriaxone IV.  Traumatic hematuria Secondary to recurrent attempts at placement of a Foley catheter.  Complicated by Lovenox and Plavix.  Urology consulted and placed Foley catheter.  Hematuria has now resolved.  Plavix continued this admission. -Follow-up with urology as an outpatient  Acute metabolic encephalopathy Likely related to  acute illness.  Appears to be resolved.  Primary hypertension -Continue lisinopril  CAD Patient is on aspirin, Plavix, Crestor. -Continue aspirin, Plavix, Crestor  History of sick sinus syndrome Patient is followed by cardiology as an outpatient.  Patient was previously on metoprolol with development of sinus bradycardia and sinus pauses.  Patient currently does not have replacement.  Diabetes mellitus type 2 Controlled with hemoglobin A1c of 6.6%.  Patient is on metformin which was resumed this admission.   DVT prophylaxis: SCDs Code Status:   Code Status: Full Code Family Communication: None at bedside Disposition Plan: Discharge to SNF once bed is available/insurance authorization obtained   Consultants:  Urology  Procedures:  None  Antimicrobials: Vancomycin Cefepime Ceftriaxone   Subjective: Patient reports no concerns this morning.  Awaiting discharge to rehab.  Objective: BP (!) 146/79 (BP Location: Left Arm)   Pulse 92   Temp 98 F (36.7 C) (Oral)   Resp 18   Wt 89.3 kg   SpO2 93%   BMI 28.25 kg/m   Examination:  General exam: Appears calm and comfortable Respiratory system: Clear to auscultation. Respiratory effort normal. Cardiovascular system: S1 & S2 heard, RRR. Gastrointestinal system: Abdomen is nondistended, soft and nontender. No organomegaly or masses felt. Normal bowel sounds heard. Central nervous system: Alert and oriented. No focal neurological deficits. Musculoskeletal: No edema. No calf tenderness Skin: No cyanosis. No rashes Psychiatry: Judgement and insight appear normal. Mood & affect appropriate.    Data Reviewed: I have personally reviewed following labs and imaging studies  CBC Lab Results  Component Value Date   WBC 5.9 02/04/2023   RBC 4.54 02/04/2023   HGB 13.9 02/04/2023   HCT 41.7 02/04/2023   MCV 91.9 02/04/2023   MCH 30.6 02/04/2023   PLT 225 02/04/2023   MCHC 33.3 02/04/2023  RDW 12.6 02/04/2023    LYMPHSABS 0.9 01/31/2023   MONOABS 0.6 01/31/2023   EOSABS 0.0 01/31/2023   BASOSABS 0.0 01/31/2023     Last metabolic panel Lab Results  Component Value Date   NA 134 (L) 02/04/2023   K 3.9 02/04/2023   CL 99 02/04/2023   CO2 22 02/04/2023   BUN 24 (H) 02/04/2023   CREATININE 1.10 02/04/2023   GLUCOSE 190 (H) 02/04/2023   GFRNONAA >60 02/04/2023   GFRAA >60 12/14/2017   CALCIUM 8.8 (L) 02/04/2023   PHOS 3.9 06/15/2020   PROT 6.7 02/04/2023   ALBUMIN 3.4 (L) 02/04/2023   BILITOT 0.5 02/04/2023   ALKPHOS 48 02/04/2023   AST 19 02/04/2023   ALT 22 02/04/2023   ANIONGAP 13 02/04/2023    GFR: Estimated Creatinine Clearance: 59.2 mL/min (by C-G formula based on SCr of 1.1 mg/dL).  Recent Results (from the past 240 hour(s))  Blood Culture (routine x 2)     Status: None (Preliminary result)   Collection Time: 01/31/23  8:27 PM   Specimen: BLOOD RIGHT ARM  Result Value Ref Range Status   Specimen Description   Final    BLOOD RIGHT ARM Performed at [facility], [address], [place], [place] [address]    Special Requests   Final    BOTTLES DRAWN AEROBIC AND ANAEROBIC Blood Culture adequate volume   Culture   Final    NO GROWTH 3 DAYS Performed at [facility], [address] [place], [place] [address]    Report Status PENDING  Incomplete  Blood Culture (routine x 2)     Status: Abnormal   Collection Time: 01/31/23  8:28 PM   Specimen: BLOOD LEFT ARM  Result Value Ref Range Status   Specimen Description   Final    BLOOD LEFT ARM Performed at [facility], [address] [place], [place] [address]    Special Requests   Final    BOTTLES DRAWN AEROBIC AND ANAEROBIC Blood Culture adequate volume Performed at [facility], [address], [place], [place] [address]    Culture  Setup Time   Final    GRAM POSITIVE COCCI IN CLUSTERS AEROBIC BOTTLE ONLY CRITICAL RESULT CALLED TO, READ BACK BY AND VERIFIED  WITH: PHARMD A. ELLINGTON 02/02/23 @ 0119 BY AB    Culture (A)  Final    STAPHYLOCOCCUS HOMINIS THE SIGNIFICANCE OF ISOLATING THIS ORGANISM FROM A SINGLE SET OF BLOOD CULTURES WHEN MULTIPLE SETS ARE DRAWN IS UNCERTAIN. PLEASE NOTIFY THE MICROBIOLOGY DEPARTMENT WITHIN ONE WEEK IF SPECIATION AND SENSITIVITIES ARE REQUIRED. UNABLE TO ISOLATE STAPHYLOCOCCUS EPIDERMIDIS FROM CULTURE Performed at [facility], [address], [place], [place] [address]    Report Status 02/02/2023 FINAL  Final  Blood Culture ID Panel (Reflexed)     Status: Abnormal   Collection Time: 01/31/23  8:28 PM  Result Value Ref Range Status   Enterococcus faecalis NOT DETECTED NOT DETECTED Final   Enterococcus Faecium NOT DETECTED NOT DETECTED Final   Listeria monocytogenes NOT DETECTED NOT DETECTED Final   Staphylococcus species DETECTED (A) NOT DETECTED Final    Comment: CRITICAL RESULT CALLED TO, READ BACK BY AND VERIFIED WITH: PHARMD A. ELLINGTON 02/02/23 @ 0119 BY AB    Staphylococcus aureus (BCID) NOT DETECTED NOT DETECTED Final   Staphylococcus epidermidis DETECTED (A) NOT DETECTED Final    Comment: CRITICAL RESULT CALLED TO, READ BACK BY AND VERIFIED WITH: PHARMD A. ELLINGTON 02/02/23 @  0119 BY AB    Staphylococcus lugdunensis NOT DETECTED NOT DETECTED Final   Streptococcus species NOT DETECTED NOT DETECTED Final   Streptococcus agalactiae NOT DETECTED NOT DETECTED Final   Streptococcus pneumoniae NOT DETECTED NOT DETECTED Final   Streptococcus pyogenes NOT DETECTED NOT DETECTED Final   A.calcoaceticus-baumannii NOT DETECTED NOT DETECTED Final   Bacteroides fragilis NOT DETECTED NOT DETECTED Final   Enterobacterales NOT DETECTED NOT DETECTED Final   Enterobacter cloacae complex NOT DETECTED NOT DETECTED Final   Escherichia coli NOT DETECTED NOT DETECTED Final   Klebsiella aerogenes NOT DETECTED NOT DETECTED Final   Klebsiella oxytoca NOT DETECTED NOT DETECTED Final   Klebsiella pneumoniae NOT DETECTED  NOT DETECTED Final   Proteus species NOT DETECTED NOT DETECTED Final   Salmonella species NOT DETECTED NOT DETECTED Final   Serratia marcescens NOT DETECTED NOT DETECTED Final   Haemophilus influenzae NOT DETECTED NOT DETECTED Final   Neisseria meningitidis NOT DETECTED NOT DETECTED Final   Pseudomonas aeruginosa NOT DETECTED NOT DETECTED Final   Stenotrophomonas maltophilia NOT DETECTED NOT DETECTED Final   Candida albicans NOT DETECTED NOT DETECTED Final   Candida auris NOT DETECTED NOT DETECTED Final   Candida glabrata NOT DETECTED NOT DETECTED Final   Candida krusei NOT DETECTED NOT DETECTED Final   Candida parapsilosis NOT DETECTED NOT DETECTED Final   Candida tropicalis NOT DETECTED NOT DETECTED Final   Cryptococcus neoformans/gattii NOT DETECTED NOT DETECTED Final   Methicillin resistance mecA/C NOT DETECTED NOT DETECTED Final    Comment: Performed at [facility], [address], [place], [place] [address]  Resp panel by RT-PCR (RSV, Flu A&B, Covid) Anterior Nasal Swab     Status: Abnormal   Collection Time: 02/01/23 12:36 AM   Specimen: Anterior Nasal Swab  Result Value Ref Range Status   SARS Coronavirus 2 by RT PCR POSITIVE (A) NEGATIVE Final    Comment: (NOTE) SARS-CoV-2 target nucleic acids are DETECTED.  The SARS-CoV-2 RNA is generally detectable in upper respiratory specimens during the acute phase of infection. Positive results are indicative of the presence of the identified virus, but do not rule out bacterial infection or co-infection with other pathogens not detected by the test. Clinical correlation with patient history and other diagnostic information is necessary to determine patient infection status. The expected result is Negative.  Fact Sheet for Patients: BloggerCourse.com  Fact Sheet for Healthcare Providers: SeriousBroker.it  This test is not yet approved or cleared by the [place] FDA and   has been authorized for detection and/or diagnosis of SARS-CoV-2 by FDA under an Emergency Use Authorization (EUA).  This EUA will remain in effect (meaning this test can be used) for the duration of  the COVID-19 declaration under Section 564(b)(1) of the A ct, 21 U.S.C. section 360bbb-3(b)(1), unless the authorization is terminated or revoked sooner.     Influenza A by PCR NEGATIVE NEGATIVE Final   Influenza B by PCR NEGATIVE NEGATIVE Final    Comment: (NOTE) The Xpert Xpress SARS-CoV-2/FLU/RSV plus assay is intended as an aid in the diagnosis of influenza from Nasopharyngeal swab specimens and should not be used as a sole basis for treatment. Nasal washings and aspirates are unacceptable for Xpert Xpress SARS-CoV-2/FLU/RSV testing.  Fact Sheet for Patients: BloggerCourse.com  Fact Sheet for Healthcare Providers: SeriousBroker.it  This test is not yet approved or cleared by the [place] FDA and has been authorized for detection and/or diagnosis of SARS-CoV-2 by FDA under an Emergency Use Authorization (EUA). This  EUA will remain in effect (meaning this test can be used) for the duration of the COVID-19 declaration under Section 564(b)(1) of the Act, 21 U.S.C. section 360bbb-3(b)(1), unless the authorization is terminated or revoked.     Resp Syncytial Virus by PCR NEGATIVE NEGATIVE Final    Comment: (NOTE) Fact Sheet for Patients: BloggerCourse.com  Fact Sheet for Healthcare Providers: SeriousBroker.it  This test is not yet approved or cleared by the [place] FDA and has been authorized for detection and/or diagnosis of SARS-CoV-2 by FDA under an Emergency Use Authorization (EUA). This EUA will remain in effect (meaning this test can be used) for the duration of the COVID-19 declaration under Section 564(b)(1) of the Act, 21 U.S.C. section 360bbb-3(b)(1), unless  the authorization is terminated or revoked.  Performed at [facility], [address], [place], [place] [address]       Radiology Studies: No results found.    LOS: 4 days    Jacquelin Hawking, MD [facility] 02/04/2023, 2:00 PM   If 7PM-7AM, please contact night-coverage [email or web address]

## 2023-02-05 DIAGNOSIS — R6510 Systemic inflammatory response syndrome (SIRS) of non-infectious origin without acute organ dysfunction: Secondary | ICD-10-CM | POA: Diagnosis not present

## 2023-02-05 LAB — COMPREHENSIVE METABOLIC PANEL
ALT: 23 U/L (ref 0–44)
AST: 15 U/L (ref 15–41)
Albumin: 3.5 g/dL (ref 3.5–5.0)
Alkaline Phosphatase: 48 U/L (ref 38–126)
Anion gap: 9 (ref 5–15)
BUN: 26 mg/dL — ABNORMAL HIGH (ref 8–23)
CO2: 24 mmol/L (ref 22–32)
Calcium: 8.7 mg/dL — ABNORMAL LOW (ref 8.9–10.3)
Chloride: 100 mmol/L (ref 98–111)
Creatinine, Ser: 1.17 mg/dL (ref 0.61–1.24)
GFR, Estimated: >60 mL/min (ref >60)
Glucose, Bld: 177 mg/dL — ABNORMAL HIGH (ref 70–99)
Potassium: 3.8 mmol/L (ref 3.5–5.1)
Sodium: 133 mmol/L — ABNORMAL LOW (ref 135–145)
Total Bilirubin: 0.7 mg/dL (ref 0.3–1.2)
Total Protein: 6.6 g/dL (ref 6.5–8.1)

## 2023-02-05 LAB — CBC
HCT: 40.2 % (ref 39.0–52.0)
Hemoglobin: 13.5 g/dL (ref 13.0–17.0)
MCH: 30.5 pg (ref 26.0–34.0)
MCHC: 33.6 g/dL (ref 30.0–36.0)
MCV: 90.7 fL (ref 80.0–100.0)
Platelets: 262 10*3/uL (ref 150–400)
RBC: 4.43 MIL/uL (ref 4.22–5.81)
RDW: 12.5 % (ref 11.5–15.5)
WBC: 6.1 10*3/uL (ref 4.0–10.5)
nRBC: 0 % (ref 0.0–0.2)

## 2023-02-05 LAB — GLUCOSE, CAPILLARY
Glucose-Capillary: 171 mg/dL — ABNORMAL HIGH (ref 70–99)
Glucose-Capillary: 202 mg/dL — ABNORMAL HIGH (ref 70–99)
Glucose-Capillary: 154 mg/dL — ABNORMAL HIGH (ref 70–99)
Glucose-Capillary: 204 mg/dL — ABNORMAL HIGH (ref 70–99)

## 2023-02-05 LAB — HEMOGLOBIN AND HEMATOCRIT, BLOOD
HCT: 36.9 % — ABNORMAL LOW (ref 39.0–52.0)
Hemoglobin: 12.4 g/dL — ABNORMAL LOW (ref 13.0–17.0)

## 2023-02-05 MED ORDER — INSULIN ASPART 100 UNIT/ML IJ SOLN
1.0000 [IU] | Freq: Once | INTRAMUSCULAR | Status: AC
  Administered 2023-02-05: 1 [IU] via SUBCUTANEOUS

## 2023-02-05 NOTE — Progress Notes (Addendum)
PROGRESS NOTE    Joel Stanley  UJW:[id number] DOB: 05/03/1941 DOA: 01/31/2023 [facility]   Brief Narrative: Joel Stanley is a 82 y.o. male with a history of sinus node dysfunction, sinus bradycardia, hypertension, diabetes mellitus type 2, CAD.  Patient presented secondary to generalized weakness with elevated blood pressure and hyperglycemia, found to have evidence of infection with concern for sepsis.  Workup significant for COVID-19 infection.  During hospitalization, patient was noted to have developed acute urinary retention requiring placement of a Foley catheter; this was complicated by traumatic hematuria requiring urology consult for placement of catheter.  Recommendation from physical therapy for discharge to SNF.   Assessment and Plan:  Sepsis Present on admission.  Patient started empirically on vancomycin and cefepime with transition to ceftriaxone.  Source appears to be SARS-CoV-2 infection.  Blood cultures obtained on admission and were significant for 1 out of 4 bottles containing Staphylococcus hominis.  Positive blood culture presumed likely secondary to contaminant.  Patient managed supportively.  COVID-19 infection Asymptomatic.  No evidence of pneumonia on chest x-ray. -Continue supportive care  Acute urinary retention Patient required Foley catheter placement by urology.  Patient started on Flomax with recommendation for outpatient follow-up with urology.  Patient started empirically on antibiotics for possible urine infection however no urine culture was obtained.  Patient completed course of 3 days of ceftriaxone IV.  Traumatic hematuria Secondary to recurrent attempts at placement of a Foley catheter.  Complicated by Lovenox and Plavix.  Urology consulted and placed Foley catheter.  Hematuria has now resolved.  Plavix continued this admission. -Follow-up with urology as an outpatient  Right toe pain Pain of right first MTP and TMT  joints. Improved with ice and colchicine. X-ray without evidence of acute fracture but significant for degenerative arthropathy of first MTP joint. -Continue colchicine for another 24-48 hours  Acute metabolic encephalopathy Likely related to acute illness.  Appears to be resolved.  Primary hypertension -Continue lisinopril  CAD Patient is on aspirin, Plavix, Crestor. -Continue aspirin, Plavix, Crestor  History of sick sinus syndrome Patient is followed by cardiology as an outpatient.  Patient was previously on metoprolol with development of sinus bradycardia and sinus pauses.  Patient currently does not have replacement.  Diabetes mellitus type 2 Controlled with hemoglobin A1c of 6.6%.  Patient is on metformin which was resumed this admission.   DVT prophylaxis: SCDs Code Status:   Code Status: Full Code Family Communication: None at bedside Disposition Plan: Discharge to SNF once bed is available/insurance authorization obtained   Consultants:  Urology  Procedures:  None  Antimicrobials: Vancomycin Cefepime Ceftriaxone   Subjective: Foot pain is better. No other concerns.  Objective: BP 108/72 (BP Location: Left Arm)   Pulse 97   Temp 98.6 F (37 C) (Oral)   Resp 18   Wt 89.3 kg   SpO2 95%   BMI 28.25 kg/m   Examination:  General exam: Appears calm and comfortable Respiratory system: Clear to auscultation. Respiratory effort normal. Cardiovascular system: S1 & S2 heard, RRR. Gastrointestinal system: Abdomen is nondistended, soft and nontender. Normal bowel sounds heard. Central nervous system: Alert and oriented. Musculoskeletal: right foot with firm nodule over first tarsometatarsal joint without significant tenderness and no significant tenderness over first MTP joint. Skin: No cyanosis. No rashes Psychiatry: Judgement and insight appear normal. Mood & affect appropriate.    Data Reviewed: I have personally reviewed following labs and imaging  studies  CBC Lab Results  Component Value Date  WBC 6.1 02/05/2023   RBC 4.43 02/05/2023   HGB 13.5 02/05/2023   HCT 40.2 02/05/2023   MCV 90.7 02/05/2023   MCH 30.5 02/05/2023   PLT 262 02/05/2023   MCHC 33.6 02/05/2023   RDW 12.5 02/05/2023   LYMPHSABS 0.9 01/31/2023   MONOABS 0.6 01/31/2023   EOSABS 0.0 01/31/2023   BASOSABS 0.0 01/31/2023     Last metabolic panel Lab Results  Component Value Date   NA 133 (L) 02/05/2023   K 3.8 02/05/2023   CL 100 02/05/2023   CO2 24 02/05/2023   BUN 26 (H) 02/05/2023   CREATININE 1.17 02/05/2023   GLUCOSE 177 (H) 02/05/2023   GFRNONAA >60 02/05/2023   GFRAA >60 12/14/2017   CALCIUM 8.7 (L) 02/05/2023   PHOS 3.9 06/15/2020   PROT 6.6 02/05/2023   ALBUMIN 3.5 02/05/2023   BILITOT 0.7 02/05/2023   ALKPHOS 48 02/05/2023   AST 15 02/05/2023   ALT 23 02/05/2023   ANIONGAP 9 02/05/2023    GFR: Estimated Creatinine Clearance: 55.7 mL/min (by C-G formula based on SCr of 1.17 mg/dL).  Recent Results (from the past 240 hour(s))  Blood Culture (routine x 2)     Status: None (Preliminary result)   Collection Time: 01/31/23  8:27 PM   Specimen: BLOOD RIGHT ARM  Result Value Ref Range Status   Specimen Description   Final    BLOOD RIGHT ARM Performed at [facility], [address], [place], [place] [address]    Special Requests   Final    BOTTLES DRAWN AEROBIC AND ANAEROBIC Blood Culture adequate volume   Culture   Final    NO GROWTH 4 DAYS Performed at [facility], [address], [place], [place] [address]    Report Status PENDING  Incomplete  Blood Culture (routine x 2)     Status: Abnormal   Collection Time: 01/31/23  8:28 PM   Specimen: BLOOD LEFT ARM  Result Value Ref Range Status   Specimen Description   Final    BLOOD LEFT ARM Performed at [facility], [address], [place], [place] [address]    Special Requests   Final    BOTTLES DRAWN AEROBIC AND ANAEROBIC Blood  Culture adequate volume Performed at [facility], [address], [place], [place] [address]    Culture  Setup Time   Final    GRAM POSITIVE COCCI IN CLUSTERS AEROBIC BOTTLE ONLY CRITICAL RESULT CALLED TO, READ BACK BY AND VERIFIED WITH: PHARMD A. ELLINGTON 02/02/23 @ 0119 BY AB    Culture (A)  Final    STAPHYLOCOCCUS HOMINIS THE SIGNIFICANCE OF ISOLATING THIS ORGANISM FROM A SINGLE SET OF BLOOD CULTURES WHEN MULTIPLE SETS ARE DRAWN IS UNCERTAIN. PLEASE NOTIFY THE MICROBIOLOGY DEPARTMENT WITHIN ONE WEEK IF SPECIATION AND SENSITIVITIES ARE REQUIRED. UNABLE TO ISOLATE STAPHYLOCOCCUS EPIDERMIDIS FROM CULTURE Performed at [facility], [address], [place], [place] [address]    Report Status 02/02/2023 FINAL  Final  Blood Culture ID Panel (Reflexed)     Status: Abnormal   Collection Time: 01/31/23  8:28 PM  Result Value Ref Range Status   Enterococcus faecalis NOT DETECTED NOT DETECTED Final   Enterococcus Faecium NOT DETECTED NOT DETECTED Final   Listeria monocytogenes NOT DETECTED NOT DETECTED Final   Staphylococcus species DETECTED (A) NOT DETECTED Final    Comment: CRITICAL RESULT CALLED TO, READ BACK BY AND VERIFIED WITH: PHARMD A. ELLINGTON 02/02/23 @ 0119 BY AB  Staphylococcus aureus (BCID) NOT DETECTED NOT DETECTED Final   Staphylococcus epidermidis DETECTED (A) NOT DETECTED Final    Comment: CRITICAL RESULT CALLED TO, READ BACK BY AND VERIFIED WITH: PHARMD A. ELLINGTON 02/02/23 @ 0119 BY AB    Staphylococcus lugdunensis NOT DETECTED NOT DETECTED Final   Streptococcus species NOT DETECTED NOT DETECTED Final   Streptococcus agalactiae NOT DETECTED NOT DETECTED Final   Streptococcus pneumoniae NOT DETECTED NOT DETECTED Final   Streptococcus pyogenes NOT DETECTED NOT DETECTED Final   A.calcoaceticus-baumannii NOT DETECTED NOT DETECTED Final   Bacteroides fragilis NOT DETECTED NOT DETECTED Final   Enterobacterales NOT DETECTED NOT DETECTED Final    Enterobacter cloacae complex NOT DETECTED NOT DETECTED Final   Escherichia coli NOT DETECTED NOT DETECTED Final   Klebsiella aerogenes NOT DETECTED NOT DETECTED Final   Klebsiella oxytoca NOT DETECTED NOT DETECTED Final   Klebsiella pneumoniae NOT DETECTED NOT DETECTED Final   Proteus species NOT DETECTED NOT DETECTED Final   Salmonella species NOT DETECTED NOT DETECTED Final   Serratia marcescens NOT DETECTED NOT DETECTED Final   Haemophilus influenzae NOT DETECTED NOT DETECTED Final   Neisseria meningitidis NOT DETECTED NOT DETECTED Final   Pseudomonas aeruginosa NOT DETECTED NOT DETECTED Final   Stenotrophomonas maltophilia NOT DETECTED NOT DETECTED Final   Candida albicans NOT DETECTED NOT DETECTED Final   Candida auris NOT DETECTED NOT DETECTED Final   Candida glabrata NOT DETECTED NOT DETECTED Final   Candida krusei NOT DETECTED NOT DETECTED Final   Candida parapsilosis NOT DETECTED NOT DETECTED Final   Candida tropicalis NOT DETECTED NOT DETECTED Final   Cryptococcus neoformans/gattii NOT DETECTED NOT DETECTED Final   Methicillin resistance mecA/C NOT DETECTED NOT DETECTED Final    Comment: Performed at [facility], [address] [place], [place] [address]  Resp panel by RT-PCR (RSV, Flu A&B, Covid) Anterior Nasal Swab     Status: Abnormal   Collection Time: 02/01/23 12:36 AM   Specimen: Anterior Nasal Swab  Result Value Ref Range Status   SARS Coronavirus 2 by RT PCR POSITIVE (A) NEGATIVE Final    Comment: (NOTE) SARS-CoV-2 target nucleic acids are DETECTED.  The SARS-CoV-2 RNA is generally detectable in upper respiratory specimens during the acute phase of infection. Positive results are indicative of the presence of the identified virus, but do not rule out bacterial infection or co-infection with other pathogens not detected by the test. Clinical correlation with patient history and other diagnostic information is necessary to determine patient infection status.  The expected result is Negative.  Fact Sheet for Patients: BloggerCourse.com  Fact Sheet for Healthcare Providers: SeriousBroker.it  This test is not yet approved or cleared by the [place] FDA and  has been authorized for detection and/or diagnosis of SARS-CoV-2 by FDA under an Emergency Use Authorization (EUA).  This EUA will remain in effect (meaning this test can be used) for the duration of  the COVID-19 declaration under Section 564(b)(1) of the A ct, 21 U.S.C. section 360bbb-3(b)(1), unless the authorization is terminated or revoked sooner.     Influenza A by PCR NEGATIVE NEGATIVE Final   Influenza B by PCR NEGATIVE NEGATIVE Final    Comment: (NOTE) The Xpert Xpress SARS-CoV-2/FLU/RSV plus assay is intended as an aid in the diagnosis of influenza from Nasopharyngeal swab specimens and should not be used as a sole basis for treatment. Nasal washings and aspirates are unacceptable for Xpert Xpress SARS-CoV-2/FLU/RSV testing.  Fact Sheet for Patients: BloggerCourse.com  Fact Sheet for  Healthcare Providers: SeriousBroker.it  This test is not yet approved or cleared by the [place] and has been authorized for detection and/or diagnosis of SARS-CoV-2 by FDA under an Emergency Use Authorization (EUA). This EUA will remain in effect (meaning this test can be used) for the duration of the COVID-19 declaration under Section 564(b)(1) of the Act, 21 U.S.C. section 360bbb-3(b)(1), unless the authorization is terminated or revoked.     Resp Syncytial Virus by PCR NEGATIVE NEGATIVE Final    Comment: (NOTE) Fact Sheet for Patients: BloggerCourse.com  Fact Sheet for Healthcare Providers: SeriousBroker.it  This test is not yet approved or cleared by the [place] FDA and has been authorized for detection and/or  diagnosis of SARS-CoV-2 by FDA under an Emergency Use Authorization (EUA). This EUA will remain in effect (meaning this test can be used) for the duration of the COVID-19 declaration under Section 564(b)(1) of the Act, 21 U.S.C. section 360bbb-3(b)(1), unless the authorization is terminated or revoked.  Performed at [facility], [address], [place], [place] [address]       Radiology Studies: DG Foot 2 Views Right  Result Date: 02/04/2023 CLINICAL DATA:  Right great toe pain EXAM: RIGHT FOOT - 2 VIEW COMPARISON:  None Available. FINDINGS: Heavily oblique and nonstandard frontal projection with extension of the MTP joints, reportedly best obtainable image due to patient mobility. Reduced diagnostic sensitivity based on nonstandard projection. Suspected degenerative arthropathy at the first MCP joint along with mild spurring of the first digit sesamoids. Possible soft tissue swelling along the medial plantar ball of the foot. Mild dorsal soft tissue swelling in the forefoot. I do not see a definite erosion. IMPRESSION: 1. 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 2. Soft tissue swelling along the medial plantar ball of the foot and dorsal forefoot., and also potentially medial to the first MTP joint. 3. Reduced diagnostic sensitivity and specificity due to inability to obtain standard imaging. Electronically Signed   By: Gaylyn Rong M.D.   On: 02/04/2023 20:00      LOS: 5 days    Jacquelin Hawking, MD [facility] 02/05/2023, 12:50 PM   If 7PM-7AM, please contact night-coverage [email or web address]

## 2023-02-05 NOTE — TOC Progression Note (Signed)
Transition of Care [facility]) - Progression Note    Patient Details  Name: Joel Stanley MRN: [id number] Date of Birth: June 09, 1941  [facility] CM/SW Contact  Otelia Santee, LCSW Phone Number: 02/05/2023, 1:59 PM  Clinical Narrative:    Pt's [employer] insurance auth still pending.  [facility] approved SNF placement however, [facility] is not a contracted facility. Referrals sent out to [facility] contracted facilities for review.   Pt able to transfer to SNF pending [facility] approval or acceptance to [place] contracted facility.    Expected Discharge Plan: Home w Home Health Services Barriers to Discharge: No Barriers Identified  Expected Discharge Plan and Services In-house Referral: NA Discharge Planning Services: NA Post Acute Care Choice: Home Health Living arrangements for the past 2 months: Single Family Home                 DME Arranged: N/A DME Agency: NA       HH Arranged: PT HH Agency: [facility] Date HH Agency Contacted: 02/02/23 Time HH Agency Contacted: 1417 Representative spoke with at [facility] Agency: Angie   Social Determinants of Health (SDOH) Interventions SDOH Screenings   Food Insecurity: No Food Insecurity (02/01/2023)  Housing: Patient Unable To Answer (02/01/2023)  Transportation Needs: Patient Declined (02/01/2023)  Utilities: Patient Declined (02/01/2023)  Social Connections: Unknown (09/08/2022)   Received from [facility]  Tobacco Use: Medium Risk (01/31/2023)    Readmission Risk Interventions    02/04/2023   12:52 PM 02/03/2023    3:24 PM 02/02/2023    2:16 PM  Readmission Risk Prevention Plan  Post Dischage Appt Complete Complete Complete  Medication Screening Complete Complete Complete  Transportation Screening Complete Complete Complete

## 2023-02-05 NOTE — Progress Notes (Signed)
Physical Therapy Treatment Patient Details Name: Joel Stanley MRN: [id number] DOB: Jun 05, 1941 Today's Date: 02/05/2023   History of Present Illness 82 yo male admitted with SIRS, acute met encephalopathy, urinary retention, COVID. Hx of COVID, CAD, DM, CKD, MI, hernia repair, bil TKAs, OSA, sinus bradycardia, angina    PT Comments  Pt continues cooperative and progressing with mobility but limited by ongoing R foot and back pain.   If plan is discharge home, recommend the following: A lot of help with bathing/dressing/bathroom;A lot of help with walking and/or transfers;Assistance with cooking/housework;Assist for transportation;Help with stairs or ramp for entrance   Can travel by private vehicle     Yes  Equipment Recommendations  None recommended by PT    Recommendations for Other Services OT consult     Precautions / Restrictions Precautions Precautions: Fall Restrictions Weight Bearing Restrictions: No     Mobility  Bed Mobility Overal bed mobility: Needs Assistance Bed Mobility: Supine to Sit     Supine to sit: Min assist, Mod assist     General bed mobility comments: INcreased time with assist to bring trunk to upright; pt reports assist needed 2* back pain from too much time in bed    Transfers Overall transfer level: Needs assistance Equipment used: Rolling walker (2 wheels) Transfers: Sit to/from Stand Sit to Stand: Contact guard assist           General transfer comment: Steady assist only    Ambulation/Gait Ambulation/Gait assistance: Contact guard assist Gait Distance (Feet): 120 Feet (twice) Assistive device: Rolling walker (2 wheels) Gait Pattern/deviations: Step-to pattern, Step-through pattern, Decreased step length - right, Decreased step length - left, Shuffle, Trunk flexed       General Gait Details: Gait mildly antalgic 2* R foot. Cues for posture, RW proximity   Stairs             Wheelchair Mobility     Tilt Bed     Modified Rankin (Stroke Patients Only)       Balance Overall balance assessment: Needs assistance Sitting-balance support: No upper extremity supported, Feet supported Sitting balance-Leahy Scale: Good     Standing balance support: No upper extremity supported, During functional activity Standing balance-Leahy Scale: Fair                              Cognition Arousal: Alert Behavior During Therapy: WFL for tasks assessed/performed Overall Cognitive Status: Within Functional Limits for tasks assessed                                          Exercises      General Comments        Pertinent Vitals/Pain Pain Assessment Pain Assessment: 0-10 Pain Score: 6  Pain Location: R great toe and foot with WB; no pain at rest Pain Descriptors / Indicators: Aching, Sore Pain Intervention(s): Limited activity within patient's tolerance, Monitored during session    Home Living                          Prior Function            PT Goals (current goals can now be found in the care plan section) Acute Rehab PT Goals Patient Stated Goal: to be able to get back home; to regain independence PT  Goal Formulation: With patient Time For Goal Achievement: 02/15/23 Potential to Achieve Goals: Good Progress towards PT goals: Progressing toward goals    Frequency    Min 1X/week      PT Plan      Co-evaluation              AM-PAC PT "6 Clicks" Mobility   Outcome Measure  Help needed turning from your back to your side while in a flat bed without using bedrails?: A Little Help needed moving from lying on your back to sitting on the side of a flat bed without using bedrails?: A Little Help needed moving to and from a bed to a chair (including a wheelchair)?: A Little Help needed standing up from a chair using your arms (e.g., wheelchair or bedside chair)?: A Little Help needed to walk in hospital room?: A Little Help needed  climbing 3-5 steps with a railing? : A Little 6 Click Score: 18    End of Session Equipment Utilized During Treatment: Gait belt Activity Tolerance: Patient tolerated treatment well;Patient limited by pain Patient left: in chair;with call bell/phone within reach;with chair alarm set Nurse Communication: Mobility status PT Visit Diagnosis: Pain;Difficulty in walking, not elsewhere classified (R26.2) Pain - Right/Left: Right Pain - part of body: Ankle and joints of foot     Time: 2458-0998 PT Time Calculation (min) (ACUTE ONLY): 26 min  Charges:    $Gait Training: 23-37 mins PT General Charges $$ ACUTE PT VISIT: 1 Visit                     Mauro Kaufmann PT Acute Rehabilitation Services Pager [phone] Office [phone]    , 02/05/2023, 2:49 PM

## 2023-02-05 NOTE — Plan of Care (Signed)

## 2023-02-05 NOTE — Plan of Care (Signed)
  Problem: Education: Goal: Ability to describe self-care measures that may prevent or decrease complications (Diabetes Survival Skills Education) will improve Outcome: Progressing Goal: Individualized Educational Video(s) Outcome: Progressing   Problem: Coping: Goal: Ability to adjust to condition or change in health will improve Outcome: Progressing   Problem: Health Behavior/Discharge Planning: Goal: Ability to identify and utilize available resources and services will improve Outcome: Progressing Goal: Ability to manage health-related needs will improve Outcome: Progressing   Problem: Nutritional: Goal: Maintenance of adequate nutrition will improve Outcome: Progressing   Problem: Tissue Perfusion: Goal: Adequacy of tissue perfusion will improve Outcome: Progressing   Problem: Education: Goal: Knowledge of General Education information will improve Description: Including pain rating scale, medication(s)/side effects and non-pharmacologic comfort measures Outcome: Progressing   Problem: Health Behavior/Discharge Planning: Goal: Ability to manage health-related needs will improve Outcome: Progressing   Problem: Clinical Measurements: Goal: Ability to maintain clinical measurements within normal limits will improve Outcome: Progressing   Problem: Elimination: Goal: Will not experience complications related to urinary retention Outcome: Not Progressing Note: Foley in place for retention. POC to DC pt with cath still in place.

## 2023-02-06 ENCOUNTER — Other Ambulatory Visit (HOSPITAL_COMMUNITY): Payer: Self-pay

## 2023-02-06 DIAGNOSIS — R6510 Systemic inflammatory response syndrome (SIRS) of non-infectious origin without acute organ dysfunction: Secondary | ICD-10-CM | POA: Diagnosis not present

## 2023-02-06 LAB — GLUCOSE, CAPILLARY
Glucose-Capillary: 179 mg/dL — ABNORMAL HIGH (ref 70–99)
Glucose-Capillary: 159 mg/dL — ABNORMAL HIGH (ref 70–99)
Glucose-Capillary: 170 mg/dL — ABNORMAL HIGH (ref 70–99)

## 2023-02-06 LAB — CULTURE, BLOOD (ROUTINE X 2)
Culture: NO GROWTH
Special Requests: ADEQUATE

## 2023-02-06 LAB — HEMOGLOBIN AND HEMATOCRIT, BLOOD
HCT: 39.3 % (ref 39.0–52.0)
Hemoglobin: 12.9 g/dL — ABNORMAL LOW (ref 13.0–17.0)

## 2023-02-06 MED ORDER — TAMSULOSIN HCL 0.4 MG PO CAPS
0.4000 mg | ORAL_CAPSULE | Freq: Every day | ORAL | 0 refills | Status: DC
  Filled 2023-02-06: qty 30, 30d supply, fill #0

## 2023-02-06 NOTE — Progress Notes (Signed)
Mobility Specialist - Progress Note   02/06/23 1314  Mobility  Activity Ambulated with assistance in hallway  Level of Assistance Modified independent, requires aide device or extra time  Assistive Device Front wheel walker  Distance Ambulated (ft) 500 ft  Range of Motion/Exercises Active  Activity Response Tolerated well  Mobility Referral Yes  $Mobility charge 1 Mobility  Mobility Specialist Start Time (ACUTE ONLY) 1300  Mobility Specialist Stop Time (ACUTE ONLY) 1315  Mobility Specialist Time Calculation (min) (ACUTE ONLY) 15 min   Pt received in chair and agreed to mobility. Had no issues throughout session, returned to chair with all needs met.  Marilynne Halsted Mobility Specialist

## 2023-02-06 NOTE — Progress Notes (Signed)
PROGRESS NOTE    Joel Stanley  ZOX:[id number] DOB: 28-Aug-1940 DOA: 01/31/2023 [facility]   Brief Narrative: Joel Stanley is a 82 y.o. male with a history of sinus node dysfunction, sinus bradycardia, hypertension, diabetes mellitus type 2, CAD.  Patient presented secondary to generalized weakness with elevated blood pressure and hyperglycemia, found to have evidence of infection with concern for sepsis.  Workup significant for COVID-19 infection.  During hospitalization, patient was noted to have developed acute urinary retention requiring placement of a Foley catheter; this was complicated by traumatic hematuria requiring urology consult for placement of catheter.  Recommendation from physical therapy for discharge to SNF, however insurance denied SNF discharge. Patient plan for discharge home with home health.   Assessment and Plan:  Sepsis Present on admission.  Patient started empirically on vancomycin and cefepime with transition to ceftriaxone.  Source appears to be SARS-CoV-2 infection.  Blood cultures obtained on admission and were significant for 1 out of 4 bottles containing Staphylococcus hominis.  Positive blood culture presumed likely secondary to contaminant.  Patient managed supportively.  COVID-19 infection Asymptomatic.  No evidence of pneumonia on chest x-ray. -Continue supportive care  Acute urinary retention Patient required Foley catheter placement by urology.  Patient started on Flomax with recommendation for outpatient follow-up with urology.  Patient started empirically on antibiotics for possible urine infection however no urine culture was obtained.  Patient completed course of 3 days of ceftriaxone IV.  Traumatic hematuria Secondary to recurrent attempts at placement of a Foley catheter.  Complicated by Lovenox and Plavix.  Urology consulted and placed Foley catheter.  Hematuria has now resolved.  Plavix continued this  admission. -Follow-up with urology as an outpatient  Right toe pain Pain of right first MTP and TMT joints. Improved with ice and colchicine. X-ray without evidence of acute fracture but significant for degenerative arthropathy of first MTP joint. Symptoms improved. -Discontinue colchicine  Acute metabolic encephalopathy Likely related to acute illness.  Appears to be resolved.  Primary hypertension -Continue lisinopril  CAD Patient is on aspirin, Plavix, Crestor. -Continue aspirin, Plavix, Crestor  History of sick sinus syndrome Patient is followed by cardiology as an outpatient.  Patient was previously on metoprolol with development of sinus bradycardia and sinus pauses.  Patient currently does not have replacement.  Diabetes mellitus type 2 Controlled with hemoglobin A1c of 6.6%.  Patient is on metformin which was resumed this admission.   DVT prophylaxis: SCDs Code Status:   Code Status: Full Code Family Communication: None at bedside Disposition Plan: Discharge to SNF once bed is available/insurance authorization obtained   Consultants:  Urology  Procedures:  8/11: Foley catheter placement  Antimicrobials: Vancomycin Cefepime Ceftriaxone   Subjective: No concerns this morning except the concern that insurance will deny his transfer to SNF.  Objective: BP 125/78 (BP Location: Right Arm)   Pulse 81   Temp 98.1 F (36.7 C) (Oral)   Resp 20   Wt 89.8 kg   SpO2 93%   BMI 28.41 kg/m   Examination:  General exam: Appears calm and comfortable Respiratory system: Respiratory effort normal. Central nervous system: Alert and oriented. Psychiatry: Judgement and insight appear normal.    Data Reviewed: I have personally reviewed following labs and imaging studies  CBC Lab Results  Component Value Date   WBC 6.1 02/05/2023   RBC 4.43 02/05/2023   HGB 12.9 (L) 02/06/2023   HCT 39.3 02/06/2023   MCV 90.7 02/05/2023   MCH 30.5 02/05/2023  PLT 262  02/05/2023   MCHC 33.6 02/05/2023   RDW 12.5 02/05/2023   LYMPHSABS 0.9 01/31/2023   MONOABS 0.6 01/31/2023   EOSABS 0.0 01/31/2023   BASOSABS 0.0 01/31/2023     Last metabolic panel Lab Results  Component Value Date   NA 133 (L) 02/05/2023   K 3.8 02/05/2023   CL 100 02/05/2023   CO2 24 02/05/2023   BUN 26 (H) 02/05/2023   CREATININE 1.17 02/05/2023   GLUCOSE 177 (H) 02/05/2023   GFRNONAA >60 02/05/2023   GFRAA >60 12/14/2017   CALCIUM 8.7 (L) 02/05/2023   PHOS 3.9 06/15/2020   PROT 6.6 02/05/2023   ALBUMIN 3.5 02/05/2023   BILITOT 0.7 02/05/2023   ALKPHOS 48 02/05/2023   AST 15 02/05/2023   ALT 23 02/05/2023   ANIONGAP 9 02/05/2023    GFR: Estimated Creatinine Clearance: 55.8 mL/min (by C-G formula based on SCr of 1.17 mg/dL).  Recent Results (from the past 240 hour(s))  Blood Culture (routine x 2)     Status: None   Collection Time: 01/31/23  8:27 PM   Specimen: BLOOD RIGHT ARM  Result Value Ref Range Status   Specimen Description   Final    BLOOD RIGHT ARM Performed at [facility], [address] [place], [place] [address]    Special Requests   Final    BOTTLES DRAWN AEROBIC AND ANAEROBIC Blood Culture adequate volume   Culture   Final    NO GROWTH 5 DAYS Performed at [facility], [address] [place], [place] [address]    Report Status 02/06/2023 FINAL  Final  Blood Culture (routine x 2)     Status: Abnormal   Collection Time: 01/31/23  8:28 PM   Specimen: BLOOD LEFT ARM  Result Value Ref Range Status   Specimen Description   Final    BLOOD LEFT ARM Performed at [facility], [address], [place], [place] [address]    Special Requests   Final    BOTTLES DRAWN AEROBIC AND ANAEROBIC Blood Culture adequate volume Performed at [facility], [address], [place], [place] [address]    Culture  Setup Time   Final    GRAM POSITIVE COCCI IN CLUSTERS AEROBIC BOTTLE ONLY CRITICAL  RESULT CALLED TO, READ BACK BY AND VERIFIED WITH: PHARMD A. ELLINGTON 02/02/23 @ 0119 BY AB    Culture (A)  Final    STAPHYLOCOCCUS HOMINIS THE SIGNIFICANCE OF ISOLATING THIS ORGANISM FROM A SINGLE SET OF BLOOD CULTURES WHEN MULTIPLE SETS ARE DRAWN IS UNCERTAIN. PLEASE NOTIFY THE MICROBIOLOGY DEPARTMENT WITHIN ONE WEEK IF SPECIATION AND SENSITIVITIES ARE REQUIRED. UNABLE TO ISOLATE STAPHYLOCOCCUS EPIDERMIDIS FROM CULTURE Performed at [facility], [address], [place], [place] [address]    Report Status 02/02/2023 FINAL  Final  Blood Culture ID Panel (Reflexed)     Status: Abnormal   Collection Time: 01/31/23  8:28 PM  Result Value Ref Range Status   Enterococcus faecalis NOT DETECTED NOT DETECTED Final   Enterococcus Faecium NOT DETECTED NOT DETECTED Final   Listeria monocytogenes NOT DETECTED NOT DETECTED Final   Staphylococcus species DETECTED (A) NOT DETECTED Final    Comment: CRITICAL RESULT CALLED TO, READ BACK BY AND VERIFIED WITH: PHARMD A. ELLINGTON 02/02/23 @ 0119 BY AB    Staphylococcus aureus (BCID) NOT DETECTED NOT DETECTED Final   Staphylococcus epidermidis DETECTED (A) NOT DETECTED Final    Comment: CRITICAL RESULT CALLED TO, READ BACK  BY AND VERIFIED WITH: PHARMD A. ELLINGTON 02/02/23 @ 0119 BY AB    Staphylococcus lugdunensis NOT DETECTED NOT DETECTED Final   Streptococcus species NOT DETECTED NOT DETECTED Final   Streptococcus agalactiae NOT DETECTED NOT DETECTED Final   Streptococcus pneumoniae NOT DETECTED NOT DETECTED Final   Streptococcus pyogenes NOT DETECTED NOT DETECTED Final   A.calcoaceticus-baumannii NOT DETECTED NOT DETECTED Final   Bacteroides fragilis NOT DETECTED NOT DETECTED Final   Enterobacterales NOT DETECTED NOT DETECTED Final   Enterobacter cloacae complex NOT DETECTED NOT DETECTED Final   Escherichia coli NOT DETECTED NOT DETECTED Final   Klebsiella aerogenes NOT DETECTED NOT DETECTED Final   Klebsiella oxytoca NOT DETECTED NOT DETECTED  Final   Klebsiella pneumoniae NOT DETECTED NOT DETECTED Final   Proteus species NOT DETECTED NOT DETECTED Final   Salmonella species NOT DETECTED NOT DETECTED Final   Serratia marcescens NOT DETECTED NOT DETECTED Final   Haemophilus influenzae NOT DETECTED NOT DETECTED Final   Neisseria meningitidis NOT DETECTED NOT DETECTED Final   Pseudomonas aeruginosa NOT DETECTED NOT DETECTED Final   Stenotrophomonas maltophilia NOT DETECTED NOT DETECTED Final   Candida albicans NOT DETECTED NOT DETECTED Final   Candida auris NOT DETECTED NOT DETECTED Final   Candida glabrata NOT DETECTED NOT DETECTED Final   Candida krusei NOT DETECTED NOT DETECTED Final   Candida parapsilosis NOT DETECTED NOT DETECTED Final   Candida tropicalis NOT DETECTED NOT DETECTED Final   Cryptococcus neoformans/gattii NOT DETECTED NOT DETECTED Final   Methicillin resistance mecA/C NOT DETECTED NOT DETECTED Final    Comment: Performed at [facility], [address], [place], [place] [address]  Resp panel by RT-PCR (RSV, Flu A&B, Covid) Anterior Nasal Swab     Status: Abnormal   Collection Time: 02/01/23 12:36 AM   Specimen: Anterior Nasal Swab  Result Value Ref Range Status   SARS Coronavirus 2 by RT PCR POSITIVE (A) NEGATIVE Final    Comment: (NOTE) SARS-CoV-2 target nucleic acids are DETECTED.  The SARS-CoV-2 RNA is generally detectable in upper respiratory specimens during the acute phase of infection. Positive results are indicative of the presence of the identified virus, but do not rule out bacterial infection or co-infection with other pathogens not detected by the test. Clinical correlation with patient history and other diagnostic information is necessary to determine patient infection status. The expected result is Negative.  Fact Sheet for Patients: BloggerCourse.com  Fact Sheet for Healthcare Providers: SeriousBroker.it  This test is not yet  approved or cleared by the [place] FDA and  has been authorized for detection and/or diagnosis of SARS-CoV-2 by FDA under an Emergency Use Authorization (EUA).  This EUA will remain in effect (meaning this test can be used) for the duration of  the COVID-19 declaration under Section 564(b)(1) of the A ct, 21 U.S.C. section 360bbb-3(b)(1), unless the authorization is terminated or revoked sooner.     Influenza A by PCR NEGATIVE NEGATIVE Final   Influenza B by PCR NEGATIVE NEGATIVE Final    Comment: (NOTE) The Xpert Xpress SARS-CoV-2/FLU/RSV plus assay is intended as an aid in the diagnosis of influenza from Nasopharyngeal swab specimens and should not be used as a sole basis for treatment. Nasal washings and aspirates are unacceptable for Xpert Xpress SARS-CoV-2/FLU/RSV testing.  Fact Sheet for Patients: BloggerCourse.com  Fact Sheet for Healthcare Providers: SeriousBroker.it  This test is not yet approved or cleared by the [place] FDA and has been authorized for detection and/or diagnosis of SARS-CoV-2  by FDA under an Emergency Use Authorization (EUA). This EUA will remain in effect (meaning this test can be used) for the duration of the COVID-19 declaration under Section 564(b)(1) of the Act, 21 U.S.C. section 360bbb-3(b)(1), unless the authorization is terminated or revoked.     Resp Syncytial Virus by PCR NEGATIVE NEGATIVE Final    Comment: (NOTE) Fact Sheet for Patients: BloggerCourse.com  Fact Sheet for Healthcare Providers: SeriousBroker.it  This test is not yet approved or cleared by the [place] FDA and has been authorized for detection and/or diagnosis of SARS-CoV-2 by FDA under an Emergency Use Authorization (EUA). This EUA will remain in effect (meaning this test can be used) for the duration of the COVID-19 declaration under Section 564(b)(1) of  the Act, 21 U.S.C. section 360bbb-3(b)(1), unless the authorization is terminated or revoked.  Performed at [facility], [address], [place], [place] [address]       Radiology Studies: DG Foot 2 Views Right  Result Date: 02/04/2023 CLINICAL DATA:  Right great toe pain EXAM: RIGHT FOOT - 2 VIEW COMPARISON:  None Available. FINDINGS: Heavily oblique and nonstandard frontal projection with extension of the MTP joints, reportedly best obtainable image due to patient mobility. Reduced diagnostic sensitivity based on nonstandard projection. Suspected degenerative arthropathy at the first MCP joint along with mild spurring of the first digit sesamoids. Possible soft tissue swelling along the medial plantar ball of the foot. Mild dorsal soft tissue swelling in the forefoot. I do not see a definite erosion. IMPRESSION: 1. 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 2. Soft tissue swelling along the medial plantar ball of the foot and dorsal forefoot., and also potentially medial to the first MTP joint. 3. Reduced diagnostic sensitivity and specificity due to inability to obtain standard imaging. Electronically Signed   By: Gaylyn Rong M.D.   On: 02/04/2023 20:00      LOS: 6 days    Jacquelin Hawking, MD [facility] 02/06/2023, 3:38 PM   If 7PM-7AM, please contact night-coverage [email or web address]

## 2023-02-06 NOTE — Discharge Instructions (Signed)
Joel Stanley,  You are in the hospital because of infection.  You had initial concern for a bacterial infection, but your symptoms appear to have been secondary to COVID-19.  This is managed conservatively.  While you are here, you had urinary retention requiring the placement of a Foley catheter.  During attempts to place, you had trauma resulting in blood from your urine.  The urologist had to place a catheter in.  You have had intermittent blood in your urine, however your blood counts are stable and you do not have persistent symptoms.  You will need to follow-up with the urologist for follow-up of your catheter.  If you have worsening/persistent bloody urine, please either call your primary care doctor for advice or return to the hospital for reevaluation.

## 2023-02-06 NOTE — Plan of Care (Signed)

## 2023-02-06 NOTE — TOC Transition Note (Signed)
Transition of Care [facility]) - CM/SW Discharge Note   Patient Details  Name: Joel Stanley MRN: [id number] Date of Birth: 09-12-1940  [facility] CM/SW Contact:  Otelia Santee, LCSW Phone Number: 02/06/2023, 4:05 PM   Clinical Narrative:    Pt's [employer] denied SNF placement.  For [facility] contracted facilities pt would be required to complete 10-day isolation period for COVID.  Pt does not want to remain in the hospital to complete isolation.  Spoke with pt's girlfriend, [name] w/ pt in room. She is agreeable to pt returning home however, has her concerns around caring for pt's catheter and level of support.  Spoke with pt's son who is agreeable to pt returning home with home health.  HHPT/OT/RN/Aide to be provided by [employer].  Pt's son able to provide transportation home for pt.    Final next level of care: Home w Home Health Services Barriers to Discharge: No Barriers Identified   Patient Goals and CMS Choice CMS Medicare.gov Compare Post Acute Care list provided to:: Patient Choice offered to / list presented to : Patient  Discharge Placement                         Discharge Plan and Services Additional resources added to the After Visit Summary for   In-house Referral: NA Discharge Planning Services: NA Post Acute Care Choice: Home Health          DME Arranged: N/A DME Agency: NA       HH Arranged: RN, PT, OT, Nurse's Aide HH Agency: [facility] Date [facility] Agency Contacted: 02/06/23 Time HH Agency Contacted: 1605 Representative spoke with at [facility] Agency: Angie  Social Determinants of Health (SDOH) Interventions SDOH Screenings   Food Insecurity: No Food Insecurity (02/01/2023)  Housing: Patient Unable To Answer (02/01/2023)  Transportation Needs: Patient Declined (02/01/2023)  Utilities: Patient Declined (02/01/2023)  Social Connections: Unknown (09/08/2022)   Received from [facility]  Tobacco Use: Medium Risk  (01/31/2023)     Readmission Risk Interventions    02/06/2023    4:05 PM 02/04/2023   12:52 PM 02/03/2023    3:24 PM  Readmission Risk Prevention Plan  Post Dischage Appt  Complete Complete  Medication Screening  Complete Complete  Transportation Screening Complete Complete Complete  PCP or Specialist Appt within 5-7 Days Complete    Home Care Screening Complete    Medication Review (RN CM) Complete

## 2023-02-06 NOTE — Discharge Summary (Signed)
Physician Discharge Summary   Patient: Joel Stanley MRN: [id number] DOB: 04-20-1941  Admit date:     01/31/2023  Discharge date: 02/06/23  Discharge Physician: Jacquelin Hawking MD   PCP: [facility]   Recommendations at discharge:  Hospital follow-up with PCP Neurology follow-up voiding trial/removal of Foley catheter  Discharge Diagnoses: Principal Problem:   SIRS (systemic inflammatory response syndrome) (HCC) Active Problems:   Essential hypertension   Acute metabolic encephalopathy   Non-insulin dependent type 2 diabetes mellitus (HCC)   CAD (coronary artery disease)   OSA (obstructive sleep apnea)   History of sinus bradycardia with sinus node dysfunction   Hyperglycemia related-hyponatremia   CKD (chronic kidney disease), stage II  Resolved Problems:   * No resolved hospital problems. *  Hospital Course: BOUBACAR SHINER is a 82 y.o. male with a history of sinus node dysfunction, sinus bradycardia, hypertension, diabetes mellitus type 2, CAD.  Patient presented secondary to generalized weakness with elevated blood pressure and hyperglycemia, found to have evidence of infection with concern for sepsis.  Workup significant for COVID-19 infection.  During hospitalization, patient was noted to have developed acute urinary retention requiring placement of a Foley catheter; this was complicated by traumatic hematuria requiring urology consult for placement of catheter.  Recommendation from physical therapy for discharge to SNF, however insurance denied SNF discharge. Patient plan for discharge home with home health.  Assessment and Plan:  Sepsis Present on admission.  Patient started empirically on vancomycin and cefepime with transition to ceftriaxone.  Source appears to be SARS-CoV-2 infection.  Blood cultures obtained on admission and were significant for 1 out of 4 bottles containing Staphylococcus hominis.  Positive blood culture presumed likely secondary to  contaminant.  Patient managed supportively.   COVID-19 infection Asymptomatic.  No evidence of pneumonia on chest x-ray.  Patient managed with supportive care.   Acute urinary retention Patient required Foley catheter placement by urology.  Patient started on Flomax with recommendation for outpatient follow-up with urology.  Patient started empirically on antibiotics for possible urine infection however no urine culture was obtained.  Patient completed course of 3 days of ceftriaxone IV.   Traumatic hematuria Secondary to recurrent attempts at placement of a Foley catheter.  Complicated by Lovenox and Plavix.  Urology consulted and placed Foley catheter.  Hematuria has now resolved.  Plavix continued this admission. Follow-up with urology as an outpatient.   Right toe pain Pain of right first MTP and TMT joints. Improved with ice and colchicine. X-ray without evidence of acute fracture but significant for degenerative arthropathy of first MTP joint. Symptoms improved.  Colchicine discontinued prior to discharge   Acute metabolic encephalopathy Likely related to acute illness.  Appears to be resolved.   Primary hypertension Continue lisinopril.  Amlodipine held secondary to normotension.   CAD Patient is on aspirin, Plavix, Crestor.  Continue on discharge  History of sick sinus syndrome Patient is followed by cardiology as an outpatient.  Patient was previously on metoprolol with development of sinus bradycardia and sinus pauses.  Patient currently does not have pacemaker.  Patient to follow-up with outpatient cardiology as previously directed.   Diabetes mellitus type 2 Controlled with hemoglobin A1c of 6.6%.  Patient is on metformin which was resumed this admission.   Consultants:  Urology   Procedures:  8/11: Foley catheter placement   Antimicrobials: Vancomycin Cefepime Ceftriaxone  Disposition: Home Diet recommendation: Carb modified diet   DISCHARGE  MEDICATION: Allergies as of 02/06/2023  Reactions   Yellow Jacket Venom    Yellow Jacket Venom         Medication List     STOP taking these medications    amLODipine 5 MG tablet Commonly known as: NORVASC       TAKE these medications    acetaminophen 650 MG CR tablet Commonly known as: TYLENOL Take 650 mg by mouth as needed.   aspirin 81 MG chewable tablet Chew 1 tablet (81 mg total) by mouth daily. What changed:  when to take this reasons to take this   clopidogrel 75 MG tablet Commonly known as: PLAVIX Take 75 mg by mouth daily.   EPINEPHrine 0.3 mg/0.3 mL Soaj injection Commonly known as: EPI-PEN   famotidine 20 MG tablet Commonly known as: PEPCID Take 1 tablet by mouth 2 (two) times daily.   lisinopril 40 MG tablet Commonly known as: ZESTRIL Take 1 tablet (40 mg total) by mouth daily.   memantine 10 MG tablet Commonly known as: NAMENDA Take 10 mg by mouth daily.   metFORMIN 500 MG tablet Commonly known as: GLUCOPHAGE Take 1 tablet (500 mg total) by mouth 2 (two) times daily with a meal. What changed: how much to take   nitroGLYCERIN 0.4 MG SL tablet Commonly known as: NITROSTAT Place 1 tablet (0.4 mg total) under the tongue every 5 (five) minutes x 3 doses as needed for chest pain.   rosuvastatin 10 MG tablet Commonly known as: CRESTOR Take 10 mg by mouth every evening.   tamsulosin 0.4 MG Caps capsule Commonly known as: FLOMAX Take 1 capsule (0.4 mg total) by mouth daily after breakfast. Start taking on: February 07, 2023   Vitamin D-3 25 MCG (1000 UT) Caps Take 1,000 Units by mouth daily.        Follow-up Information     [facility] Follow up.   Why: [facility] will follow up with you at disharge to provide home health services        [facility]. Schedule an appointment as soon as possible for a visit in 1 week(s).   Why: For hospital follow-up Contact information: [address] [place]  [address] [phone]         Crist Fat, MD. Schedule an appointment as soon as possible for a visit in 1 week(s).   Specialty: Urology Why: Voiding trial/foley removal Contact information: [address] [place] [address] [phone]                Discharge Exam: BP 125/78 (BP Location: Right Arm)   Pulse 81   Temp 98.1 F (36.7 C) (Oral)   Resp 20   Wt 89.8 kg   SpO2 93%   BMI 28.41 kg/m   General exam: Appears calm and comfortable Respiratory system: Respiratory effort normal. Central nervous system: Alert and oriented. Psychiatry: Judgement and insight appear normal.   Condition at discharge: stable  The results of significant diagnostics from this hospitalization (including imaging, microbiology, ancillary and laboratory) are listed below for reference.   Imaging Studies: DG Foot 2 Views Right  Result Date: 02/04/2023 CLINICAL DATA:  Right great toe pain EXAM: RIGHT FOOT - 2 VIEW COMPARISON:  None Available. FINDINGS: Heavily oblique and nonstandard frontal projection with extension of the MTP joints, reportedly best obtainable image due to patient mobility. Reduced diagnostic sensitivity based on nonstandard projection. Suspected degenerative arthropathy at the first MCP joint along with mild spurring of the first digit sesamoids. Possible soft tissue swelling along  the medial plantar ball of the foot. Mild dorsal soft tissue swelling in the forefoot. I do not see a definite erosion. IMPRESSION: 1. 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 2. Soft tissue swelling along the medial plantar ball of the foot and dorsal forefoot., and also potentially medial to the first MTP joint. 3. Reduced diagnostic sensitivity and specificity due to inability to obtain standard imaging. Electronically Signed   By: Gaylyn Rong M.D.   On: 02/04/2023 20:00   DG Chest Port 1 View  Result Date: 01/31/2023 CLINICAL DATA:  Hypertension and tachycardia EXAM:  PORTABLE CHEST 1 VIEW COMPARISON:  05/20/2022 FINDINGS: Cardiac shadow is stable. Lungs are well aerated bilaterally. No focal infiltrate or effusion is seen. No bony abnormality is noted. IMPRESSION: No acute abnormality noted. Electronically Signed   By: Alcide Clever M.D.   On: 01/31/2023 21:42    Microbiology: Results for orders placed or performed during the hospital encounter of 01/31/23  Blood Culture (routine x 2)     Status: None   Collection Time: 01/31/23  8:27 PM   Specimen: BLOOD RIGHT ARM  Result Value Ref Range Status   Specimen Description   Final    BLOOD RIGHT ARM Performed at [facility], [address] [place], [place] [address]    Special Requests   Final    BOTTLES DRAWN AEROBIC AND ANAEROBIC Blood Culture adequate volume   Culture   Final    NO GROWTH 5 DAYS Performed at [facility], [address] [place], [place] [address]    Report Status 02/06/2023 FINAL  Final  Blood Culture (routine x 2)     Status: Abnormal   Collection Time: 01/31/23  8:28 PM   Specimen: BLOOD LEFT ARM  Result Value Ref Range Status   Specimen Description   Final    BLOOD LEFT ARM Performed at [facility], [address], [place], [place] [address]    Special Requests   Final    BOTTLES DRAWN AEROBIC AND ANAEROBIC Blood Culture adequate volume Performed at [facility], [address], [place], [place] [address]    Culture  Setup Time   Final    GRAM POSITIVE COCCI IN CLUSTERS AEROBIC BOTTLE ONLY CRITICAL RESULT CALLED TO, READ BACK BY AND VERIFIED WITH: PHARMD A. ELLINGTON 02/02/23 @ 0119 BY AB    Culture (A)  Final    STAPHYLOCOCCUS HOMINIS THE SIGNIFICANCE OF ISOLATING THIS ORGANISM FROM A SINGLE SET OF BLOOD CULTURES WHEN MULTIPLE SETS ARE DRAWN IS UNCERTAIN. PLEASE NOTIFY THE MICROBIOLOGY DEPARTMENT WITHIN ONE WEEK IF SPECIATION AND SENSITIVITIES ARE REQUIRED. UNABLE TO ISOLATE STAPHYLOCOCCUS EPIDERMIDIS FROM  CULTURE Performed at [facility], [address], [place], [place] [address]    Report Status 02/02/2023 FINAL  Final  Blood Culture ID Panel (Reflexed)     Status: Abnormal   Collection Time: 01/31/23  8:28 PM  Result Value Ref Range Status   Enterococcus faecalis NOT DETECTED NOT DETECTED Final   Enterococcus Faecium NOT DETECTED NOT DETECTED Final   Listeria monocytogenes NOT DETECTED NOT DETECTED Final   Staphylococcus species DETECTED (A) NOT DETECTED Final    Comment: CRITICAL RESULT CALLED TO, READ BACK BY AND VERIFIED WITH: PHARMD A. ELLINGTON 02/02/23 @ 0119 BY AB    Staphylococcus aureus (BCID) NOT DETECTED NOT DETECTED Final   Staphylococcus epidermidis DETECTED (A) NOT DETECTED Final    Comment: CRITICAL RESULT CALLED TO, READ BACK BY AND  VERIFIED WITH: PHARMD A. ELLINGTON 02/02/23 @ 0119 BY AB    Staphylococcus lugdunensis NOT DETECTED NOT DETECTED Final   Streptococcus species NOT DETECTED NOT DETECTED Final   Streptococcus agalactiae NOT DETECTED NOT DETECTED Final   Streptococcus pneumoniae NOT DETECTED NOT DETECTED Final   Streptococcus pyogenes NOT DETECTED NOT DETECTED Final   A.calcoaceticus-baumannii NOT DETECTED NOT DETECTED Final   Bacteroides fragilis NOT DETECTED NOT DETECTED Final   Enterobacterales NOT DETECTED NOT DETECTED Final   Enterobacter cloacae complex NOT DETECTED NOT DETECTED Final   Escherichia coli NOT DETECTED NOT DETECTED Final   Klebsiella aerogenes NOT DETECTED NOT DETECTED Final   Klebsiella oxytoca NOT DETECTED NOT DETECTED Final   Klebsiella pneumoniae NOT DETECTED NOT DETECTED Final   Proteus species NOT DETECTED NOT DETECTED Final   Salmonella species NOT DETECTED NOT DETECTED Final   Serratia marcescens NOT DETECTED NOT DETECTED Final   Haemophilus influenzae NOT DETECTED NOT DETECTED Final   Neisseria meningitidis NOT DETECTED NOT DETECTED Final   Pseudomonas aeruginosa NOT DETECTED NOT DETECTED Final   Stenotrophomonas  maltophilia NOT DETECTED NOT DETECTED Final   Candida albicans NOT DETECTED NOT DETECTED Final   Candida auris NOT DETECTED NOT DETECTED Final   Candida glabrata NOT DETECTED NOT DETECTED Final   Candida krusei NOT DETECTED NOT DETECTED Final   Candida parapsilosis NOT DETECTED NOT DETECTED Final   Candida tropicalis NOT DETECTED NOT DETECTED Final   Cryptococcus neoformans/gattii NOT DETECTED NOT DETECTED Final   Methicillin resistance mecA/C NOT DETECTED NOT DETECTED Final    Comment: Performed at [facility], [address], [place], [place] [address]  Resp panel by RT-PCR (RSV, Flu A&B, Covid) Anterior Nasal Swab     Status: Abnormal   Collection Time: 02/01/23 12:36 AM   Specimen: Anterior Nasal Swab  Result Value Ref Range Status   SARS Coronavirus 2 by RT PCR POSITIVE (A) NEGATIVE Final    Comment: (NOTE) SARS-CoV-2 target nucleic acids are DETECTED.  The SARS-CoV-2 RNA is generally detectable in upper respiratory specimens during the acute phase of infection. Positive results are indicative of the presence of the identified virus, but do not rule out bacterial infection or co-infection with other pathogens not detected by the test. Clinical correlation with patient history and other diagnostic information is necessary to determine patient infection status. The expected result is Negative.  Fact Sheet for Patients: BloggerCourse.com  Fact Sheet for Healthcare Providers: SeriousBroker.it  This test is not yet approved or cleared by the [place] FDA and  has been authorized for detection and/or diagnosis of SARS-CoV-2 by FDA under an Emergency Use Authorization (EUA).  This EUA will remain in effect (meaning this test can be used) for the duration of  the COVID-19 declaration under Section 564(b)(1) of the A ct, 21 U.S.C. section 360bbb-3(b)(1), unless the authorization is terminated or revoked sooner.      Influenza A by PCR NEGATIVE NEGATIVE Final   Influenza B by PCR NEGATIVE NEGATIVE Final    Comment: (NOTE) The Xpert Xpress SARS-CoV-2/FLU/RSV plus assay is intended as an aid in the diagnosis of influenza from Nasopharyngeal swab specimens and should not be used as a sole basis for treatment. Nasal washings and aspirates are unacceptable for Xpert Xpress SARS-CoV-2/FLU/RSV testing.  Fact Sheet for Patients: BloggerCourse.com  Fact Sheet for Healthcare Providers: SeriousBroker.it  This test is not yet approved or cleared by the [place] FDA and has been authorized for detection and/or diagnosis of SARS-CoV-2 by FDA  under an Emergency Use Authorization (EUA). This EUA will remain in effect (meaning this test can be used) for the duration of the COVID-19 declaration under Section 564(b)(1) of the Act, 21 U.S.C. section 360bbb-3(b)(1), unless the authorization is terminated or revoked.     Resp Syncytial Virus by PCR NEGATIVE NEGATIVE Final    Comment: (NOTE) Fact Sheet for Patients: BloggerCourse.com  Fact Sheet for Healthcare Providers: SeriousBroker.it  This test is not yet approved or cleared by the [place] FDA and has been authorized for detection and/or diagnosis of SARS-CoV-2 by FDA under an Emergency Use Authorization (EUA). This EUA will remain in effect (meaning this test can be used) for the duration of the COVID-19 declaration under Section 564(b)(1) of the Act, 21 U.S.C. section 360bbb-3(b)(1), unless the authorization is terminated or revoked.  Performed at [facility], [address], [place], [place] [address]     Labs: CBC: Recent Labs  Lab 01/31/23 2023 02/01/23 0500 02/02/23 0532 02/03/23 0536 02/04/23 0510 02/05/23 0513 02/05/23 2138 02/06/23 0503  WBC 6.7 8.0 5.6 4.8 5.9 6.1  --   --   NEUTROABS 5.2  --   --   --    --   --   --   --   HGB 14.5 14.4 14.1 14.0 13.9 13.5 12.4* 12.9*  HCT 42.8 43.0 43.2 41.6 41.7 40.2 36.9* 39.3  MCV 89.0 89.4 92.5 91.2 91.9 90.7  --   --   PLT 251 230 216 226 225 262  --   --    Basic Metabolic Panel: Recent Labs  Lab 02/01/23 0500 02/02/23 0532 02/03/23 0536 02/04/23 0510 02/05/23 0513  NA 134* 134* 135 134* 133*  K 3.6 3.9 3.9 3.9 3.8  CL 100 99 99 99 100  CO2 21* 24 24 22 24   GLUCOSE 260* 190* 199* 190* 177*  BUN 16 20 22  24* 26*  CREATININE 0.98 1.15 1.16 1.10 1.17  CALCIUM 8.9 9.1 8.9 8.8* 8.7*   Liver Function Tests: Recent Labs  Lab 02/01/23 0500 02/02/23 0532 02/03/23 0536 02/04/23 0510 02/05/23 0513  AST 16 15 16 19 15   ALT 15 14 20 22 23   ALKPHOS 46 41 43 48 48  BILITOT 1.1 0.6 0.6 0.5 0.7  PROT 7.2 6.4* 6.6 6.7 6.6  ALBUMIN 3.8 3.3* 3.3* 3.4* 3.5   CBG: Recent Labs  Lab 02/05/23 1620 02/05/23 2144 02/06/23 0739 02/06/23 1158 02/06/23 1639  GLUCAP 154* 204* 179* 159* 170*    Discharge time spent: 35 minutes.  Signed: Jacquelin Hawking, MD [facility] 02/06/2023

## 2023-02-10 ENCOUNTER — Emergency Department (HOSPITAL_COMMUNITY)
Admission: EM | Admit: 2023-02-10 | Discharge: 2023-02-10 | Disposition: A | Discharge status: Home / Self Care | Payer: No Typology Code available for payment source | Attending: Emergency Medicine | Admitting: Emergency Medicine

## 2023-02-10 ENCOUNTER — Ambulatory Visit: Payer: No Typology Code available for payment source | Admitting: Cardiovascular Disease

## 2023-02-10 ENCOUNTER — Other Ambulatory Visit: Payer: Self-pay

## 2023-02-10 ENCOUNTER — Encounter (HOSPITAL_COMMUNITY): Payer: Self-pay | Admitting: Radiology

## 2023-02-10 DIAGNOSIS — S3730XA Unspecified injury of urethra, initial encounter: Secondary | ICD-10-CM | POA: Insufficient documentation

## 2023-02-10 DIAGNOSIS — X58XXXA Exposure to other specified factors, initial encounter: Secondary | ICD-10-CM | POA: Diagnosis not present

## 2023-02-10 DIAGNOSIS — Z7902 Long term (current) use of antithrombotics/antiplatelets: Secondary | ICD-10-CM | POA: Insufficient documentation

## 2023-02-10 DIAGNOSIS — T83028A Displacement of other indwelling urethral catheter, initial encounter: Secondary | ICD-10-CM | POA: Diagnosis present

## 2023-02-10 DIAGNOSIS — Z7982 Long term (current) use of aspirin: Secondary | ICD-10-CM | POA: Insufficient documentation

## 2023-02-10 MED ORDER — LIDOCAINE HCL URETHRAL/MUCOSAL 2 % EX GEL
1.0000 | Freq: Once | CUTANEOUS | Status: AC
  Administered 2023-02-10: 1 via URETHRAL
  Filled 2023-02-10: qty 11

## 2023-02-10 NOTE — ED Triage Notes (Signed)
Pt states he had a foley catheter placed here on Friday and didn't believe he needed it so today he pulled it out. Balloon intact when ems arrived. Large amount of blood noted by ems and in pt pull up but not actively bleeding at this time.

## 2023-02-10 NOTE — ED Provider Notes (Signed)
[place] EMERGENCY DEPARTMENT AT [facility] Provider Note   CSN: [id number] Arrival date & time: 02/10/23  1538     History  No chief complaint on file.   Joel Stanley is a 82 y.o. male.  82 yo M with a cc of taking his Foley catheter out.  The patient recently had a catheter placed for a cardiac procedure and unfortunate was able to urinate on his own and so they left it in.  He did not think that he had received any information to follow-up with the urologist and decided that it had been in long enough and so he pulled it out on his own today.  He found it to be more difficult than he expected and removed with the balloon inflated.  Had some significant urethral bleeding afterwards.  He does not feel he has had any difficulty urinating.        Home Medications Prior to Admission medications   Medication Sig Start Date End Date Taking? Authorizing Provider  acetaminophen (TYLENOL) 650 MG CR tablet Take 650 mg by mouth as needed.    [provider]  aspirin 81 MG chewable tablet Chew 1 tablet (81 mg total) by mouth daily. Patient taking differently: Chew 81 mg by mouth as needed for mild pain. 12/15/17   Joseph Art, DO  Cholecalciferol (VITAMIN D-3) 1000 UNITS CAPS Take 1,000 Units by mouth daily.    [provider]  clopidogrel (PLAVIX) 75 MG tablet Take 75 mg by mouth daily.    [provider]  EPINEPHrine 0.3 mg/0.3 mL IJ SOAJ injection  08/02/20   [provider]  famotidine (PEPCID) 20 MG tablet Take 1 tablet by mouth 2 (two) times daily. 08/02/20   [provider]  lisinopril (ZESTRIL) 40 MG tablet Take 1 tablet (40 mg total) by mouth daily. 06/17/20   Azucena Fallen, MD  memantine (NAMENDA) 10 MG tablet Take 10 mg by mouth daily. 07/02/22   [provider]  metFORMIN (GLUCOPHAGE) 500 MG tablet Take 1 tablet (500 mg total) by mouth 2 (two) times daily with a meal. Patient taking differently: Take  1,000 mg by mouth 2 (two) times daily with a meal. 12/17/17   Joseph Art, DO  nitroGLYCERIN (NITROSTAT) 0.4 MG SL tablet Place 1 tablet (0.4 mg total) under the tongue every 5 (five) minutes x 3 doses as needed for chest pain. 07/29/22   [name], MD  rosuvastatin (CRESTOR) 10 MG tablet Take 10 mg by mouth every evening.    [provider]  tamsulosin (FLOMAX) 0.4 MG CAPS capsule Take 1 capsule (0.4 mg total) by mouth daily after breakfast. 02/07/23 03/09/23  Narda Bonds, MD      Allergies    Yellow jacket venom and Yellow jacket venom    Review of Systems   Review of Systems  Physical Exam Updated Vital Signs BP 111/73 (BP Location: Right Arm)   Pulse 89   Temp 98 F (36.7 C) (Oral)   Resp 17   Ht 5\' 10"  (1.778 m)   Wt 89.8 kg   SpO2 100%   BMI 28.41 kg/m  Physical Exam Vitals and nursing note reviewed.  Constitutional:      Appearance: He is well-developed.  HENT:     Head: Normocephalic and atraumatic.  Eyes:     Pupils: Pupils are equal, round, and reactive to light.  Neck:     Vascular: No JVD.  Cardiovascular:  Rate and Rhythm: Normal rate and regular rhythm.     Heart sounds: No murmur heard.    No friction rub. No gallop.  Pulmonary:     Effort: No respiratory distress.     Breath sounds: No wheezing.  Abdominal:     General: There is no distension.     Tenderness: There is no abdominal tenderness. There is no guarding or rebound.  Musculoskeletal:        General: Normal range of motion.     Cervical back: Normal range of motion and neck supple.  Skin:    Coloration: Skin is not pale.     Findings: No rash.  Neurological:     Mental Status: He is alert and oriented to person, place, and time.  Psychiatric:        Behavior: Behavior normal.     ED Results / Procedures / Treatments   Labs (all labs ordered are listed, but only abnormal results are displayed) Labs Reviewed - No data to display  EKG None  Radiology No  results found.  Procedures Procedures    Medications Ordered in ED Medications  lidocaine (XYLOCAINE) 2 % jelly 1 Application (1 Application Urethral Given 02/10/23 1702)    ED Course/ Medical Decision Making/ A&P                                 Medical Decision Making  82 yo M with a chief complaints of hematuria after removing his Foley catheter with the balloon inflated.  Patient tells me he recently had a procedure that required a Foley to be placed but on my record review the patient actually was admitted for COVID-19 ended up having acute urinary retention and had Foley catheter placement.  Had to be placed by the urologist.  Will make an attempt here.  Foley placed by nursing without issue.  Patient continues to feel well.  Clear drainage of urine.  Will discharge home.  Urology follow-up.  6:10 PM:  I have discussed the diagnosis/risks/treatment options with the patient and family.  Evaluation and diagnostic testing in the emergency department does not suggest an emergent condition requiring admission or immediate intervention beyond what has been performed at this time.  They will follow up with Urology. We also discussed returning to the ED immediately if new or worsening sx occur. We discussed the sx which are most concerning (e.g., sudden worsening pain, fever, inability to tolerate by mouth) that necessitate immediate return. Medications administered to the patient during their visit and any new prescriptions provided to the patient are listed below.  Medications given during this visit Medications  lidocaine (XYLOCAINE) 2 % jelly 1 Application (1 Application Urethral Given 02/10/23 1702)     The patient appears reasonably screen and/or stabilized for discharge and I doubt any other medical condition or other [facility] requiring further screening, evaluation, or treatment in the ED at this time prior to discharge.          Final Clinical Impression(s) / ED Diagnoses Final  diagnoses:  Urethral injury, closed, initial encounter    Rx / DC Orders ED Discharge Orders     None         Melene Plan, DO 02/10/23 1810

## 2023-02-10 NOTE — Discharge Instructions (Signed)
Call the urology office tomorrow to schedule an appointment.  Typically they would see you in about a week and evaluate you in the office.  Please return if you feel like it stops draining or you start having significant discomfort to your lower abdomen.

## 2023-02-25 ENCOUNTER — Other Ambulatory Visit: Payer: Self-pay

## 2023-02-25 ENCOUNTER — Encounter (HOSPITAL_COMMUNITY): Payer: Self-pay

## 2023-02-25 ENCOUNTER — Emergency Department (HOSPITAL_COMMUNITY)
Admission: EM | Admit: 2023-02-25 | Discharge: 2023-02-25 | Disposition: A | Discharge status: Home / Self Care | Payer: No Typology Code available for payment source | Attending: Emergency Medicine | Admitting: Emergency Medicine

## 2023-02-25 DIAGNOSIS — T83511A Infection and inflammatory reaction due to indwelling urethral catheter, initial encounter: Secondary | ICD-10-CM | POA: Diagnosis not present

## 2023-02-25 DIAGNOSIS — Z466 Encounter for fitting and adjustment of urinary device: Secondary | ICD-10-CM | POA: Diagnosis not present

## 2023-02-25 DIAGNOSIS — N390 Urinary tract infection, site not specified: Secondary | ICD-10-CM

## 2023-02-25 DIAGNOSIS — I2510 Atherosclerotic heart disease of native coronary artery without angina pectoris: Secondary | ICD-10-CM | POA: Insufficient documentation

## 2023-02-25 DIAGNOSIS — I10 Essential (primary) hypertension: Secondary | ICD-10-CM | POA: Insufficient documentation

## 2023-02-25 DIAGNOSIS — Z79899 Other long term (current) drug therapy: Secondary | ICD-10-CM | POA: Diagnosis not present

## 2023-02-25 DIAGNOSIS — R350 Frequency of micturition: Secondary | ICD-10-CM | POA: Diagnosis present

## 2023-02-25 DIAGNOSIS — Y732 Prosthetic and other implants, materials and accessory gastroenterology and urology devices associated with adverse incidents: Secondary | ICD-10-CM | POA: Insufficient documentation

## 2023-02-25 DIAGNOSIS — D649 Anemia, unspecified: Secondary | ICD-10-CM | POA: Diagnosis not present

## 2023-02-25 DIAGNOSIS — Z7902 Long term (current) use of antithrombotics/antiplatelets: Secondary | ICD-10-CM | POA: Diagnosis not present

## 2023-02-25 DIAGNOSIS — E119 Type 2 diabetes mellitus without complications: Secondary | ICD-10-CM | POA: Diagnosis not present

## 2023-02-25 DIAGNOSIS — Z7982 Long term (current) use of aspirin: Secondary | ICD-10-CM | POA: Diagnosis not present

## 2023-02-25 DIAGNOSIS — Z7984 Long term (current) use of oral hypoglycemic drugs: Secondary | ICD-10-CM | POA: Insufficient documentation

## 2023-02-25 LAB — URINALYSIS, ROUTINE W REFLEX MICROSCOPIC
Bilirubin Urine: NEGATIVE
Glucose, UA: NEGATIVE mg/dL
Ketones, ur: NEGATIVE mg/dL
Nitrite: NEGATIVE
Protein, ur: NEGATIVE mg/dL
Specific Gravity, Urine: 1.003 — ABNORMAL LOW (ref 1.005–1.030)
WBC, UA: >50 WBC/hpf (ref 0–5)
pH: 5 (ref 5.0–8.0)

## 2023-02-25 LAB — CBC WITH DIFFERENTIAL/PLATELET
Abs Immature Granulocytes: 0.02 10*3/uL (ref 0.00–0.07)
Basophils Absolute: 0 10*3/uL (ref 0.0–0.1)
Basophils Relative: 0 %
Eosinophils Absolute: 0 10*3/uL (ref 0.0–0.5)
Eosinophils Relative: 0 %
HCT: 34.1 % — ABNORMAL LOW (ref 39.0–52.0)
Hemoglobin: 11.3 g/dL — ABNORMAL LOW (ref 13.0–17.0)
Immature Granulocytes: 0 %
Lymphocytes Relative: 9 %
Lymphs Abs: 0.9 10*3/uL (ref 0.7–4.0)
MCH: 30.4 pg (ref 26.0–34.0)
MCHC: 33.1 g/dL (ref 30.0–36.0)
MCV: 91.7 fL (ref 80.0–100.0)
Monocytes Absolute: 0.9 10*3/uL (ref 0.1–1.0)
Monocytes Relative: 8 %
Neutro Abs: 8.6 10*3/uL — ABNORMAL HIGH (ref 1.7–7.7)
Neutrophils Relative %: 83 %
Platelets: 294 10*3/uL (ref 150–400)
RBC: 3.72 MIL/uL — ABNORMAL LOW (ref 4.22–5.81)
RDW: 13 % (ref 11.5–15.5)
WBC: 10.4 10*3/uL (ref 4.0–10.5)
nRBC: 0 % (ref 0.0–0.2)

## 2023-02-25 LAB — COMPREHENSIVE METABOLIC PANEL WITH GFR
ALT: 12 U/L (ref 0–44)
AST: 13 U/L — ABNORMAL LOW (ref 15–41)
Albumin: 3.6 g/dL (ref 3.5–5.0)
Alkaline Phosphatase: 42 U/L (ref 38–126)
Anion gap: 9 (ref 5–15)
BUN: 23 mg/dL (ref 8–23)
CO2: 22 mmol/L (ref 22–32)
Calcium: 8.4 mg/dL — ABNORMAL LOW (ref 8.9–10.3)
Chloride: 103 mmol/L (ref 98–111)
Creatinine, Ser: 1.17 mg/dL (ref 0.61–1.24)
GFR, Estimated: >60 mL/min
Glucose, Bld: 167 mg/dL — ABNORMAL HIGH (ref 70–99)
Potassium: 4 mmol/L (ref 3.5–5.1)
Sodium: 134 mmol/L — ABNORMAL LOW (ref 135–145)
Total Bilirubin: 0.8 mg/dL (ref 0.3–1.2)
Total Protein: 6.6 g/dL (ref 6.5–8.1)

## 2023-02-25 MED ORDER — SODIUM CHLORIDE 0.9 % IV BOLUS
1000.0000 mL | Freq: Once | INTRAVENOUS | Status: AC
  Administered 2023-02-25: 1000 mL via INTRAVENOUS

## 2023-02-25 MED ORDER — CEPHALEXIN 500 MG PO CAPS: 500.0000 mg | ORAL_CAPSULE | Freq: Three times a day (TID) | ORAL | 0 refills | Status: AC

## 2023-02-25 MED ORDER — CEPHALEXIN 500 MG PO CAPS
500.0000 mg | ORAL_CAPSULE | Freq: Once | ORAL | Status: AC
  Administered 2023-02-25: 500 mg via ORAL
  Filled 2023-02-25: qty 1

## 2023-02-25 MED ORDER — TAMSULOSIN HCL 0.4 MG PO CAPS: 0.4000 mg | ORAL_CAPSULE | Freq: Every day | ORAL | 0 refills | Status: AC

## 2023-02-25 NOTE — ED Triage Notes (Signed)
EMS reports that pt has a foley catheter in place that is leaking and urine smells strong. Pt is from home, spouse called EMS. EMS reports that pt was orthostatic. Pt had Covid and was weak so they put a foley catheter in place per EMS.

## 2023-02-25 NOTE — ED Provider Notes (Signed)
[name] EMERGENCY DEPARTMENT AT [facility] Provider Note   CSN: [id number] Arrival date & time: 02/25/23  1926     History  Chief Complaint  Patient presents with   Urinary Frequency    Possible UTI, leaking from foley catheter    Joel Stanley is a 82 y.o. male.  Pt is a 82 yo male with pmhx significant for CAD, sleep apnea, htn, hld, dm2, and urinary retention.  Pt was seriously ill from a covid infection and was admitted from 8/10-8/16.  While hospitalized, he developed urinary retention and required a foley catheter to be placed.  He said it has caused a lot of discomfort since it was placed.  He tried to remove it himself on 8/20, but did not know he had to deflate the balloon and developed a urethral injury.  He came back to the ED and a foley was replaced then.  He has an appointment on 9/6 with [facility], but the catheter was leaking and causing discomfort, so he came to the ED.  Pt requests removal.  No fevers.       Home Medications Prior to Admission medications   Medication Sig Start Date End Date Taking? Authorizing Provider  cephALEXin (KEFLEX) 500 MG capsule Take 1 capsule (500 mg total) by mouth 3 (three) times daily. 02/25/23  Yes Jacalyn Lefevre, MD  tamsulosin (FLOMAX) 0.4 MG CAPS capsule Take 1 capsule (0.4 mg total) by mouth daily. 02/25/23  Yes Jacalyn Lefevre, MD  acetaminophen (TYLENOL) 650 MG CR tablet Take 650 mg by mouth as needed.    [provider]  aspirin 81 MG chewable tablet Chew 1 tablet (81 mg total) by mouth daily. Patient taking differently: Chew 81 mg by mouth as needed for mild pain. 12/15/17   Joseph Art, DO  Cholecalciferol (VITAMIN D-3) 1000 UNITS CAPS Take 1,000 Units by mouth daily.    [provider]  clopidogrel (PLAVIX) 75 MG tablet Take 75 mg by mouth daily.    [provider]  EPINEPHrine 0.3 mg/0.3 mL IJ SOAJ injection  08/02/20   [provider]  famotidine (PEPCID) 20 MG  tablet Take 1 tablet by mouth 2 (two) times daily. 08/02/20   [provider]  lisinopril (ZESTRIL) 40 MG tablet Take 1 tablet (40 mg total) by mouth daily. 06/17/20   Azucena Fallen, MD  memantine (NAMENDA) 10 MG tablet Take 10 mg by mouth daily. 07/02/22   [provider]  metFORMIN (GLUCOPHAGE) 500 MG tablet Take 1 tablet (500 mg total) by mouth 2 (two) times daily with a meal. Patient taking differently: Take 1,000 mg by mouth 2 (two) times daily with a meal. 12/17/17   Joseph Art, DO  nitroGLYCERIN (NITROSTAT) 0.4 MG SL tablet Place 1 tablet (0.4 mg total) under the tongue every 5 (five) minutes x 3 doses as needed for chest pain. 07/29/22   [name], MD  rosuvastatin (CRESTOR) 10 MG tablet Take 10 mg by mouth every evening.    [provider]      Allergies    Yellow jacket venom and Yellow jacket venom    Review of Systems   Review of Systems  Genitourinary:  Positive for dysuria.  All other systems reviewed and are negative.   Physical Exam Updated Vital Signs BP 131/79   Pulse 77   Temp 99.3 F (37.4 C) (Oral)   Resp 17   Ht 5\' 11"  (1.803 m)   Wt 88.8  kg   SpO2 96%   BMI 27.30 kg/m  Physical Exam Vitals and nursing note reviewed.  Constitutional:      Appearance: Normal appearance.  HENT:     Head: Normocephalic and atraumatic.     Right Ear: External ear normal.     Left Ear: External ear normal.     Nose: Nose normal.     Mouth/Throat:     Mouth: Mucous membranes are moist.     Pharynx: Oropharynx is clear.  Eyes:     Extraocular Movements: Extraocular movements intact.     Conjunctiva/sclera: Conjunctivae normal.     Pupils: Pupils are equal, round, and reactive to light.  Cardiovascular:     Rate and Rhythm: Normal rate and regular rhythm.     Pulses: Normal pulses.     Heart sounds: Normal heart sounds.  Pulmonary:     Effort: Pulmonary effort is normal.     Breath sounds: Normal breath sounds.  Abdominal:      General: Abdomen is flat. Bowel sounds are normal.     Palpations: Abdomen is soft.  Genitourinary:    Comments: Fc in place Musculoskeletal:        General: Normal range of motion.     Cervical back: Normal range of motion and neck supple.  Skin:    General: Skin is warm.     Capillary Refill: Capillary refill takes less than 2 seconds.  Neurological:     General: No focal deficit present.     Mental Status: He is alert and oriented to person, place, and time.  Psychiatric:        Mood and Affect: Mood normal.        Behavior: Behavior normal.     ED Results / Procedures / Treatments   Labs (all labs ordered are listed, but only abnormal results are displayed) Labs Reviewed  COMPREHENSIVE METABOLIC PANEL - Abnormal; Notable for the following components:      Result Value   Sodium 134 (*)    Glucose, Bld 167 (*)    Calcium 8.4 (*)    AST 13 (*)    All other components within normal limits  CBC WITH DIFFERENTIAL/PLATELET - Abnormal; Notable for the following components:   RBC 3.72 (*)    Hemoglobin 11.3 (*)    HCT 34.1 (*)    Neutro Abs 8.6 (*)    All other components within normal limits  URINALYSIS, ROUTINE W REFLEX MICROSCOPIC - Abnormal; Notable for the following components:   Color, Urine STRAW (*)    APPearance HAZY (*)    Specific Gravity, Urine 1.003 (*)    Hgb urine dipstick MODERATE (*)    Leukocytes,Ua LARGE (*)    Bacteria, UA RARE (*)    All other components within normal limits  URINE CULTURE    EKG None  Radiology No results found.  Procedures Procedures    Medications Ordered in ED Medications  sodium chloride 0.9 % bolus 1,000 mL (0 mLs Intravenous Stopped 02/25/23 2152)  cephALEXin (KEFLEX) capsule 500 mg (500 mg Oral Given 02/25/23 2303)    ED Course/ Medical Decision Making/ A&P                                 Medical Decision Making Amount and/or Complexity of Data Reviewed Labs: ordered.  Risk Prescription drug  management.   This patient presents to the ED for concern of  dysuria, this involves an extensive number of treatment options, and is a complaint that carries with it a high risk of complications and morbidity.  The differential diagnosis includes uti, fc problem   Co morbidities that complicate the patient evaluation  CAD, sleep apnea, htn, hld, dm2, and urinary retention   Additional history obtained:  Additional history obtained from epic chart review External records from outside source obtained and reviewed including son   Lab Tests:  I Ordered, and personally interpreted labs.  The pertinent results include:  cbc with mild anemia (hgb 11.3; hgb 12.9 2 weeks ago, but he had a lot of urethral bleeding after he removed fc); cmp nl other than bs elevated at 167; ua + for uti   Cardiac Monitoring:  The patient was maintained on a cardiac monitor.  I personally viewed and interpreted the cardiac monitored which showed an underlying rhythm of: nsr   Medicines ordered and prescription drug management:  I ordered medication including ivfs and keflex  for sx  Reevaluation of the patient after these medicines showed that the patient improved I have reviewed the patients home medicines and have made adjustments as needed    Problem List / ED Course:  FC removal:  pt requested removal of FC, so I did remove it.  I did tell him that if he was unable to urinate, it would have to go back in.  Fortunately, he has been urinating well since fc was removed.  Post void residual was 55.   UTI:  Urine obtained after fc removed.  Urine sent for cx.  Pt started on keflex. He has a uro appt in 2 days.  He knows to keep that appt.   Reevaluation:  After the interventions noted above, I reevaluated the patient and found that they have :improved   Social Determinants of Health:  Lives at home   Dispostion:  After consideration of the diagnostic results and the patients response to  treatment, I feel that the patent would benefit from discharge with outpatient tx..          Final Clinical Impression(s) / ED Diagnoses Final diagnoses:  Encounter for Foley catheter removal  Urinary tract infection associated with indwelling urethral catheter, initial encounter [facility])    Rx / DC Orders ED Discharge Orders          Ordered    cephALEXin (KEFLEX) 500 MG capsule  3 times daily        02/25/23 2300    tamsulosin (FLOMAX) 0.4 MG CAPS capsule  Daily        02/25/23 2300              Jacalyn Lefevre, MD 02/25/23 2308

## 2023-02-25 NOTE — ED Notes (Signed)
Urinal provided, pt aware that urine specimen is needed

## 2023-02-27 LAB — URINE CULTURE

## 2023-03-11 ENCOUNTER — Institutional Professional Consult (permissible substitution): Payer: No Typology Code available for payment source | Admitting: Cardiology

## 2023-05-12 ENCOUNTER — Ambulatory Visit: Payer: Non-veteran care | Attending: Cardiovascular Disease | Admitting: Cardiovascular Disease

## 2023-05-12 ENCOUNTER — Encounter: Payer: Self-pay | Admitting: Cardiovascular Disease

## 2023-05-12 VITALS — BP 129/72 | HR 69 | Ht 70.0 in | Wt 193.0 lb

## 2023-05-12 DIAGNOSIS — I2510 Atherosclerotic heart disease of native coronary artery without angina pectoris: Secondary | ICD-10-CM

## 2023-05-12 DIAGNOSIS — R001 Bradycardia, unspecified: Secondary | ICD-10-CM | POA: Diagnosis not present

## 2023-05-12 DIAGNOSIS — E782 Mixed hyperlipidemia: Secondary | ICD-10-CM | POA: Diagnosis not present

## 2023-05-12 DIAGNOSIS — I10 Essential (primary) hypertension: Secondary | ICD-10-CM | POA: Diagnosis not present

## 2023-05-12 NOTE — Assessment & Plan Note (Addendum)
History of hyperlipidemia on statin therapy followed by the [facility].  His most recent lipid profile performed/8/24 revealed a total cholesterol 128, LDL 38 and HDL of 32.  His triglyceride level was 292.

## 2023-05-12 NOTE — Patient Instructions (Signed)

## 2023-05-12 NOTE — Assessment & Plan Note (Signed)
History of essential blood pressure measured today at 129/72.  His metoprolol was recently discontinued and amlodipine added.  He is currently on amlodipine, and lisinopril.

## 2023-05-12 NOTE — Progress Notes (Signed)
05/12/2023 Joel Stanley   08-09-1940  [id number]  Primary Physician Administration, [facility] Primary Cardiologist: [name] MD FACP, [place], [place], [place]  HPI:  Joel Stanley is a 82 y.o.  mildly to moderately overweight divorced Caucasian male father of 1, grandfather of 2 grandchildren whose care I assumed from Dr. [name]. I last saw him in the office 07/29/2022 .  He is paid by his significant other [name] today.  He is currently retired. 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 His cardiac risk factor profile is positive for remote tobacco abuse, having quit 30 years ago, treated hypertension, hyperlipidemia and non-insulin-requiring diabetes. He does have a family history of heart disease with a father who died of an MI at age 51 and a brother who has CAD as well. He had his first MI in 1997 and multiple stents since that time, beginning in 2006 and 2009. I intervened on him in the setting of unstable angina on July 21, 2010 and re-dilated his PLA stent which had a 75% "in-stent" restenosis and stented his PDA. His EF at that time was 45% with moderate inferobasal hypokinesia. He does get dyspnea but denies chest pain. [facility] [name] [facility] his [facility] work.  He did have a Myoview stress test performed 09/21/13 which was low risk and nonischemic.    He was admitted to the hospital in June 2019 with chest pain.  He underwent diagnostic coronary angiography by myself on 12/14/2017 via the right radial approach revealing an occluded mid LAD which was old with left to left collaterals, patent stents in the PDA and PLA branches.  EF is 45%.     Since I saw him a 9 months ago he continues to do well.  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  He denies chest pain or shortness of breath.  He was having symptomatic bradycardia with heart rates in the low 40s with pauses up to 4.7 seconds.  I did refer him to Dr. [name] who stopped his metoprolol resulting  in marked improvement in his symptoms and increase in his heart rate.  Current Meds  Medication Sig   acetaminophen (TYLENOL) 650 MG CR tablet Take 650 mg by mouth as needed.   amLODipine (NORVASC) 5 MG tablet Take 5 mg by mouth daily.   aspirin 81 MG chewable tablet Chew 1 tablet (81 mg total) by mouth daily. (Patient taking differently: Chew 81 mg by mouth as needed for mild pain (pain score 1-3).)   cephALEXin (KEFLEX) 500 MG capsule Take 1 capsule (500 mg total) by mouth 3 (three) times daily.   Cholecalciferol (VITAMIN D-3) 1000 UNITS CAPS Take 1,000 Units by mouth daily.   clopidogrel (PLAVIX) 75 MG tablet Take 75 mg by mouth daily.   EPINEPHrine 0.3 mg/0.3 mL IJ SOAJ injection    famotidine (PEPCID) 20 MG tablet Take 1 tablet by mouth 2 (two) times daily.   lisinopril (ZESTRIL) 40 MG tablet Take 1 tablet (40 mg total) by mouth daily.   memantine (NAMENDA) 10 MG tablet Take 10 mg by mouth daily.   metFORMIN (GLUCOPHAGE) 500 MG tablet Take 1 tablet (500 mg total) by mouth 2 (two) times daily with a meal. (Patient taking differently: Take 1,000 mg by mouth 2 (two) times daily with a meal.)   nitroGLYCERIN (NITROSTAT) 0.4 MG SL tablet Place 1 tablet (0.4 mg total) under the tongue every 5 (five) minutes x 3 doses  as needed for chest pain.   rosuvastatin (CRESTOR) 10 MG tablet Take 10 mg by mouth every evening.   tamsulosin (FLOMAX) 0.4 MG CAPS capsule Take 1 capsule (0.4 mg total) by mouth daily.     Allergies  Allergen Reactions   Yellow Jacket Venom    Yellow Jacket Venom     Social History   Socioeconomic History   Marital status: Widowed    Spouse name: Not on file   Number of children: Not on file   Years of education: Not on file   Highest education level: Not on file  Occupational History   Not on file  Tobacco Use   Smoking status: Former    Current packs/day: 0.00    Types: Cigarettes    Quit date: 03/02/1973    Years since quitting: 50.2   Smokeless tobacco:  Never  Vaping Use   Vaping status: Never Used  Substance and Sexual Activity   Alcohol use: Yes    Comment: social    Drug use: Never   Sexual activity: Not Currently  Other Topics Concern   Not on file  Social History Narrative   Not on file   Social Determinants of Health   Financial Resource Strain: Not on file  Food Insecurity: No Food Insecurity (02/01/2023)   Hunger Vital Sign    Worried About Running Out of Food in the Last Year: Never true    Ran Out of Food in the Last Year: Never true  Transportation Needs: Patient Declined (02/01/2023)   PRAPARE - Administrator, Civil Service (Medical): Patient declined    Lack of Transportation (Non-Medical): Patient declined  Physical Activity: Not on file  Stress: Not on file  Social Connections: Unknown (09/08/2022)   Received from [facility]   Social Network    Social Network: Not on file  Intimate Partner Violence: Patient Declined (02/01/2023)   Humiliation, Afraid, Rape, and Kick questionnaire    Fear of Current or Ex-Partner: Patient declined    Emotionally Abused: Patient declined    Physically Abused: Patient declined    Sexually Abused: Patient declined     Review of Systems: General: negative for chills, fever, night sweats or weight changes.  Cardiovascular: negative for chest pain, dyspnea on exertion, edema, orthopnea, palpitations, paroxysmal nocturnal dyspnea or shortness of breath Dermatological: negative for rash Respiratory: negative for cough or wheezing Urologic: negative for hematuria Abdominal: negative for nausea, vomiting, diarrhea, bright red blood per rectum, melena, or hematemesis Neurologic: negative for visual changes, syncope, or dizziness All other systems reviewed and are otherwise negative except as noted above.    Blood pressure 129/72, pulse 69, height 5\' 10"  (1.778 m), weight 193 lb (87.5 kg), SpO2 (!) 86%.  General appearance: alert and no distress Neck:  no adenopathy, no carotid bruit, no JVD, supple, symmetrical, trachea midline, and thyroid not enlarged, symmetric, no tenderness/mass/nodules Lungs: clear to auscultation bilaterally Heart: regular rate and rhythm, S1, S2 normal, no murmur, click, rub or gallop Extremities: extremities normal, atraumatic, no cyanosis or edema Pulses: 2+ and symmetric Skin: Skin color, texture, turgor normal. No rashes or lesions Neurologic: Grossly normal  EKG performed today      ASSESSMENT AND PLAN:   CAD (coronary artery disease) History of CAD status post myocardial infarction in 1997 with multiple stents since that time beginning in 2016 2009.  I intervened on him in the setting of unstable angina 07/21/2010 and redilated his PLA stent which had a  75% "in-stent restenosis" and stented his PDA.  I Then began 12/10/2017 revealing an occluded mid LAD which was old, left to left collaterals and patent stents in his PDA and PLA.  His EF was 45% at that time.  He denies chest pain or shortness of breath.  Essential hypertension History of essential blood pressure measured today at 129/72.  His metoprolol was recently discontinued and amlodipine added.  He is currently on amlodipine, and lisinopril.  Hyperlipidemia History of hyperlipidemia on statin therapy followed by the [facility].  His most recent lipid profile performed/8/24 revealed a total cholesterol 128, LDL 38 and HDL of 32.  His triglyceride level was 292.  History of sinus bradycardia with sinus node dysfunction History of sinus bradycardia with pauses with lethargy.  I referred him to Dr. [name] who discontinued his metoprolol resulting in marked improvement in his symptoms and increase in his heart rate.     [name] MD FACP,FACC,FAHA, [facility] 05/12/2023 12:43 PM

## 2023-05-12 NOTE — Assessment & Plan Note (Signed)
History of CAD status post myocardial infarction in 1997 with multiple stents since that time beginning in 2016 2009.  I intervened on him in the setting of unstable angina 07/21/2010 and redilated his PLA stent which had a 75% "in-stent restenosis" and stented his PDA.  I Then began 12/10/2017 revealing an occluded mid LAD which was old, left to left collaterals and patent stents in his PDA and PLA.  His EF was 45% at that time.  He denies chest pain or shortness of breath.

## 2023-05-12 NOTE — Assessment & Plan Note (Signed)
History of sinus bradycardia with pauses with lethargy.  I referred him to Dr. [name] who discontinued his metoprolol resulting in marked improvement in his symptoms and increase in his heart rate.

## 2023-05-31 ENCOUNTER — Encounter (HOSPITAL_COMMUNITY): Payer: Self-pay

## 2023-05-31 ENCOUNTER — Emergency Department (HOSPITAL_COMMUNITY): Payer: No Typology Code available for payment source

## 2023-05-31 ENCOUNTER — Other Ambulatory Visit: Payer: Self-pay

## 2023-05-31 ENCOUNTER — Emergency Department (HOSPITAL_COMMUNITY)
Admission: EM | Admit: 2023-05-31 | Discharge: 2023-06-01 | Disposition: A | Discharge status: Home / Self Care | Payer: No Typology Code available for payment source | Attending: Emergency Medicine | Admitting: Emergency Medicine

## 2023-05-31 DIAGNOSIS — R55 Syncope and collapse: Secondary | ICD-10-CM | POA: Diagnosis present

## 2023-05-31 DIAGNOSIS — E860 Dehydration: Secondary | ICD-10-CM | POA: Diagnosis not present

## 2023-05-31 DIAGNOSIS — R000 Tachycardia, unspecified: Secondary | ICD-10-CM | POA: Diagnosis not present

## 2023-05-31 DIAGNOSIS — Z7982 Long term (current) use of aspirin: Secondary | ICD-10-CM | POA: Diagnosis not present

## 2023-05-31 DIAGNOSIS — Z7902 Long term (current) use of antithrombotics/antiplatelets: Secondary | ICD-10-CM | POA: Diagnosis not present

## 2023-05-31 LAB — CBC WITH DIFFERENTIAL/PLATELET
Abs Immature Granulocytes: 0.03 10*3/uL (ref 0.00–0.07)
Basophils Absolute: 0 10*3/uL (ref 0.0–0.1)
Basophils Relative: 0 %
Eosinophils Absolute: 0 10*3/uL (ref 0.0–0.5)
Eosinophils Relative: 1 %
HCT: 42.1 % (ref 39.0–52.0)
Hemoglobin: 13.5 g/dL (ref 13.0–17.0)
Immature Granulocytes: 1 %
Lymphocytes Relative: 6 %
Lymphs Abs: 0.4 10*3/uL — ABNORMAL LOW (ref 0.7–4.0)
MCH: 29.5 pg (ref 26.0–34.0)
MCHC: 32.1 g/dL (ref 30.0–36.0)
MCV: 92.1 fL (ref 80.0–100.0)
Monocytes Absolute: 0.5 10*3/uL (ref 0.1–1.0)
Monocytes Relative: 7 %
Neutro Abs: 5.6 10*3/uL (ref 1.7–7.7)
Neutrophils Relative %: 85 %
Platelets: 245 10*3/uL (ref 150–400)
RBC: 4.57 MIL/uL (ref 4.22–5.81)
RDW: 13.1 % (ref 11.5–15.5)
WBC: 6.6 10*3/uL (ref 4.0–10.5)
nRBC: 0 % (ref 0.0–0.2)

## 2023-05-31 LAB — I-STAT CG4 LACTIC ACID, ED: Lactic Acid, Venous: 1.9 mmol/L (ref 0.5–1.9)

## 2023-05-31 NOTE — ED Provider Notes (Signed)
[place] EMERGENCY DEPARTMENT AT [facility] Provider Note   CSN: [id number] Arrival date & time: 05/31/23  2313     History {Add pertinent medical, surgical, social history, OB history to HPI:1} Chief Complaint  Patient presents with   Loss of Consciousness    Joel Stanley is a 82 y.o. male.  Patient presents to the emergency department by ambulance from home.  Patient had a syncopal episode tonight.  He reports that he has been feeling weak for "some time", but it got worse tonight.  He thought that the weakness might be secondary to low blood sugar, tried to eat a meal but then vomited.  Patient passed out immediately after.  EMS report low blood pressure upon their arrival, 80 systolic.  Patient given IV fluids with some improvement.  Patient reports that the episode of vomiting was the first time, no ongoing vomiting or diarrhea.  Denies chest pain, shortness of breath.  No focal stroke symptoms.       Home Medications Prior to Admission medications   Medication Sig Start Date End Date Taking? Authorizing Provider  acetaminophen (TYLENOL) 650 MG CR tablet Take 650 mg by mouth as needed.    [provider]  amLODipine (NORVASC) 5 MG tablet Take 5 mg by mouth daily. 03/18/23   [provider]  aspirin 81 MG chewable tablet Chew 1 tablet (81 mg total) by mouth daily. Patient taking differently: Chew 81 mg by mouth as needed for mild pain (pain score 1-3). 12/15/17   Joseph Art, DO  cephALEXin (KEFLEX) 500 MG capsule Take 1 capsule (500 mg total) by mouth 3 (three) times daily. 02/25/23   Jacalyn Lefevre, MD  Cholecalciferol (VITAMIN D-3) 1000 UNITS CAPS Take 1,000 Units by mouth daily.    [provider]  clopidogrel (PLAVIX) 75 MG tablet Take 75 mg by mouth daily.    [provider]  EPINEPHrine 0.3 mg/0.3 mL IJ SOAJ injection  08/02/20   [provider]  famotidine (PEPCID) 20 MG tablet Take 1 tablet by mouth 2 (two)  times daily. 08/02/20   [provider]  lisinopril (ZESTRIL) 40 MG tablet Take 1 tablet (40 mg total) by mouth daily. 06/17/20   Azucena Fallen, MD  memantine (NAMENDA) 10 MG tablet Take 10 mg by mouth daily. 07/02/22   [provider]  metFORMIN (GLUCOPHAGE) 500 MG tablet Take 1 tablet (500 mg total) by mouth 2 (two) times daily with a meal. Patient taking differently: Take 1,000 mg by mouth 2 (two) times daily with a meal. 12/17/17   Joseph Art, DO  nitroGLYCERIN (NITROSTAT) 0.4 MG SL tablet Place 1 tablet (0.4 mg total) under the tongue every 5 (five) minutes x 3 doses as needed for chest pain. 07/29/22   [name], MD  rosuvastatin (CRESTOR) 10 MG tablet Take 10 mg by mouth every evening.    [provider]  tamsulosin (FLOMAX) 0.4 MG CAPS capsule Take 1 capsule (0.4 mg total) by mouth daily. 02/25/23   Jacalyn Lefevre, MD      Allergies    Yellow jacket venom and Yellow jacket venom    Review of Systems   Review of Systems  Physical Exam Updated Vital Signs There were no vitals taken for this visit. Physical Exam Vitals and nursing note reviewed.  Constitutional:      General: He is not in acute distress.    Appearance: He is well-developed.  HENT:     Head:  Normocephalic and atraumatic.     Mouth/Throat:     Mouth: Mucous membranes are moist.  Eyes:     General: Vision grossly intact. Gaze aligned appropriately.     Extraocular Movements: Extraocular movements intact.     Conjunctiva/sclera: Conjunctivae normal.  Cardiovascular:     Rate and Rhythm: Regular rhythm. Tachycardia present.     Pulses: Normal pulses.     Heart sounds: Normal heart sounds, S1 normal and S2 normal. No murmur heard.    No friction rub. No gallop.  Pulmonary:     Effort: Pulmonary effort is normal. No respiratory distress.     Breath sounds: Normal breath sounds.  Abdominal:     Palpations: Abdomen is soft.     Tenderness: There is no abdominal  tenderness. There is no guarding or rebound.     Hernia: No hernia is present.  Musculoskeletal:        General: No swelling.     Cervical back: Full passive range of motion without pain, normal range of motion and neck supple. No pain with movement, spinous process tenderness or muscular tenderness. Normal range of motion.     Right lower leg: No edema.     Left lower leg: No edema.  Skin:    General: Skin is warm and dry.     Capillary Refill: Capillary refill takes less than 2 seconds.     Findings: No ecchymosis, erythema, lesion or wound.  Neurological:     Mental Status: He is alert and oriented to person, place, and time.     GCS: GCS eye subscore is 4. GCS verbal subscore is 5. GCS motor subscore is 6.     Cranial Nerves: Cranial nerves 2-12 are intact.     Sensory: Sensation is intact.     Motor: Motor function is intact. No weakness or abnormal muscle tone.     Coordination: Coordination is intact.  Psychiatric:        Mood and Affect: Mood normal.        Speech: Speech normal.        Behavior: Behavior normal.     ED Results / Procedures / Treatments   Labs (all labs ordered are listed, but only abnormal results are displayed) Labs Reviewed - No data to display  EKG None  Radiology No results found.  Procedures Procedures  {Document cardiac monitor, telemetry assessment procedure when appropriate:1}  Medications Ordered in ED Medications - No data to display  ED Course/ Medical Decision Making/ A&P   {   Click here for ABCD2, HEART and other calculatorsREFRESH Note before signing :1}                              Medical Decision Making  ***  {Document critical care time when appropriate:1} {Document review of labs and clinical decision tools ie heart score, Chads2Vasc2 etc:1}  {Document your independent review of radiology images, and any outside records:1} {Document your discussion with family members, caretakers, and with consultants:1} {Document  social determinants of health affecting pt's care:1} {Document your decision making why or why not admission, treatments were needed:1} Final Clinical Impression(s) / ED Diagnoses Final diagnoses:  None    Rx / DC Orders ED Discharge Orders     None

## 2023-05-31 NOTE — ED Triage Notes (Signed)
Pt BIB [facility] d/t syncope with fall - witnessed fall on bottom - on Plavix.  Pt felt weak - ate and vomited then had syncope.  Pt has BP 80 sys but givem 500 cc NS and 4 mg Zofran.

## 2023-06-01 LAB — URINALYSIS, W/ REFLEX TO CULTURE (INFECTION SUSPECTED)
Bilirubin Urine: NEGATIVE
Glucose, UA: NEGATIVE mg/dL
Hgb urine dipstick: NEGATIVE
Ketones, ur: NEGATIVE mg/dL
Nitrite: NEGATIVE
Protein, ur: 30 mg/dL — AB
Specific Gravity, Urine: 1.027 (ref 1.005–1.030)
pH: 5 (ref 5.0–8.0)

## 2023-06-01 LAB — COMPREHENSIVE METABOLIC PANEL
ALT: 15 U/L (ref 0–44)
AST: 15 U/L (ref 15–41)
Albumin: 3.6 g/dL (ref 3.5–5.0)
Alkaline Phosphatase: 52 U/L (ref 38–126)
Anion gap: 10 (ref 5–15)
BUN: 20 mg/dL (ref 8–23)
CO2: 21 mmol/L — ABNORMAL LOW (ref 22–32)
Calcium: 8.5 mg/dL — ABNORMAL LOW (ref 8.9–10.3)
Chloride: 108 mmol/L (ref 98–111)
Creatinine, Ser: 1.51 mg/dL — ABNORMAL HIGH (ref 0.61–1.24)
GFR, Estimated: 46 mL/min — ABNORMAL LOW (ref >60)
Glucose, Bld: 208 mg/dL — ABNORMAL HIGH (ref 70–99)
Potassium: 4.8 mmol/L (ref 3.5–5.1)
Sodium: 139 mmol/L (ref 135–145)
Total Bilirubin: 1.1 mg/dL (ref <1.2)
Total Protein: 6.8 g/dL (ref 6.5–8.1)

## 2023-06-01 LAB — TROPONIN I (HIGH SENSITIVITY)
Troponin I (High Sensitivity): 10 ng/L (ref <18)
Troponin I (High Sensitivity): 9 ng/L (ref <18)

## 2023-06-01 LAB — I-STAT CG4 LACTIC ACID, ED: Lactic Acid, Venous: 1.7 mmol/L (ref 0.5–1.9)

## 2023-06-01 MED ORDER — SODIUM CHLORIDE 0.9 % IV BOLUS
500.0000 mL | Freq: Once | INTRAVENOUS | Status: AC
  Administered 2023-06-01: 500 mL via INTRAVENOUS

## 2023-06-01 MED ORDER — FOSFOMYCIN TROMETHAMINE 3 G PO PACK
3.0000 g | PACK | Freq: Once | ORAL | Status: AC
  Administered 2023-06-01: 3 g via ORAL
  Filled 2023-06-01: qty 3

## 2023-06-01 NOTE — ED Notes (Signed)
Pt refusing straight cath, attempting to use urinal.

## 2023-06-01 NOTE — ED Notes (Signed)
Pt drinking water, says he can not urinated because he hasn't had anything to drink

## 2023-06-01 NOTE — ED Notes (Signed)
Pt given water to drink and will attempt to use urinal.

## 2023-06-03 LAB — URINE CULTURE: Culture: >=100000 — AB

## 2023-06-04 ENCOUNTER — Telehealth (HOSPITAL_BASED_OUTPATIENT_CLINIC_OR_DEPARTMENT_OTHER): Payer: Self-pay | Admitting: *Deleted

## 2023-06-04 NOTE — Telephone Encounter (Signed)
Post ED Visit - Positive Culture Follow-up  Culture report reviewed by antimicrobial stewardship pharmacist: [facility] [x]  Daylene Posey Pharm.D. []  Celedonio Miyamoto, Pharm.D., BCPS AQ-ID []  Garvin Fila, Pharm.D., BCPS []  Georgina Pillion, Pharm.D., BCPS []  [place], [address].D., BCPS, AAHIVP []  Estella Husk, Pharm.D., BCPS, AAHIVP []  Lysle Pearl, PharmD, BCPS []  Phillips Climes, PharmD, BCPS []  Agapito Games, PharmD, BCPS []  Verlan Friends, PharmD []  Mervyn Gay, PharmD, BCPS []  Vinnie Level, PharmD  [facility] []  Len Childs, PharmD []  Greer Pickerel, PharmD []  Adalberto Cole, PharmD []  Perlie Gold, Rph []  Lonell Face) Jean Rosenthal, PharmD []  Earl Many, PharmD []  Junita Push, PharmD []  Dorna Leitz, PharmD []  Terrilee Files, PharmD []  Lynann Beaver, PharmD []  Keturah Barre, PharmD []  Loralee Pacas, PharmD []  Bernadene Person, PharmD   Positive urine culture Treated with Fosfomycin x 1 in the ED, organism sensitive to the same and no further patient follow-up is required at this time.  Virl Axe [facility] 06/04/2023, 9:02 AM
